# Patient Record
Sex: Male | Born: 1945 | Race: White | Hispanic: No | Marital: Married | Smoking: Former smoker
Health system: Southern US, Community
[De-identification: ages and names within clinical notes are randomized; demographics above are authoritative.]

## PROBLEM LIST (undated history)

## (undated) ENCOUNTER — Telehealth

## (undated) ENCOUNTER — Ambulatory Visit

## (undated) ENCOUNTER — Ambulatory Visit: Payer: Medicare (Managed Care)

## (undated) ENCOUNTER — Encounter

## (undated) ENCOUNTER — Ambulatory Visit: Payer: PRIVATE HEALTH INSURANCE

## (undated) ENCOUNTER — Encounter: Attending: Dermatology | Primary: Dermatology

## (undated) ENCOUNTER — Ambulatory Visit: Attending: Dermatology | Primary: Dermatology

## (undated) ENCOUNTER — Telehealth: Attending: Dermatology | Primary: Dermatology

## (undated) ENCOUNTER — Ambulatory Visit: Payer: PRIVATE HEALTH INSURANCE | Attending: Dermatology | Primary: Dermatology

## (undated) ENCOUNTER — Ambulatory Visit
Attending: Student in an Organized Health Care Education/Training Program | Primary: Student in an Organized Health Care Education/Training Program

## (undated) ENCOUNTER — Encounter: Attending: Hematology & Oncology | Primary: Hematology & Oncology

## (undated) ENCOUNTER — Encounter: Attending: Oncology | Primary: Oncology

## (undated) ENCOUNTER — Ambulatory Visit: Payer: Medicare (Managed Care) | Attending: Dermatology | Primary: Dermatology

## (undated) ENCOUNTER — Encounter
Attending: Student in an Organized Health Care Education/Training Program | Primary: Student in an Organized Health Care Education/Training Program

## (undated) ENCOUNTER — Ambulatory Visit: Payer: MEDICARE | Attending: Dermatology | Primary: Dermatology

## (undated) ENCOUNTER — Ambulatory Visit: Payer: Medicare (Managed Care) | Attending: Hematology & Oncology | Primary: Hematology & Oncology

## (undated) DIAGNOSIS — F419 Anxiety disorder, unspecified: Secondary | ICD-10-CM

## (undated) DIAGNOSIS — C801 Malignant (primary) neoplasm, unspecified: Secondary | ICD-10-CM

## (undated) DIAGNOSIS — I2510 Atherosclerotic heart disease of native coronary artery without angina pectoris: Secondary | ICD-10-CM

## (undated) DIAGNOSIS — G4730 Sleep apnea, unspecified: Secondary | ICD-10-CM

## (undated) DIAGNOSIS — M1990 Unspecified osteoarthritis, unspecified site: Secondary | ICD-10-CM

## (undated) DIAGNOSIS — J189 Pneumonia, unspecified organism: Secondary | ICD-10-CM

## (undated) DIAGNOSIS — E7800 Pure hypercholesterolemia, unspecified: Secondary | ICD-10-CM

## (undated) DIAGNOSIS — C8400 Mycosis fungoides, unspecified site: Secondary | ICD-10-CM

## (undated) DIAGNOSIS — I10 Essential (primary) hypertension: Secondary | ICD-10-CM

## (undated) DIAGNOSIS — K219 Gastro-esophageal reflux disease without esophagitis: Secondary | ICD-10-CM

## (undated) HISTORY — DX: Pure hypercholesterolemia, unspecified: E78.00

## (undated) HISTORY — DX: Gastro-esophageal reflux disease without esophagitis: K21.9

## (undated) HISTORY — PX: CARDIAC CATHETERIZATION: SHX172

## (undated) HISTORY — PX: BACK SURGERY: SHX140

## (undated) HISTORY — DX: Essential (primary) hypertension: I10

## (undated) HISTORY — DX: Atherosclerotic heart disease of native coronary artery without angina pectoris: I25.10

## (undated) HISTORY — PX: CORONARY ANGIOPLASTY WITH STENT PLACEMENT: SHX49

## (undated) HISTORY — DX: Unspecified osteoarthritis, unspecified site: M19.90

## (undated) HISTORY — PX: EYE SURGERY: SHX253

## (undated) HISTORY — PX: TONSILLECTOMY: SUR1361

---

## 1993-09-28 HISTORY — PX: OTHER SURGICAL HISTORY: SHX169

## 2006-03-18 ENCOUNTER — Ambulatory Visit: Payer: Self-pay | Admitting: Internal Medicine

## 2007-09-13 ENCOUNTER — Ambulatory Visit: Payer: Self-pay | Admitting: Gastroenterology

## 2007-09-29 HISTORY — PX: LUMBAR FUSION: SHX111

## 2008-04-18 ENCOUNTER — Emergency Department: Payer: Self-pay | Admitting: Emergency Medicine

## 2009-06-15 ENCOUNTER — Other Ambulatory Visit: Payer: Self-pay | Admitting: Internal Medicine

## 2009-09-17 ENCOUNTER — Encounter: Admission: RE | Admit: 2009-09-17 | Discharge: 2009-09-17 | Payer: Self-pay | Admitting: Neurological Surgery

## 2009-10-28 ENCOUNTER — Inpatient Hospital Stay (HOSPITAL_COMMUNITY): Admission: RE | Admit: 2009-10-28 | Discharge: 2009-10-31 | Payer: Self-pay | Admitting: Neurological Surgery

## 2009-10-28 HISTORY — PX: LUMBAR FUSION: SHX111

## 2010-01-04 ENCOUNTER — Encounter: Admission: RE | Admit: 2010-01-04 | Discharge: 2010-01-04 | Payer: Self-pay | Admitting: Sports Medicine

## 2010-12-15 LAB — BASIC METABOLIC PANEL
BUN: 16 mg/dL (ref 6–23)
CO2: 30 mEq/L (ref 19–32)
Creatinine, Ser: 1.03 mg/dL (ref 0.4–1.5)
GFR calc Af Amer: >60 mL/min (ref >60)
Glucose, Bld: 46 mg/dL — ABNORMAL LOW (ref 70–99)
Sodium: 142 mEq/L (ref 135–145)

## 2010-12-15 LAB — TYPE AND SCREEN: ABO/RH(D): O NEG

## 2010-12-15 LAB — CBC
HCT: 43.9 % (ref 39.0–52.0)
RBC: 4.48 MIL/uL (ref 4.22–5.81)
WBC: 7 10*3/uL (ref 4.0–10.5)

## 2010-12-15 LAB — ABO/RH: ABO/RH(D): O NEG

## 2011-02-24 ENCOUNTER — Observation Stay: Payer: Self-pay | Admitting: Internal Medicine

## 2011-04-28 ENCOUNTER — Encounter: Payer: Self-pay | Admitting: Podiatry

## 2011-04-28 DIAGNOSIS — M722 Plantar fascial fibromatosis: Secondary | ICD-10-CM

## 2011-07-03 ENCOUNTER — Ambulatory Visit: Payer: Self-pay | Admitting: Internal Medicine

## 2012-05-03 DIAGNOSIS — Z9889 Other specified postprocedural states: Secondary | ICD-10-CM | POA: Insufficient documentation

## 2012-05-03 DIAGNOSIS — M4322 Fusion of spine, cervical region: Secondary | ICD-10-CM | POA: Insufficient documentation

## 2012-05-03 DIAGNOSIS — Z955 Presence of coronary angioplasty implant and graft: Secondary | ICD-10-CM | POA: Insufficient documentation

## 2012-05-03 DIAGNOSIS — E785 Hyperlipidemia, unspecified: Secondary | ICD-10-CM | POA: Insufficient documentation

## 2012-05-24 DIAGNOSIS — C8408 Mycosis fungoides, lymph nodes of multiple sites: Secondary | ICD-10-CM | POA: Insufficient documentation

## 2012-05-24 DIAGNOSIS — C8400 Mycosis fungoides, unspecified site: Secondary | ICD-10-CM | POA: Insufficient documentation

## 2014-02-20 ENCOUNTER — Ambulatory Visit: Payer: Self-pay | Admitting: Internal Medicine

## 2014-03-12 DIAGNOSIS — M1990 Unspecified osteoarthritis, unspecified site: Secondary | ICD-10-CM | POA: Insufficient documentation

## 2014-09-10 ENCOUNTER — Ambulatory Visit: Payer: Self-pay | Admitting: Internal Medicine

## 2014-11-08 ENCOUNTER — Ambulatory Visit: Payer: Self-pay | Admitting: Gastroenterology

## 2014-12-25 ENCOUNTER — Ambulatory Visit: Payer: Self-pay | Admitting: Gastroenterology

## 2015-01-21 LAB — SURGICAL PATHOLOGY

## 2015-02-20 DIAGNOSIS — I2510 Atherosclerotic heart disease of native coronary artery without angina pectoris: Secondary | ICD-10-CM | POA: Insufficient documentation

## 2015-04-24 ENCOUNTER — Ambulatory Visit: Payer: Self-pay | Admitting: Urology

## 2015-04-29 ENCOUNTER — Ambulatory Visit (INDEPENDENT_AMBULATORY_CARE_PROVIDER_SITE_OTHER): Payer: PPO | Admitting: Urology

## 2015-04-29 ENCOUNTER — Encounter: Payer: Self-pay | Admitting: Urology

## 2015-04-29 VITALS — BP 120/55 | HR 60 | Ht 68.0 in | Wt 207.4 lb

## 2015-04-29 DIAGNOSIS — N401 Enlarged prostate with lower urinary tract symptoms: Secondary | ICD-10-CM

## 2015-04-29 DIAGNOSIS — R312 Other microscopic hematuria: Secondary | ICD-10-CM

## 2015-04-29 DIAGNOSIS — N390 Urinary tract infection, site not specified: Secondary | ICD-10-CM | POA: Diagnosis not present

## 2015-04-29 DIAGNOSIS — N138 Other obstructive and reflux uropathy: Secondary | ICD-10-CM

## 2015-04-29 DIAGNOSIS — R3129 Other microscopic hematuria: Secondary | ICD-10-CM

## 2015-04-29 LAB — URINALYSIS, COMPLETE
Bilirubin, UA: NEGATIVE
Glucose, UA: NEGATIVE
Ketones, UA: NEGATIVE
Leukocytes, UA: NEGATIVE
Nitrite, UA: NEGATIVE
PROTEIN UA: NEGATIVE
RBC UA: NEGATIVE
Specific Gravity, UA: >1.03 — ABNORMAL HIGH (ref 1.005–1.030)
UUROB: 0.2 mg/dL (ref 0.2–1.0)
pH, UA: 5 (ref 5.0–7.5)

## 2015-04-29 LAB — MICROSCOPIC EXAMINATION: BACTERIA UA: NONE SEEN

## 2015-04-29 LAB — BLADDER SCAN AMB NON-IMAGING: SCAN RESULT: 0

## 2015-04-29 NOTE — Progress Notes (Signed)
04/29/2015 3:17 PM   Manuel Cook January 05, 1946 [id number]  Referring provider: No referring provider defined for this encounter.  Chief Complaint  Patient presents with  . Prostatitis    referral from Dr. Ginette Pitman    HPI: Manuel Cook is a 69 year old white male who had the sudden onset of intense dysuria 6 weeks ago.  He was also experiencing chills, frequency and urgency at that time. His urine had a foul-smelling odor to it. His nocturia increased from 2-3 times a night to up to 5 times a night. He was also having some urinary incontinence.  He denies any gross hematuria, fevers, nausea, vomiting or diarrhea.   He was seen at [facility] walk in clinic on 03/28/2015.  Patient was started on Cipro and urine was sent for culture. It grew out Escherichia coli which was sensitive to the Cipro. He completed 10 days of antibiotics.  He was still having frequency and mild dysuria, so he followed up with his PCP's office and given tamsulosin and cefuroxime.  He states he is 90% back to normal.  He had a similar symptomatology 5 years ago and he did not address it  immediately and he developed an acute prostatitis and spent 3 days in the hospital on IV antibiotics and he did not want to experience the same sequela.       IPSS      04/29/15 1500       International Prostate Symptom Score   How often have you had the sensation of not emptying your bladder? About half the time     How often have you had to urinate less than every two hours? Less than half the time     How often have you found you stopped and started again several times when you urinated? Less than 1 in 5 times     How often have you found it difficult to postpone urination? More than half the time     How often have you had a weak urinary stream? Less than half the time     How often have you had to strain to start urination? Not at All     How many times did you typically get up at night to urinate? 3 Times     Total IPSS Score 15      Quality of Life due to urinary symptoms   If you were to spend the rest of your life with your urinary condition just the way it is now how would you feel about that? Mixed        Score:  1-7 Mild 8-19 Moderate 20-35 Severe  PMH: Past Medical History  Diagnosis Date  . High cholesterol   . Hypertension   . Acid reflux   . Arthritis   . CAD (coronary artery disease)     stents    Surgical History: Past Surgical History  Procedure Laterality Date  . Lumbar fusion  2009  . Neck fusion  1995    Home Medications:    Medication List       This list is accurate as of: 04/29/15  3:17 PM.  Always use your most recent med list.               ALFUZOSIN HCL PO  Take by mouth.     aspirin 81 MG EC tablet  Take 81 mg by mouth daily.     ATENOLOL PO  Take by mouth.  cefUROXime 500 MG tablet  Commonly known as:  CEFTIN  Take by mouth.     clobetasol cream 0.05 %  Commonly known as:  TEMOVATE  Apply a thin film to lesion on abdomen every other day not using topical nitrogen mustard     Diclofenac-Misoprostol 75-0.2 MG Tbec  Take by mouth.     Fish Oil Oil  by Does not apply route.     LIPITOR PO  Take by mouth.     misoprostol 200 MCG tablet  Commonly known as:  CYTOTEC  Take by mouth.     Omega 3 1000 MG Caps  by Does not apply route.     tamsulosin 0.4 MG Caps capsule  Commonly known as:  FLOMAX  Take by mouth.     triamcinolone cream 0.1 %  Commonly known as:  KENALOG  Apply to lesions twice a day, 1 week off and repeat cycle as necessary        Allergies:  Allergies  Allergen Reactions  . Sulfa Antibiotics     Family History: Family History  Problem Relation Age of Onset  . Kidney failure Brother   . Heart attack Father     Brother  . Diabetes Mellitus II Brother   . Prostate cancer Neg Hx     Social History:  reports that he has quit smoking. He does not have any smokeless tobacco history on file. He reports that he drinks  alcohol. He reports that he does not use illicit drugs.  ROS: UROLOGY Frequent Urination?: Yes Hard to postpone urination?: Yes Burning/pain with urination?: Yes Get up at night to urinate?: Yes Leakage of urine?: Yes Urine stream starts and stops?: No Trouble starting stream?: No Do you have to strain to urinate?: No Blood in urine?: No Urinary tract infection?: Yes Sexually transmitted disease?: No Injury to kidneys or bladder?: No Painful intercourse?: No Weak stream?: No Erection problems?: Yes Penile pain?: No  Gastrointestinal Nausea?: No Vomiting?: No Indigestion/heartburn?: No Diarrhea?: Yes Constipation?: No  Constitutional Fever: No Night sweats?: No Weight loss?: No Fatigue?: No  Skin Skin rash/lesions?: No Itching?: No  Eyes Blurred vision?: No Double vision?: No  Ears/Nose/Throat Sore throat?: No Sinus problems?: No  Hematologic/Lymphatic Swollen glands?: No Easy bruising?: No  Cardiovascular Leg swelling?: No Chest pain?: No  Respiratory Cough?: No Shortness of breath?: No  Endocrine Excessive thirst?: No  Musculoskeletal Back pain?: No Joint pain?: No  Neurological Headaches?: No Dizziness?: No  Psychologic Depression?: No Anxiety?: No  Physical Exam: BP 120/55 mmHg  Pulse 60  Ht 5\' 8"  (1.727 m)  Wt 207 lb 6.4 oz (94.076 kg)  BMI 31.54 kg/m2  Constitutional:  Alert and oriented, No acute distress. HEENT: [place] AT, moist mucus membranes.  Trachea midline, no masses. Cardiovascular: No clubbing, cyanosis, or edema. Respiratory: Normal respiratory effort, no increased work of breathing. GI: Abdomen is soft, nontender, nondistended, no abdominal masses GU: No CVA tenderness. GU: Patient with circumcised phallus.  Urethral meatus is patent.  No penile discharge. No penile lesions or rashes. Scrotum without lesions, cysts, rashes and/or edema.  Testicles are located scrotally bilaterally. No masses are appreciated in the  testicles. Left and right epididymis are normal. Rectal: Patient with  normal sphincter tone. Perineum without scarring or rashes. No rectal masses are appreciated. Prostate is approximately 45 grams, no nodules are appreciated. Seminal vesicles are normal. Skin: No rashes, bruises or suspicious lesions. Lymph: No cervical or inguinal adenopathy. Neurologic: Grossly intact, no focal deficits,  moving all 4 extremities. Psychiatric: Normal mood and affect.  Laboratory Data: Results for orders placed or performed in visit on 04/29/15  CULTURE, URINE COMPREHENSIVE  Result Value Ref Range   Urine Culture, Comprehensive Final report    Result 1 Comment   GC/Chlamydia Probe Amp  Result Value Ref Range   Chlamydia trachomatis, NAA Negative Negative   Neisseria gonorrhoeae by PCR Negative Negative   PLEASE NOTE: Comment   Microscopic Examination  Result Value Ref Range   WBC, UA 0-5 0 -  5 /hpf   RBC, UA 0-2 0 -  2 /hpf   Epithelial Cells (non renal) 0-10 0 - 10 /hpf   Casts Present (A) None seen /lpf   Cast Type Hyaline casts N/A   Mucus, UA Present (A) Not Estab.   Bacteria, UA None seen None seen/Few  Urinalysis, Complete  Result Value Ref Range   Specific Gravity, UA >1.030 (H) 1.005 - 1.030   pH, UA 5.0 5.0 - 7.5   Color, UA Yellow Yellow   Appearance Ur Clear Clear   Leukocytes, UA Negative Negative   Protein, UA Negative Negative/Trace   Glucose, UA Negative Negative   Ketones, UA Negative Negative   RBC, UA Negative Negative   Bilirubin, UA Negative Negative   Urobilinogen, Ur 0.2 0.2 - 1.0 mg/dL   Nitrite, UA Negative Negative   Microscopic Examination See below:   BLADDER SCAN AMB NON-IMAGING  Result Value Ref Range   Scan Result 0    Lab Results  Component Value Date   WBC 7.0 10/23/2009   HGB 15.4 10/23/2009   HCT 43.9 10/23/2009   MCV 97.9 10/23/2009   PLT 162 10/23/2009    Lab Results  Component Value Date   CREATININE 1.03 10/23/2009    No results  found for: PSA  No results found for: TESTOSTERONE  No results found for: HGBA1C  Urinalysis No results found for: COLORURINE, APPEARANCEUR, LABSPEC, PHURINE, GLUCOSEU, HGBUR, BILIRUBINUR, KETONESUR, PROTEINUR, UROBILINOGEN, NITRITE, LEUKOCYTESUR  Pertinent Imaging:   Assessment & Plan:    1. UTI:   Patient had a pan sensitive UTI + for E.coli in June 2016.  His UA is unremarkable and he is feeling a 90% improvement.  - Urinalysis, Complete - CULTURE, URINE COMPREHENSIVE - GC/Chlamydia Probe Amp - BLADDER SCAN AMB NON-IMAGING  2. BPH with LUTS:   Patient's IPSS is 15/3.  His PVR is 0 mL.  He does not want to continue the tamsulosin because of the sexual side effects he experienced while on the medication.    I will have him return in 3 months for a IPSS, PVR and PSA to ensure his residual remains low without medication and his UA remains clear.  3. Microscopic hematuria:   Patient had 10-50 RBC's/hpf on 03/28/2015, but he had infection at that time.  His repeated UA on 04/10/2015 and today's was negative for micro heme.  We will continue to monitor and will check his UA when he returns in three months.   No Follow-up on file.  Zara Council, [facility] [address] [place], [place] [address] [phone]

## 2015-04-30 LAB — GC/CHLAMYDIA PROBE AMP
Chlamydia trachomatis, NAA: NEGATIVE
Neisseria gonorrhoeae by PCR: NEGATIVE

## 2015-05-01 DIAGNOSIS — N401 Enlarged prostate with lower urinary tract symptoms: Secondary | ICD-10-CM

## 2015-05-01 DIAGNOSIS — N138 Other obstructive and reflux uropathy: Secondary | ICD-10-CM | POA: Insufficient documentation

## 2015-05-01 DIAGNOSIS — N390 Urinary tract infection, site not specified: Secondary | ICD-10-CM | POA: Insufficient documentation

## 2015-05-01 DIAGNOSIS — R3129 Other microscopic hematuria: Secondary | ICD-10-CM | POA: Insufficient documentation

## 2015-05-01 LAB — CULTURE, URINE COMPREHENSIVE

## 2015-07-22 ENCOUNTER — Other Ambulatory Visit: Payer: PPO

## 2015-07-30 ENCOUNTER — Ambulatory Visit: Payer: PPO | Admitting: Urology

## 2015-07-31 ENCOUNTER — Ambulatory Visit: Payer: PPO | Admitting: Urology

## 2015-09-04 ENCOUNTER — Other Ambulatory Visit: Payer: Self-pay | Admitting: Internal Medicine

## 2015-09-04 DIAGNOSIS — R918 Other nonspecific abnormal finding of lung field: Secondary | ICD-10-CM

## 2015-09-11 ENCOUNTER — Ambulatory Visit
Admission: RE | Admit: 2015-09-11 | Discharge: 2015-09-11 | Disposition: A | Discharge status: Home / Self Care | Payer: PPO | Source: Ambulatory Visit | Attending: Internal Medicine | Admitting: Internal Medicine

## 2015-09-11 DIAGNOSIS — I2510 Atherosclerotic heart disease of native coronary artery without angina pectoris: Secondary | ICD-10-CM | POA: Insufficient documentation

## 2015-09-11 DIAGNOSIS — R918 Other nonspecific abnormal finding of lung field: Secondary | ICD-10-CM | POA: Diagnosis not present

## 2015-09-11 MED ORDER — IOHEXOL 300 MG/ML  SOLN
75.0000 mL | Freq: Once | INTRAMUSCULAR | Status: AC | PRN
  Administered 2015-09-11: 75 mL via INTRAVENOUS

## 2015-10-08 DIAGNOSIS — M9903 Segmental and somatic dysfunction of lumbar region: Secondary | ICD-10-CM | POA: Diagnosis not present

## 2015-10-08 DIAGNOSIS — M9905 Segmental and somatic dysfunction of pelvic region: Secondary | ICD-10-CM | POA: Diagnosis not present

## 2015-10-08 DIAGNOSIS — M5416 Radiculopathy, lumbar region: Secondary | ICD-10-CM | POA: Diagnosis not present

## 2015-10-08 DIAGNOSIS — M9902 Segmental and somatic dysfunction of thoracic region: Secondary | ICD-10-CM | POA: Diagnosis not present

## 2015-10-28 DIAGNOSIS — M9905 Segmental and somatic dysfunction of pelvic region: Secondary | ICD-10-CM | POA: Diagnosis not present

## 2015-10-28 DIAGNOSIS — M5416 Radiculopathy, lumbar region: Secondary | ICD-10-CM | POA: Diagnosis not present

## 2015-10-28 DIAGNOSIS — M9902 Segmental and somatic dysfunction of thoracic region: Secondary | ICD-10-CM | POA: Diagnosis not present

## 2015-10-28 DIAGNOSIS — M9903 Segmental and somatic dysfunction of lumbar region: Secondary | ICD-10-CM | POA: Diagnosis not present

## 2015-10-29 ENCOUNTER — Ambulatory Visit: Payer: PPO | Attending: Internal Medicine

## 2015-10-29 DIAGNOSIS — G4710 Hypersomnia, unspecified: Secondary | ICD-10-CM | POA: Diagnosis not present

## 2015-10-29 DIAGNOSIS — G4733 Obstructive sleep apnea (adult) (pediatric): Secondary | ICD-10-CM | POA: Insufficient documentation

## 2015-10-29 DIAGNOSIS — G4730 Sleep apnea, unspecified: Secondary | ICD-10-CM | POA: Diagnosis not present

## 2015-11-18 DIAGNOSIS — M9905 Segmental and somatic dysfunction of pelvic region: Secondary | ICD-10-CM | POA: Diagnosis not present

## 2015-11-18 DIAGNOSIS — M5416 Radiculopathy, lumbar region: Secondary | ICD-10-CM | POA: Diagnosis not present

## 2015-11-18 DIAGNOSIS — M9902 Segmental and somatic dysfunction of thoracic region: Secondary | ICD-10-CM | POA: Diagnosis not present

## 2015-11-18 DIAGNOSIS — M9903 Segmental and somatic dysfunction of lumbar region: Secondary | ICD-10-CM | POA: Diagnosis not present

## 2015-11-20 DIAGNOSIS — M1990 Unspecified osteoarthritis, unspecified site: Secondary | ICD-10-CM | POA: Diagnosis not present

## 2015-12-09 DIAGNOSIS — M9902 Segmental and somatic dysfunction of thoracic region: Secondary | ICD-10-CM | POA: Diagnosis not present

## 2015-12-09 DIAGNOSIS — M5416 Radiculopathy, lumbar region: Secondary | ICD-10-CM | POA: Diagnosis not present

## 2015-12-09 DIAGNOSIS — M9905 Segmental and somatic dysfunction of pelvic region: Secondary | ICD-10-CM | POA: Diagnosis not present

## 2015-12-09 DIAGNOSIS — M9903 Segmental and somatic dysfunction of lumbar region: Secondary | ICD-10-CM | POA: Diagnosis not present

## 2015-12-16 DIAGNOSIS — H25813 Combined forms of age-related cataract, bilateral: Secondary | ICD-10-CM | POA: Diagnosis not present

## 2015-12-20 DIAGNOSIS — H02833 Dermatochalasis of right eye, unspecified eyelid: Secondary | ICD-10-CM | POA: Diagnosis not present

## 2015-12-20 DIAGNOSIS — H02836 Dermatochalasis of left eye, unspecified eyelid: Secondary | ICD-10-CM | POA: Diagnosis not present

## 2015-12-20 DIAGNOSIS — H18413 Arcus senilis, bilateral: Secondary | ICD-10-CM | POA: Diagnosis not present

## 2015-12-20 DIAGNOSIS — H2513 Age-related nuclear cataract, bilateral: Secondary | ICD-10-CM | POA: Diagnosis not present

## 2015-12-30 DIAGNOSIS — M9903 Segmental and somatic dysfunction of lumbar region: Secondary | ICD-10-CM | POA: Diagnosis not present

## 2015-12-30 DIAGNOSIS — M5416 Radiculopathy, lumbar region: Secondary | ICD-10-CM | POA: Diagnosis not present

## 2015-12-30 DIAGNOSIS — M9902 Segmental and somatic dysfunction of thoracic region: Secondary | ICD-10-CM | POA: Diagnosis not present

## 2015-12-30 DIAGNOSIS — M9905 Segmental and somatic dysfunction of pelvic region: Secondary | ICD-10-CM | POA: Diagnosis not present

## 2016-01-21 DIAGNOSIS — M9903 Segmental and somatic dysfunction of lumbar region: Secondary | ICD-10-CM | POA: Diagnosis not present

## 2016-01-21 DIAGNOSIS — M9905 Segmental and somatic dysfunction of pelvic region: Secondary | ICD-10-CM | POA: Diagnosis not present

## 2016-01-21 DIAGNOSIS — M9902 Segmental and somatic dysfunction of thoracic region: Secondary | ICD-10-CM | POA: Diagnosis not present

## 2016-01-21 DIAGNOSIS — M5416 Radiculopathy, lumbar region: Secondary | ICD-10-CM | POA: Diagnosis not present

## 2016-02-03 DIAGNOSIS — H2511 Age-related nuclear cataract, right eye: Secondary | ICD-10-CM | POA: Diagnosis not present

## 2016-02-03 DIAGNOSIS — Z961 Presence of intraocular lens: Secondary | ICD-10-CM | POA: Diagnosis not present

## 2016-02-03 DIAGNOSIS — H25811 Combined forms of age-related cataract, right eye: Secondary | ICD-10-CM | POA: Diagnosis not present

## 2016-02-04 DIAGNOSIS — H2512 Age-related nuclear cataract, left eye: Secondary | ICD-10-CM | POA: Diagnosis not present

## 2016-02-10 DIAGNOSIS — M9905 Segmental and somatic dysfunction of pelvic region: Secondary | ICD-10-CM | POA: Diagnosis not present

## 2016-02-10 DIAGNOSIS — M9902 Segmental and somatic dysfunction of thoracic region: Secondary | ICD-10-CM | POA: Diagnosis not present

## 2016-02-10 DIAGNOSIS — M5416 Radiculopathy, lumbar region: Secondary | ICD-10-CM | POA: Diagnosis not present

## 2016-02-10 DIAGNOSIS — M9903 Segmental and somatic dysfunction of lumbar region: Secondary | ICD-10-CM | POA: Diagnosis not present

## 2016-02-24 DIAGNOSIS — H25812 Combined forms of age-related cataract, left eye: Secondary | ICD-10-CM | POA: Diagnosis not present

## 2016-02-24 DIAGNOSIS — H2512 Age-related nuclear cataract, left eye: Secondary | ICD-10-CM | POA: Diagnosis not present

## 2016-02-24 DIAGNOSIS — Z961 Presence of intraocular lens: Secondary | ICD-10-CM | POA: Diagnosis not present

## 2016-03-03 DIAGNOSIS — I2510 Atherosclerotic heart disease of native coronary artery without angina pectoris: Secondary | ICD-10-CM | POA: Diagnosis not present

## 2016-03-03 DIAGNOSIS — R5383 Other fatigue: Secondary | ICD-10-CM | POA: Diagnosis not present

## 2016-03-03 DIAGNOSIS — C8400 Mycosis fungoides, unspecified site: Secondary | ICD-10-CM | POA: Diagnosis not present

## 2016-03-03 DIAGNOSIS — E784 Other hyperlipidemia: Secondary | ICD-10-CM | POA: Diagnosis not present

## 2016-03-03 DIAGNOSIS — R918 Other nonspecific abnormal finding of lung field: Secondary | ICD-10-CM | POA: Diagnosis not present

## 2016-03-03 DIAGNOSIS — R400 Somnolence: Secondary | ICD-10-CM | POA: Diagnosis not present

## 2016-03-03 DIAGNOSIS — M1990 Unspecified osteoarthritis, unspecified site: Secondary | ICD-10-CM | POA: Diagnosis not present

## 2016-03-09 DIAGNOSIS — M5416 Radiculopathy, lumbar region: Secondary | ICD-10-CM | POA: Diagnosis not present

## 2016-03-09 DIAGNOSIS — M9902 Segmental and somatic dysfunction of thoracic region: Secondary | ICD-10-CM | POA: Diagnosis not present

## 2016-03-09 DIAGNOSIS — M9903 Segmental and somatic dysfunction of lumbar region: Secondary | ICD-10-CM | POA: Diagnosis not present

## 2016-03-09 DIAGNOSIS — M9905 Segmental and somatic dysfunction of pelvic region: Secondary | ICD-10-CM | POA: Diagnosis not present

## 2016-03-10 DIAGNOSIS — I2510 Atherosclerotic heart disease of native coronary artery without angina pectoris: Secondary | ICD-10-CM | POA: Diagnosis not present

## 2016-03-10 DIAGNOSIS — Z0000 Encounter for general adult medical examination without abnormal findings: Secondary | ICD-10-CM | POA: Diagnosis not present

## 2016-03-10 DIAGNOSIS — E784 Other hyperlipidemia: Secondary | ICD-10-CM | POA: Diagnosis not present

## 2016-03-10 DIAGNOSIS — M1990 Unspecified osteoarthritis, unspecified site: Secondary | ICD-10-CM | POA: Diagnosis not present

## 2016-03-10 DIAGNOSIS — C8400 Mycosis fungoides, unspecified site: Secondary | ICD-10-CM | POA: Diagnosis not present

## 2016-03-30 DIAGNOSIS — Z961 Presence of intraocular lens: Secondary | ICD-10-CM | POA: Diagnosis not present

## 2016-04-06 DIAGNOSIS — M5416 Radiculopathy, lumbar region: Secondary | ICD-10-CM | POA: Diagnosis not present

## 2016-04-06 DIAGNOSIS — M9905 Segmental and somatic dysfunction of pelvic region: Secondary | ICD-10-CM | POA: Diagnosis not present

## 2016-04-06 DIAGNOSIS — M9903 Segmental and somatic dysfunction of lumbar region: Secondary | ICD-10-CM | POA: Diagnosis not present

## 2016-04-06 DIAGNOSIS — M9902 Segmental and somatic dysfunction of thoracic region: Secondary | ICD-10-CM | POA: Diagnosis not present

## 2016-04-10 DIAGNOSIS — M5416 Radiculopathy, lumbar region: Secondary | ICD-10-CM | POA: Diagnosis not present

## 2016-04-10 DIAGNOSIS — M9903 Segmental and somatic dysfunction of lumbar region: Secondary | ICD-10-CM | POA: Diagnosis not present

## 2016-04-10 DIAGNOSIS — M9905 Segmental and somatic dysfunction of pelvic region: Secondary | ICD-10-CM | POA: Diagnosis not present

## 2016-04-10 DIAGNOSIS — M9902 Segmental and somatic dysfunction of thoracic region: Secondary | ICD-10-CM | POA: Diagnosis not present

## 2016-04-13 DIAGNOSIS — M9905 Segmental and somatic dysfunction of pelvic region: Secondary | ICD-10-CM | POA: Diagnosis not present

## 2016-04-13 DIAGNOSIS — M9902 Segmental and somatic dysfunction of thoracic region: Secondary | ICD-10-CM | POA: Diagnosis not present

## 2016-04-13 DIAGNOSIS — M5416 Radiculopathy, lumbar region: Secondary | ICD-10-CM | POA: Diagnosis not present

## 2016-04-13 DIAGNOSIS — M9903 Segmental and somatic dysfunction of lumbar region: Secondary | ICD-10-CM | POA: Diagnosis not present

## 2016-04-27 DIAGNOSIS — M5416 Radiculopathy, lumbar region: Secondary | ICD-10-CM | POA: Diagnosis not present

## 2016-04-27 DIAGNOSIS — M9903 Segmental and somatic dysfunction of lumbar region: Secondary | ICD-10-CM | POA: Diagnosis not present

## 2016-04-27 DIAGNOSIS — M9902 Segmental and somatic dysfunction of thoracic region: Secondary | ICD-10-CM | POA: Diagnosis not present

## 2016-04-27 DIAGNOSIS — M9905 Segmental and somatic dysfunction of pelvic region: Secondary | ICD-10-CM | POA: Diagnosis not present

## 2016-04-30 DIAGNOSIS — M5416 Radiculopathy, lumbar region: Secondary | ICD-10-CM | POA: Diagnosis not present

## 2016-04-30 DIAGNOSIS — M9902 Segmental and somatic dysfunction of thoracic region: Secondary | ICD-10-CM | POA: Diagnosis not present

## 2016-04-30 DIAGNOSIS — M9903 Segmental and somatic dysfunction of lumbar region: Secondary | ICD-10-CM | POA: Diagnosis not present

## 2016-04-30 DIAGNOSIS — M9905 Segmental and somatic dysfunction of pelvic region: Secondary | ICD-10-CM | POA: Diagnosis not present

## 2016-05-04 DIAGNOSIS — M9905 Segmental and somatic dysfunction of pelvic region: Secondary | ICD-10-CM | POA: Diagnosis not present

## 2016-05-04 DIAGNOSIS — M9902 Segmental and somatic dysfunction of thoracic region: Secondary | ICD-10-CM | POA: Diagnosis not present

## 2016-05-04 DIAGNOSIS — M9903 Segmental and somatic dysfunction of lumbar region: Secondary | ICD-10-CM | POA: Diagnosis not present

## 2016-05-04 DIAGNOSIS — M5416 Radiculopathy, lumbar region: Secondary | ICD-10-CM | POA: Diagnosis not present

## 2016-06-02 DIAGNOSIS — M9902 Segmental and somatic dysfunction of thoracic region: Secondary | ICD-10-CM | POA: Diagnosis not present

## 2016-06-02 DIAGNOSIS — M5416 Radiculopathy, lumbar region: Secondary | ICD-10-CM | POA: Diagnosis not present

## 2016-06-02 DIAGNOSIS — M9903 Segmental and somatic dysfunction of lumbar region: Secondary | ICD-10-CM | POA: Diagnosis not present

## 2016-06-02 DIAGNOSIS — M9905 Segmental and somatic dysfunction of pelvic region: Secondary | ICD-10-CM | POA: Diagnosis not present

## 2016-06-10 DIAGNOSIS — M1990 Unspecified osteoarthritis, unspecified site: Secondary | ICD-10-CM | POA: Diagnosis not present

## 2016-06-29 DIAGNOSIS — M9903 Segmental and somatic dysfunction of lumbar region: Secondary | ICD-10-CM | POA: Diagnosis not present

## 2016-06-29 DIAGNOSIS — M9905 Segmental and somatic dysfunction of pelvic region: Secondary | ICD-10-CM | POA: Diagnosis not present

## 2016-06-29 DIAGNOSIS — M5416 Radiculopathy, lumbar region: Secondary | ICD-10-CM | POA: Diagnosis not present

## 2016-06-29 DIAGNOSIS — M9902 Segmental and somatic dysfunction of thoracic region: Secondary | ICD-10-CM | POA: Diagnosis not present

## 2016-07-21 DIAGNOSIS — I2510 Atherosclerotic heart disease of native coronary artery without angina pectoris: Secondary | ICD-10-CM | POA: Diagnosis not present

## 2016-07-21 DIAGNOSIS — E784 Other hyperlipidemia: Secondary | ICD-10-CM | POA: Diagnosis not present

## 2016-07-21 DIAGNOSIS — K219 Gastro-esophageal reflux disease without esophagitis: Secondary | ICD-10-CM | POA: Diagnosis not present

## 2016-07-21 DIAGNOSIS — M1990 Unspecified osteoarthritis, unspecified site: Secondary | ICD-10-CM | POA: Diagnosis not present

## 2016-07-21 DIAGNOSIS — R0602 Shortness of breath: Secondary | ICD-10-CM | POA: Diagnosis not present

## 2016-07-21 DIAGNOSIS — I208 Other forms of angina pectoris: Secondary | ICD-10-CM | POA: Diagnosis not present

## 2016-07-21 DIAGNOSIS — E669 Obesity, unspecified: Secondary | ICD-10-CM | POA: Diagnosis not present

## 2016-07-21 DIAGNOSIS — Z955 Presence of coronary angioplasty implant and graft: Secondary | ICD-10-CM | POA: Diagnosis not present

## 2016-07-28 DIAGNOSIS — M9903 Segmental and somatic dysfunction of lumbar region: Secondary | ICD-10-CM | POA: Diagnosis not present

## 2016-07-28 DIAGNOSIS — M9902 Segmental and somatic dysfunction of thoracic region: Secondary | ICD-10-CM | POA: Diagnosis not present

## 2016-07-28 DIAGNOSIS — M9905 Segmental and somatic dysfunction of pelvic region: Secondary | ICD-10-CM | POA: Diagnosis not present

## 2016-07-28 DIAGNOSIS — M5416 Radiculopathy, lumbar region: Secondary | ICD-10-CM | POA: Diagnosis not present

## 2016-08-10 DIAGNOSIS — M1990 Unspecified osteoarthritis, unspecified site: Secondary | ICD-10-CM | POA: Diagnosis not present

## 2016-08-10 DIAGNOSIS — R350 Frequency of micturition: Secondary | ICD-10-CM | POA: Diagnosis not present

## 2016-08-10 DIAGNOSIS — R194 Change in bowel habit: Secondary | ICD-10-CM | POA: Diagnosis not present

## 2016-08-10 DIAGNOSIS — I2510 Atherosclerotic heart disease of native coronary artery without angina pectoris: Secondary | ICD-10-CM | POA: Diagnosis not present

## 2016-08-10 DIAGNOSIS — E784 Other hyperlipidemia: Secondary | ICD-10-CM | POA: Diagnosis not present

## 2016-08-11 DIAGNOSIS — I208 Other forms of angina pectoris: Secondary | ICD-10-CM | POA: Diagnosis not present

## 2016-08-11 DIAGNOSIS — I2510 Atherosclerotic heart disease of native coronary artery without angina pectoris: Secondary | ICD-10-CM | POA: Diagnosis not present

## 2016-08-11 DIAGNOSIS — R0602 Shortness of breath: Secondary | ICD-10-CM | POA: Diagnosis not present

## 2016-08-24 DIAGNOSIS — M9905 Segmental and somatic dysfunction of pelvic region: Secondary | ICD-10-CM | POA: Diagnosis not present

## 2016-08-24 DIAGNOSIS — M9902 Segmental and somatic dysfunction of thoracic region: Secondary | ICD-10-CM | POA: Diagnosis not present

## 2016-08-24 DIAGNOSIS — M5416 Radiculopathy, lumbar region: Secondary | ICD-10-CM | POA: Diagnosis not present

## 2016-08-24 DIAGNOSIS — M9903 Segmental and somatic dysfunction of lumbar region: Secondary | ICD-10-CM | POA: Diagnosis not present

## 2016-09-02 DIAGNOSIS — Z0000 Encounter for general adult medical examination without abnormal findings: Secondary | ICD-10-CM | POA: Diagnosis not present

## 2016-09-02 DIAGNOSIS — E784 Other hyperlipidemia: Secondary | ICD-10-CM | POA: Diagnosis not present

## 2016-09-02 DIAGNOSIS — M1990 Unspecified osteoarthritis, unspecified site: Secondary | ICD-10-CM | POA: Diagnosis not present

## 2016-09-02 DIAGNOSIS — C8400 Mycosis fungoides, unspecified site: Secondary | ICD-10-CM | POA: Diagnosis not present

## 2016-09-02 DIAGNOSIS — Z125 Encounter for screening for malignant neoplasm of prostate: Secondary | ICD-10-CM | POA: Diagnosis not present

## 2016-09-02 DIAGNOSIS — I2510 Atherosclerotic heart disease of native coronary artery without angina pectoris: Secondary | ICD-10-CM | POA: Diagnosis not present

## 2016-09-09 DIAGNOSIS — E784 Other hyperlipidemia: Secondary | ICD-10-CM | POA: Diagnosis not present

## 2016-09-09 DIAGNOSIS — Z955 Presence of coronary angioplasty implant and graft: Secondary | ICD-10-CM | POA: Diagnosis not present

## 2016-09-09 DIAGNOSIS — I2510 Atherosclerotic heart disease of native coronary artery without angina pectoris: Secondary | ICD-10-CM | POA: Diagnosis not present

## 2016-09-09 DIAGNOSIS — R194 Change in bowel habit: Secondary | ICD-10-CM | POA: Diagnosis not present

## 2016-09-09 DIAGNOSIS — M1990 Unspecified osteoarthritis, unspecified site: Secondary | ICD-10-CM | POA: Diagnosis not present

## 2016-09-24 ENCOUNTER — Ambulatory Visit
Admission: RE | Admit: 2016-09-24 | Discharge: 2016-09-24 | Disposition: A | Discharge status: Home / Self Care | Payer: PPO | Source: Ambulatory Visit | Attending: Gastroenterology | Admitting: Gastroenterology

## 2016-09-24 ENCOUNTER — Other Ambulatory Visit: Payer: Self-pay | Admitting: Gastroenterology

## 2016-09-24 DIAGNOSIS — K5900 Constipation, unspecified: Secondary | ICD-10-CM

## 2016-09-24 DIAGNOSIS — Z1211 Encounter for screening for malignant neoplasm of colon: Secondary | ICD-10-CM | POA: Diagnosis not present

## 2016-10-12 DIAGNOSIS — M9903 Segmental and somatic dysfunction of lumbar region: Secondary | ICD-10-CM | POA: Diagnosis not present

## 2016-10-12 DIAGNOSIS — M9902 Segmental and somatic dysfunction of thoracic region: Secondary | ICD-10-CM | POA: Diagnosis not present

## 2016-10-12 DIAGNOSIS — M9905 Segmental and somatic dysfunction of pelvic region: Secondary | ICD-10-CM | POA: Diagnosis not present

## 2016-10-12 DIAGNOSIS — M5416 Radiculopathy, lumbar region: Secondary | ICD-10-CM | POA: Diagnosis not present

## 2016-11-09 DIAGNOSIS — M5416 Radiculopathy, lumbar region: Secondary | ICD-10-CM | POA: Diagnosis not present

## 2016-11-09 DIAGNOSIS — M9903 Segmental and somatic dysfunction of lumbar region: Secondary | ICD-10-CM | POA: Diagnosis not present

## 2016-11-09 DIAGNOSIS — M9905 Segmental and somatic dysfunction of pelvic region: Secondary | ICD-10-CM | POA: Diagnosis not present

## 2016-11-09 DIAGNOSIS — M9902 Segmental and somatic dysfunction of thoracic region: Secondary | ICD-10-CM | POA: Diagnosis not present

## 2016-12-14 DIAGNOSIS — M9903 Segmental and somatic dysfunction of lumbar region: Secondary | ICD-10-CM | POA: Diagnosis not present

## 2016-12-14 DIAGNOSIS — M9905 Segmental and somatic dysfunction of pelvic region: Secondary | ICD-10-CM | POA: Diagnosis not present

## 2016-12-14 DIAGNOSIS — M5416 Radiculopathy, lumbar region: Secondary | ICD-10-CM | POA: Diagnosis not present

## 2016-12-14 DIAGNOSIS — M9902 Segmental and somatic dysfunction of thoracic region: Secondary | ICD-10-CM | POA: Diagnosis not present

## 2016-12-21 ENCOUNTER — Encounter: Payer: Self-pay | Admitting: *Deleted

## 2016-12-22 ENCOUNTER — Ambulatory Visit: Payer: PPO | Admitting: Anesthesiology

## 2016-12-22 ENCOUNTER — Encounter
Admission: RE | Disposition: A | Discharge status: Home / Self Care | Payer: Self-pay | Source: Ambulatory Visit | Attending: Gastroenterology

## 2016-12-22 ENCOUNTER — Encounter: Payer: Self-pay | Admitting: *Deleted

## 2016-12-22 ENCOUNTER — Ambulatory Visit
Admission: RE | Admit: 2016-12-22 | Discharge: 2016-12-22 | Disposition: A | Discharge status: Home / Self Care | Payer: PPO | Source: Ambulatory Visit | Attending: Gastroenterology | Admitting: Gastroenterology

## 2016-12-22 DIAGNOSIS — Z87891 Personal history of nicotine dependence: Secondary | ICD-10-CM | POA: Insufficient documentation

## 2016-12-22 DIAGNOSIS — Z7982 Long term (current) use of aspirin: Secondary | ICD-10-CM | POA: Insufficient documentation

## 2016-12-22 DIAGNOSIS — M1990 Unspecified osteoarthritis, unspecified site: Secondary | ICD-10-CM | POA: Diagnosis not present

## 2016-12-22 DIAGNOSIS — Z955 Presence of coronary angioplasty implant and graft: Secondary | ICD-10-CM | POA: Diagnosis not present

## 2016-12-22 DIAGNOSIS — G4730 Sleep apnea, unspecified: Secondary | ICD-10-CM | POA: Diagnosis not present

## 2016-12-22 DIAGNOSIS — K5730 Diverticulosis of large intestine without perforation or abscess without bleeding: Secondary | ICD-10-CM | POA: Insufficient documentation

## 2016-12-22 DIAGNOSIS — K5790 Diverticulosis of intestine, part unspecified, without perforation or abscess without bleeding: Secondary | ICD-10-CM | POA: Diagnosis not present

## 2016-12-22 DIAGNOSIS — D126 Benign neoplasm of colon, unspecified: Secondary | ICD-10-CM | POA: Insufficient documentation

## 2016-12-22 DIAGNOSIS — D124 Benign neoplasm of descending colon: Secondary | ICD-10-CM | POA: Insufficient documentation

## 2016-12-22 DIAGNOSIS — Z1211 Encounter for screening for malignant neoplasm of colon: Secondary | ICD-10-CM | POA: Insufficient documentation

## 2016-12-22 DIAGNOSIS — Z79899 Other long term (current) drug therapy: Secondary | ICD-10-CM | POA: Diagnosis not present

## 2016-12-22 DIAGNOSIS — E7800 Pure hypercholesterolemia, unspecified: Secondary | ICD-10-CM | POA: Insufficient documentation

## 2016-12-22 DIAGNOSIS — K219 Gastro-esophageal reflux disease without esophagitis: Secondary | ICD-10-CM | POA: Diagnosis not present

## 2016-12-22 DIAGNOSIS — I2510 Atherosclerotic heart disease of native coronary artery without angina pectoris: Secondary | ICD-10-CM | POA: Diagnosis not present

## 2016-12-22 DIAGNOSIS — D120 Benign neoplasm of cecum: Secondary | ICD-10-CM | POA: Diagnosis not present

## 2016-12-22 DIAGNOSIS — I10 Essential (primary) hypertension: Secondary | ICD-10-CM | POA: Diagnosis not present

## 2016-12-22 DIAGNOSIS — K635 Polyp of colon: Secondary | ICD-10-CM | POA: Diagnosis not present

## 2016-12-22 HISTORY — PX: COLONOSCOPY WITH PROPOFOL: SHX5780

## 2016-12-22 SURGERY — COLONOSCOPY WITH PROPOFOL
Anesthesia: General

## 2016-12-22 MED ORDER — FENTANYL CITRATE (PF) 100 MCG/2ML IJ SOLN
INTRAMUSCULAR | Status: DC | PRN
Start: 2016-12-22 — End: 2016-12-22
  Administered 2016-12-22: 50 ug via INTRAVENOUS

## 2016-12-22 MED ORDER — PROPOFOL 10 MG/ML IV BOLUS
INTRAVENOUS | Status: DC | PRN
  Administered 2016-12-22: 100 mg via INTRAVENOUS

## 2016-12-22 MED ORDER — MIDAZOLAM HCL 2 MG/2ML IJ SOLN
INTRAMUSCULAR | Status: AC
  Filled 2016-12-22: qty 2

## 2016-12-22 MED ORDER — PROPOFOL 500 MG/50ML IV EMUL
INTRAVENOUS | Status: DC | PRN
  Administered 2016-12-22: 140 ug/kg/min via INTRAVENOUS

## 2016-12-22 MED ORDER — LIDOCAINE 2% (20 MG/ML) 5 ML SYRINGE
INTRAMUSCULAR | Status: DC | PRN
  Administered 2016-12-22: 40 mg via INTRAVENOUS

## 2016-12-22 MED ORDER — FENTANYL CITRATE (PF) 100 MCG/2ML IJ SOLN
INTRAMUSCULAR | Status: AC
  Filled 2016-12-22: qty 2

## 2016-12-22 MED ORDER — SODIUM CHLORIDE 0.9 % IV SOLN: INTRAVENOUS | Status: DC

## 2016-12-22 MED ORDER — LIDOCAINE HCL (PF) 2 % IJ SOLN
INTRAMUSCULAR | Status: AC
  Filled 2016-12-22: qty 2

## 2016-12-22 MED ORDER — MIDAZOLAM HCL 5 MG/5ML IJ SOLN
INTRAMUSCULAR | Status: DC | PRN
  Administered 2016-12-22: 1 mg via INTRAVENOUS

## 2016-12-22 MED ORDER — SODIUM CHLORIDE 0.9 % IV SOLN
INTRAVENOUS | Status: DC
  Administered 2016-12-22 (×2): via INTRAVENOUS

## 2016-12-22 MED ORDER — PROPOFOL 500 MG/50ML IV EMUL
INTRAVENOUS | Status: AC
  Filled 2016-12-22: qty 100

## 2016-12-22 NOTE — Anesthesia Postprocedure Evaluation (Signed)
Anesthesia Post Note  Patient: Manuel Cook  Procedure(s) Performed: Procedure(s) (LRB): COLONOSCOPY WITH PROPOFOL (N/A)  Patient location during evaluation: PACU Anesthesia Type: General Level of consciousness: awake and alert and oriented Pain management: pain level controlled Vital Signs Assessment: post-procedure vital signs reviewed and stable Respiratory status: spontaneous breathing Cardiovascular status: blood pressure returned to baseline Anesthetic complications: no     Last Vitals:  Vitals:   12/22/16 1535 12/22/16 1545  BP: 131/87 (!) 162/79  Pulse:    Resp:    Temp:      Last Pain:  Vitals:   12/22/16 1515  TempSrc: Tympanic  PainSc:                  Hektor Huston

## 2016-12-22 NOTE — Anesthesia Post-op Follow-up Note (Cosign Needed)
Anesthesia QCDR form completed.        

## 2016-12-22 NOTE — H&P (Signed)
Outpatient short stay form Pre-procedure 12/22/2016 1:47 PM Lollie Sails MD  Primary Physician: Dr Tracie Harrier  Reason for visit:  Screening colonoscopy  History of present illness:  Patient is a 71 year old Caucasian male presenting today as above. He tolerated his prep well. He takes no blood thinning agents except He does take 81 mg aspirin that has been held for several days. It has been over 10 years since his last colonoscopy.    Current Facility-Administered Medications:  .  0.9 %  sodium chloride infusion, , Intravenous, Continuous, Lollie Sails, MD, Last Rate: 20 mL/hr at 12/22/16 1308 .  0.9 %  sodium chloride infusion, , Intravenous, Continuous, Lollie Sails, MD  Prescriptions Prior to Admission  Medication Sig Dispense Refill Last Dose  . diclofenac (VOLTAREN) 75 MG EC tablet Take 75 mg by mouth 2 (two) times daily.   12/20/2016  . PROBIOTIC PRODUCT PO Take by mouth.   12/20/2016  . aspirin 81 MG EC tablet Take 81 mg by mouth daily.     12/19/2016  . ATENOLOL PO Take by mouth.     12/20/2016  . Atorvastatin Calcium (LIPITOR PO) Take by mouth.     12/20/2016  . clobetasol cream (TEMOVATE) 0.05 % Apply a thin film to lesion on abdomen every other day not using topical nitrogen mustard   Not Taking  . misoprostol (CYTOTEC) 200 MCG tablet Take by mouth.   12/20/2016     Allergies  Allergen Reactions  . Sulfa Antibiotics      Past Medical History:  Diagnosis Date  . Acid reflux   . Arthritis   . CAD (coronary artery disease)    stents  . High cholesterol   . Hypertension     Review of systems:      Physical Exam    Heart and lungs: Regular rate and rhythm without rub or gallop, lungs are bilaterally clear.    HEENT: Normocephalic atraumatic eyes are anicteric    Other:     Pertinant exam for procedure: Soft nontender nondistended bowel sounds positive normoactive.    Planned proceedures: Colonoscopy and indicated procedures. I have  discussed the risks benefits and complications of procedures to include not limited to bleeding, infection, perforation and the risk of sedation and the patient wishes to proceed.    Lollie Sails, MD Gastroenterology 12/22/2016  1:47 PM

## 2016-12-22 NOTE — Op Note (Signed)
[facility] Gastroenterology Patient Name: Manuel Cook Procedure Date: 12/22/2016 2:27 PM MRN: [id number] Account #: [id number] Date of Birth: 1946/05/05 Admit Type: Outpatient Age: 71 Room: [facility] ENDO ROOM 3 Gender: Male Note Status: Finalized Procedure:            Colonoscopy Indications:          Screening for colorectal malignant neoplasm Providers:            Lollie Sails, MD Referring MD:         Tracie Harrier, MD (Referring MD) Medicines:            Monitored Anesthesia Care Complications:        No immediate complications. Procedure:            Pre-Anesthesia Assessment:                       - ASA Grade Assessment: III - A patient with severe                        systemic disease.                       After obtaining informed consent, the colonoscope was                        passed under direct vision. Throughout the procedure,                        the patient's blood pressure, pulse, and oxygen                        saturations were monitored continuously. The                        Colonoscope was introduced through the anus and                        advanced to the the cecum, identified by appendiceal                        orifice and ileocecal valve. The colonoscopy was                        performed without difficulty. The patient tolerated the                        procedure well. Findings:      A few small-mouthed diverticula were found in the sigmoid colon,       descending colon and transverse colon.      A 2 mm polyp was found in the ileocecal valve. The polyp was sessile.       The polyp was removed with a cold biopsy forceps. Resection and       retrieval were complete.      A 4 mm polyp was found in the descending colon. The polyp was sessile.       The polyp was removed with a cold snare. Resection and retrieval were       complete.      A 3 mm polyp was found in the descending colon. The polyp was sessile.       The  polyp was removed with a cold snare. Resection and retrieval were       complete.      The digital rectal exam was normal. Impression:           - Diverticulosis in the sigmoid colon, in the                        descending colon and in the transverse colon.                       - One 2 mm polyp at the ileocecal valve, removed with a                        cold biopsy forceps. Resected and retrieved.                       - One 4 mm polyp in the descending colon, removed with                        a cold snare. Resected and retrieved.                       - One 3 mm polyp in the descending colon, removed with                        a cold snare. Resected and retrieved. Recommendation:       - Discharge patient to home.                       - Soft diet for 2 days, then advance as tolerated to                        advance diet as tolerated. Procedure Code(s):    --- Professional ---                       [phone], Colonoscopy, flexible; with removal of tumor(s),                        polyp(s), or other lesion(s) by snare technique                       45380, 76, Colonoscopy, flexible; with biopsy, single                        or multiple Diagnosis Code(s):    --- Professional ---                       Z12.11, Encounter for screening for malignant neoplasm                        of colon                       D12.0, Benign neoplasm of cecum                       D12.4, Benign neoplasm of descending colon                       K57.30, Diverticulosis of  large intestine without                        perforation or abscess without bleeding CPT copyright 2016 American Medical Association. All rights reserved. The codes documented in this report are preliminary and upon coder review may  be revised to meet current compliance requirements. Lollie Sails, MD 12/22/2016 3:16:00 PM This report has been signed electronically. Number of Addenda: 0 Note Initiated On: 12/22/2016 2:27  PM Scope Withdrawal Time: 0 hours 15 minutes 34 seconds  Total Procedure Duration: 0 hours 27 minutes 5 seconds       [facility]

## 2016-12-22 NOTE — Transfer of Care (Signed)
Immediate Anesthesia Transfer of Care Note  Patient: Manuel Cook  Procedure(s) Performed: Procedure(s): COLONOSCOPY WITH PROPOFOL (N/A)  Patient Location: PACU  Anesthesia Type:General  Level of Consciousness: sedated  Airway & Oxygen Therapy: Patient Spontanous Breathing and Patient connected to nasal cannula oxygen  Post-op Assessment: Report given to RN and Post -op Vital signs reviewed and stable  Post vital signs: Reviewed and stable  Last Vitals:  Vitals:   12/22/16 1246 12/22/16 1515  BP: (!) 156/64 (!) 100/57  Pulse: 64   Resp: 14 20  Temp: (!) 35.7 C (!) 35.9 C    Last Pain:  Vitals:   12/22/16 1515  TempSrc: Tympanic  PainSc:          Complications: No apparent anesthesia complications

## 2016-12-22 NOTE — Anesthesia Preprocedure Evaluation (Signed)
Anesthesia Evaluation  Patient identified by MRN, date of birth, ID band Patient awake    Reviewed: Allergy & Precautions, NPO status , Patient's Chart, lab work & pertinent test results  Airway Mallampati: III       Dental  (+) Upper Dentures   Pulmonary neg pulmonary ROS, sleep apnea , former smoker,     + decreased breath sounds      Cardiovascular hypertension, Pt. on home beta blockers + CAD and + Cardiac Stents   Rhythm:Regular     Neuro/Psych    GI/Hepatic Neg liver ROS, GERD  ,  Endo/Other  negative endocrine ROS  Renal/GU negative Renal ROS     Musculoskeletal   Abdominal (+) + obese,   Peds  Hematology negative hematology ROS (+)   Anesthesia Other Findings   Reproductive/Obstetrics                             Anesthesia Physical Anesthesia Plan  ASA: III  Anesthesia Plan: General   Post-op Pain Management:    Induction: Intravenous  Airway Management Planned: Natural Airway and Nasal Cannula  Additional Equipment:   Intra-op Plan:   Post-operative Plan:   Informed Consent: I have reviewed the patients History and Physical, chart, labs and discussed the procedure including the risks, benefits and alternatives for the proposed anesthesia with the patient or authorized representative who has indicated his/her understanding and acceptance.     Plan Discussed with: CRNA  Anesthesia Plan Comments:         Anesthesia Quick Evaluation

## 2016-12-24 ENCOUNTER — Encounter: Payer: Self-pay | Admitting: Gastroenterology

## 2016-12-25 LAB — SURGICAL PATHOLOGY

## 2017-01-11 DIAGNOSIS — M9902 Segmental and somatic dysfunction of thoracic region: Secondary | ICD-10-CM | POA: Diagnosis not present

## 2017-01-11 DIAGNOSIS — M9905 Segmental and somatic dysfunction of pelvic region: Secondary | ICD-10-CM | POA: Diagnosis not present

## 2017-01-11 DIAGNOSIS — M9903 Segmental and somatic dysfunction of lumbar region: Secondary | ICD-10-CM | POA: Diagnosis not present

## 2017-01-11 DIAGNOSIS — M5416 Radiculopathy, lumbar region: Secondary | ICD-10-CM | POA: Diagnosis not present

## 2017-02-08 DIAGNOSIS — M9903 Segmental and somatic dysfunction of lumbar region: Secondary | ICD-10-CM | POA: Diagnosis not present

## 2017-02-08 DIAGNOSIS — M5416 Radiculopathy, lumbar region: Secondary | ICD-10-CM | POA: Diagnosis not present

## 2017-02-08 DIAGNOSIS — M9902 Segmental and somatic dysfunction of thoracic region: Secondary | ICD-10-CM | POA: Diagnosis not present

## 2017-02-08 DIAGNOSIS — M9905 Segmental and somatic dysfunction of pelvic region: Secondary | ICD-10-CM | POA: Diagnosis not present

## 2017-03-09 DIAGNOSIS — M9902 Segmental and somatic dysfunction of thoracic region: Secondary | ICD-10-CM | POA: Diagnosis not present

## 2017-03-09 DIAGNOSIS — M5416 Radiculopathy, lumbar region: Secondary | ICD-10-CM | POA: Diagnosis not present

## 2017-03-09 DIAGNOSIS — M9905 Segmental and somatic dysfunction of pelvic region: Secondary | ICD-10-CM | POA: Diagnosis not present

## 2017-03-09 DIAGNOSIS — M9903 Segmental and somatic dysfunction of lumbar region: Secondary | ICD-10-CM | POA: Diagnosis not present

## 2017-03-11 DIAGNOSIS — N390 Urinary tract infection, site not specified: Secondary | ICD-10-CM | POA: Diagnosis not present

## 2017-03-11 DIAGNOSIS — R300 Dysuria: Secondary | ICD-10-CM | POA: Diagnosis not present

## 2017-03-29 DIAGNOSIS — R194 Change in bowel habit: Secondary | ICD-10-CM | POA: Diagnosis not present

## 2017-03-29 DIAGNOSIS — M1990 Unspecified osteoarthritis, unspecified site: Secondary | ICD-10-CM | POA: Diagnosis not present

## 2017-03-29 DIAGNOSIS — Z955 Presence of coronary angioplasty implant and graft: Secondary | ICD-10-CM | POA: Diagnosis not present

## 2017-03-29 DIAGNOSIS — I2510 Atherosclerotic heart disease of native coronary artery without angina pectoris: Secondary | ICD-10-CM | POA: Diagnosis not present

## 2017-03-29 DIAGNOSIS — E784 Other hyperlipidemia: Secondary | ICD-10-CM | POA: Diagnosis not present

## 2017-04-05 DIAGNOSIS — Z0000 Encounter for general adult medical examination without abnormal findings: Secondary | ICD-10-CM | POA: Diagnosis not present

## 2017-04-05 DIAGNOSIS — M9905 Segmental and somatic dysfunction of pelvic region: Secondary | ICD-10-CM | POA: Diagnosis not present

## 2017-04-05 DIAGNOSIS — E784 Other hyperlipidemia: Secondary | ICD-10-CM | POA: Diagnosis not present

## 2017-04-05 DIAGNOSIS — R350 Frequency of micturition: Secondary | ICD-10-CM | POA: Diagnosis not present

## 2017-04-05 DIAGNOSIS — M9902 Segmental and somatic dysfunction of thoracic region: Secondary | ICD-10-CM | POA: Diagnosis not present

## 2017-04-05 DIAGNOSIS — M9903 Segmental and somatic dysfunction of lumbar region: Secondary | ICD-10-CM | POA: Diagnosis not present

## 2017-04-05 DIAGNOSIS — M1990 Unspecified osteoarthritis, unspecified site: Secondary | ICD-10-CM | POA: Diagnosis not present

## 2017-04-05 DIAGNOSIS — M5416 Radiculopathy, lumbar region: Secondary | ICD-10-CM | POA: Diagnosis not present

## 2017-04-05 DIAGNOSIS — I2510 Atherosclerotic heart disease of native coronary artery without angina pectoris: Secondary | ICD-10-CM | POA: Diagnosis not present

## 2017-04-05 DIAGNOSIS — Z955 Presence of coronary angioplasty implant and graft: Secondary | ICD-10-CM | POA: Diagnosis not present

## 2017-04-19 DIAGNOSIS — R590 Localized enlarged lymph nodes: Secondary | ICD-10-CM | POA: Diagnosis not present

## 2017-04-19 DIAGNOSIS — I2510 Atherosclerotic heart disease of native coronary artery without angina pectoris: Secondary | ICD-10-CM | POA: Diagnosis not present

## 2017-04-19 DIAGNOSIS — R21 Rash and other nonspecific skin eruption: Secondary | ICD-10-CM | POA: Diagnosis not present

## 2017-04-19 DIAGNOSIS — E784 Other hyperlipidemia: Secondary | ICD-10-CM | POA: Diagnosis not present

## 2017-04-19 DIAGNOSIS — C8400 Mycosis fungoides, unspecified site: Secondary | ICD-10-CM | POA: Diagnosis not present

## 2017-05-03 DIAGNOSIS — M9905 Segmental and somatic dysfunction of pelvic region: Secondary | ICD-10-CM | POA: Diagnosis not present

## 2017-05-03 DIAGNOSIS — M955 Acquired deformity of pelvis: Secondary | ICD-10-CM | POA: Diagnosis not present

## 2017-05-03 DIAGNOSIS — M9903 Segmental and somatic dysfunction of lumbar region: Secondary | ICD-10-CM | POA: Diagnosis not present

## 2017-05-03 DIAGNOSIS — M5136 Other intervertebral disc degeneration, lumbar region: Secondary | ICD-10-CM | POA: Diagnosis not present

## 2017-06-02 DIAGNOSIS — M5136 Other intervertebral disc degeneration, lumbar region: Secondary | ICD-10-CM | POA: Diagnosis not present

## 2017-06-02 DIAGNOSIS — M9903 Segmental and somatic dysfunction of lumbar region: Secondary | ICD-10-CM | POA: Diagnosis not present

## 2017-06-02 DIAGNOSIS — M955 Acquired deformity of pelvis: Secondary | ICD-10-CM | POA: Diagnosis not present

## 2017-06-02 DIAGNOSIS — M9905 Segmental and somatic dysfunction of pelvic region: Secondary | ICD-10-CM | POA: Diagnosis not present

## 2017-06-16 DIAGNOSIS — M5136 Other intervertebral disc degeneration, lumbar region: Secondary | ICD-10-CM | POA: Diagnosis not present

## 2017-06-16 DIAGNOSIS — M955 Acquired deformity of pelvis: Secondary | ICD-10-CM | POA: Diagnosis not present

## 2017-06-16 DIAGNOSIS — M9903 Segmental and somatic dysfunction of lumbar region: Secondary | ICD-10-CM | POA: Diagnosis not present

## 2017-06-16 DIAGNOSIS — M9905 Segmental and somatic dysfunction of pelvic region: Secondary | ICD-10-CM | POA: Diagnosis not present

## 2017-06-22 DIAGNOSIS — I2510 Atherosclerotic heart disease of native coronary artery without angina pectoris: Secondary | ICD-10-CM | POA: Diagnosis not present

## 2017-06-22 DIAGNOSIS — M1990 Unspecified osteoarthritis, unspecified site: Secondary | ICD-10-CM | POA: Diagnosis not present

## 2017-06-22 DIAGNOSIS — J219 Acute bronchiolitis, unspecified: Secondary | ICD-10-CM | POA: Diagnosis not present

## 2017-06-22 DIAGNOSIS — J209 Acute bronchitis, unspecified: Secondary | ICD-10-CM | POA: Diagnosis not present

## 2017-06-24 DIAGNOSIS — J209 Acute bronchitis, unspecified: Secondary | ICD-10-CM | POA: Diagnosis not present

## 2017-06-28 DIAGNOSIS — M955 Acquired deformity of pelvis: Secondary | ICD-10-CM | POA: Diagnosis not present

## 2017-06-28 DIAGNOSIS — M5136 Other intervertebral disc degeneration, lumbar region: Secondary | ICD-10-CM | POA: Diagnosis not present

## 2017-06-28 DIAGNOSIS — M9905 Segmental and somatic dysfunction of pelvic region: Secondary | ICD-10-CM | POA: Diagnosis not present

## 2017-06-28 DIAGNOSIS — M9903 Segmental and somatic dysfunction of lumbar region: Secondary | ICD-10-CM | POA: Diagnosis not present

## 2017-07-26 DIAGNOSIS — M9905 Segmental and somatic dysfunction of pelvic region: Secondary | ICD-10-CM | POA: Diagnosis not present

## 2017-07-26 DIAGNOSIS — Z981 Arthrodesis status: Secondary | ICD-10-CM | POA: Diagnosis not present

## 2017-07-26 DIAGNOSIS — I2510 Atherosclerotic heart disease of native coronary artery without angina pectoris: Secondary | ICD-10-CM | POA: Diagnosis not present

## 2017-07-26 DIAGNOSIS — M5136 Other intervertebral disc degeneration, lumbar region: Secondary | ICD-10-CM | POA: Diagnosis not present

## 2017-07-26 DIAGNOSIS — M955 Acquired deformity of pelvis: Secondary | ICD-10-CM | POA: Diagnosis not present

## 2017-07-26 DIAGNOSIS — M9903 Segmental and somatic dysfunction of lumbar region: Secondary | ICD-10-CM | POA: Diagnosis not present

## 2017-07-26 DIAGNOSIS — E7849 Other hyperlipidemia: Secondary | ICD-10-CM | POA: Diagnosis not present

## 2017-07-26 DIAGNOSIS — Z955 Presence of coronary angioplasty implant and graft: Secondary | ICD-10-CM | POA: Diagnosis not present

## 2017-07-26 DIAGNOSIS — M1990 Unspecified osteoarthritis, unspecified site: Secondary | ICD-10-CM | POA: Diagnosis not present

## 2017-07-26 DIAGNOSIS — M4322 Fusion of spine, cervical region: Secondary | ICD-10-CM | POA: Diagnosis not present

## 2017-07-26 DIAGNOSIS — C8400 Mycosis fungoides, unspecified site: Secondary | ICD-10-CM | POA: Diagnosis not present

## 2017-08-23 DIAGNOSIS — M5136 Other intervertebral disc degeneration, lumbar region: Secondary | ICD-10-CM | POA: Diagnosis not present

## 2017-08-23 DIAGNOSIS — M9903 Segmental and somatic dysfunction of lumbar region: Secondary | ICD-10-CM | POA: Diagnosis not present

## 2017-08-23 DIAGNOSIS — M9905 Segmental and somatic dysfunction of pelvic region: Secondary | ICD-10-CM | POA: Diagnosis not present

## 2017-08-23 DIAGNOSIS — M955 Acquired deformity of pelvis: Secondary | ICD-10-CM | POA: Diagnosis not present

## 2017-09-24 DIAGNOSIS — C8400 Mycosis fungoides, unspecified site: Secondary | ICD-10-CM | POA: Diagnosis not present

## 2017-09-24 DIAGNOSIS — M1990 Unspecified osteoarthritis, unspecified site: Secondary | ICD-10-CM | POA: Diagnosis not present

## 2017-09-24 DIAGNOSIS — G894 Chronic pain syndrome: Secondary | ICD-10-CM | POA: Diagnosis not present

## 2017-09-27 DIAGNOSIS — M5136 Other intervertebral disc degeneration, lumbar region: Secondary | ICD-10-CM | POA: Diagnosis not present

## 2017-09-27 DIAGNOSIS — M9905 Segmental and somatic dysfunction of pelvic region: Secondary | ICD-10-CM | POA: Diagnosis not present

## 2017-09-27 DIAGNOSIS — M955 Acquired deformity of pelvis: Secondary | ICD-10-CM | POA: Diagnosis not present

## 2017-09-27 DIAGNOSIS — M9903 Segmental and somatic dysfunction of lumbar region: Secondary | ICD-10-CM | POA: Diagnosis not present

## 2017-12-13 ENCOUNTER — Ambulatory Visit: Admit: 2017-12-13 | Discharge: 2017-12-14 | Payer: MEDICARE | Attending: Dermatology | Primary: Dermatology

## 2017-12-13 DIAGNOSIS — C8400 Mycosis fungoides, unspecified site: Principal | ICD-10-CM

## 2017-12-14 MED ORDER — TRIAMCINOLONE ACETONIDE 0.1 % TOPICAL OINTMENT
INTRAMUSCULAR | 6 refills | 0.00000 days | Status: CP
Start: 2017-12-14 — End: 2018-07-25

## 2017-12-14 MED ORDER — CLOBETASOL 0.05 % TOPICAL OINTMENT
Freq: Two times a day (BID) | TOPICAL | 3 refills | 0.00000 days | Status: CP
Start: 2017-12-14 — End: 2017-12-27

## 2017-12-27 ENCOUNTER — Ambulatory Visit: Admit: 2017-12-27 | Discharge: 2017-12-28 | Payer: MEDICARE | Attending: Dermatology | Primary: Dermatology

## 2017-12-27 DIAGNOSIS — C8400 Mycosis fungoides, unspecified site: Principal | ICD-10-CM

## 2017-12-27 DIAGNOSIS — L820 Inflamed seborrheic keratosis: Secondary | ICD-10-CM

## 2017-12-27 DIAGNOSIS — D485 Neoplasm of uncertain behavior of skin: Secondary | ICD-10-CM

## 2017-12-27 MED ORDER — MECHLORETHAMINE 0.016 % TOPICAL GEL
5 refills | 0.00000 days | Status: CP
Start: 2017-12-27 — End: ?

## 2017-12-27 MED ORDER — BETAMETHASONE, AUGMENTED 0.05 % TOPICAL OINTMENT
5 refills | 0.00000 days | Status: CP
Start: 2017-12-27 — End: 2018-07-25

## 2018-01-10 ENCOUNTER — Ambulatory Visit: Admit: 2018-01-10 | Discharge: 2018-01-11 | Payer: MEDICARE

## 2018-01-10 DIAGNOSIS — L820 Inflamed seborrheic keratosis: Secondary | ICD-10-CM

## 2018-01-10 DIAGNOSIS — C44519 Basal cell carcinoma of skin of other part of trunk: Principal | ICD-10-CM

## 2018-04-06 DIAGNOSIS — I10 Essential (primary) hypertension: Secondary | ICD-10-CM | POA: Diagnosis present

## 2018-07-25 ENCOUNTER — Ambulatory Visit: Admit: 2018-07-25 | Discharge: 2018-07-26 | Payer: MEDICARE | Attending: Dermatology | Primary: Dermatology

## 2018-07-25 DIAGNOSIS — Z79899 Other long term (current) drug therapy: Secondary | ICD-10-CM

## 2018-07-25 DIAGNOSIS — Z7289 Other problems related to lifestyle: Secondary | ICD-10-CM

## 2018-07-25 DIAGNOSIS — C8400 Mycosis fungoides, unspecified site: Principal | ICD-10-CM

## 2018-07-25 DIAGNOSIS — Z85828 Personal history of other malignant neoplasm of skin: Secondary | ICD-10-CM

## 2018-07-25 DIAGNOSIS — L821 Other seborrheic keratosis: Secondary | ICD-10-CM

## 2018-07-25 DIAGNOSIS — L239 Allergic contact dermatitis, unspecified cause: Secondary | ICD-10-CM

## 2018-07-25 DIAGNOSIS — Z1159 Encounter for screening for other viral diseases: Secondary | ICD-10-CM

## 2018-07-25 MED ORDER — METHOTREXATE SODIUM 2.5 MG TABLET
ORAL_TABLET | ORAL | 1 refills | 0.00000 days | Status: CP
Start: 2018-07-25 — End: 2018-08-22

## 2018-07-25 MED ORDER — CLOBETASOL 0.05 % TOPICAL OINTMENT
OPHTHALMIC | 5 refills | 0.00000 days | Status: CP
Start: 2018-07-25 — End: ?

## 2018-07-25 MED ORDER — FOLIC ACID 1 MG TABLET
ORAL_TABLET | 3 refills | 0.00000 days | Status: CP
Start: 2018-07-25 — End: 2018-08-22

## 2018-08-22 ENCOUNTER — Ambulatory Visit: Admit: 2018-08-22 | Discharge: 2018-08-23 | Payer: MEDICARE | Attending: Dermatology | Primary: Dermatology

## 2018-08-22 DIAGNOSIS — C8400 Mycosis fungoides, unspecified site: Secondary | ICD-10-CM

## 2018-08-22 DIAGNOSIS — Z79899 Other long term (current) drug therapy: Principal | ICD-10-CM

## 2018-08-22 MED ORDER — METHOTREXATE SODIUM 2.5 MG TABLET
ORAL_TABLET | ORAL | 2 refills | 0.00000 days | Status: CP
Start: 2018-08-22 — End: 2018-09-21

## 2018-08-22 MED ORDER — FOLIC ACID 1 MG TABLET
ORAL_TABLET | 3 refills | 0.00000 days | Status: CP
Start: 2018-08-22 — End: 2018-11-21

## 2018-11-21 ENCOUNTER — Ambulatory Visit: Admit: 2018-11-21 | Discharge: 2018-11-22 | Payer: MEDICARE | Attending: Dermatology | Primary: Dermatology

## 2018-11-21 DIAGNOSIS — C8400 Mycosis fungoides, unspecified site: Secondary | ICD-10-CM

## 2018-11-21 DIAGNOSIS — Z79899 Other long term (current) drug therapy: Principal | ICD-10-CM

## 2018-11-21 MED ORDER — FOLIC ACID 1 MG TABLET
ORAL_TABLET | 3 refills | 0.00000 days | Status: CP
Start: 2018-11-21 — End: 2019-03-27

## 2018-11-21 MED ORDER — METHOTREXATE SODIUM 2.5 MG TABLET
ORAL_TABLET | 0 refills | 0.00000 days | Status: CP
Start: 2018-11-21 — End: 2019-03-07

## 2019-01-16 ENCOUNTER — Encounter: Payer: Self-pay | Admitting: Podiatry

## 2019-01-16 ENCOUNTER — Ambulatory Visit: Payer: Medicare HMO | Admitting: Podiatry

## 2019-01-16 ENCOUNTER — Other Ambulatory Visit: Payer: Self-pay

## 2019-01-16 VITALS — Temp 98.1°F

## 2019-01-16 DIAGNOSIS — L03031 Cellulitis of right toe: Secondary | ICD-10-CM | POA: Diagnosis not present

## 2019-01-16 MED ORDER — CEPHALEXIN 500 MG PO CAPS: 500.0000 mg | ORAL_CAPSULE | Freq: Two times a day (BID) | ORAL | 0 refills | Status: DC

## 2019-01-16 NOTE — Progress Notes (Signed)
This patient presents the office with chief complaint of a painful right big toe.  Patient states that he has an ingrowing toenail on the outside border of the right great toe for 4 to 5 weeks.  He says he has been living with it and treating it with peroxide Neosporin and Epson salt soaks.  Patient states that is continuing to get worse and it is now painful walking wearing his shoes.  He presents the office today for definitive evaluation and treatment of this painful ingrowing toenail right big toe.  Vascular  Dorsalis pedis and posterior tibial pulses are palpable  B/L.  Capillary return  WNL.  Temperature gradient is  WNL.  Skin turgor  WNL.  Hair present on digits.  Sensorium  Senn Weinstein monofilament wire  WNL. Normal tactile sensation.  Nail Exam  Patient has normal nails with no evidence of bacterial or fungal infection. Marked incurvation lateral border right hallux.  Granulation tissue along the lateral border and cellulitis at proximal nail fold.  Orthopedic  Exam  Muscle tone and muscle strength  WNL.  No limitations of motion feet  B/L.  No crepitus or joint effusion noted.  Foot type is unremarkable and digits show no abnormalities.  Bony prominences are unremarkable.  Skin  No open lesions.  Normal skin texture and turgor.  Paronychia right hallux.   IE.  Nail surgery.  Treatment options and alternatives discussed.  Recommended permanent phenol matrixectomy and patient agreed.  Right hallux  was prepped with alcohol and a toe block of 3cc of 2% lidocaine plain was administered in a digital toe block. .  The toe was then prepped with betadine solution . A tourniquet was applied to toe. The offending nail border was then excised and matrix tissue exposed.  Phenol was then applied to the matrix tissue followed by an alcohol wash.  Antibiotic ointment and a dry sterile dressing was applied.  The patient was dispensed instructions for aftercare.  Cephalexin prescribed.  RTC 10  days.     Gardiner Barefoot DPM

## 2019-01-17 ENCOUNTER — Ambulatory Visit: Payer: Self-pay | Admitting: Podiatry

## 2019-01-26 ENCOUNTER — Ambulatory Visit: Payer: Medicare HMO | Admitting: Podiatry

## 2019-03-07 MED ORDER — METHOTREXATE SODIUM 2.5 MG TABLET
ORAL_TABLET | 0 refills | 0.00000 days | Status: CP
Start: 2019-03-07 — End: 2019-03-27

## 2019-03-27 ENCOUNTER — Ambulatory Visit: Admit: 2019-03-27 | Discharge: 2019-03-28 | Payer: MEDICARE | Attending: Dermatology | Primary: Dermatology

## 2019-03-27 DIAGNOSIS — C8400 Mycosis fungoides, unspecified site: Secondary | ICD-10-CM

## 2019-03-27 DIAGNOSIS — Z79899 Other long term (current) drug therapy: Principal | ICD-10-CM

## 2019-03-27 MED ORDER — FOLIC ACID 1 MG TABLET
ORAL_TABLET | 3 refills | 0.00000 days | Status: CP
Start: 2019-03-27 — End: ?

## 2019-03-27 MED ORDER — METHOTREXATE SODIUM 2.5 MG TABLET
ORAL_TABLET | 1 refills | 0.00000 days | Status: CP
Start: 2019-03-27 — End: ?

## 2019-03-27 MED ORDER — BETAMETHASONE, AUGMENTED 0.05 % TOPICAL CREAM
Freq: Two times a day (BID) | TOPICAL | 2 refills | 0.00000 days | Status: CP
Start: 2019-03-27 — End: 2020-03-26

## 2019-04-26 ENCOUNTER — Other Ambulatory Visit: Payer: Self-pay | Admitting: Physical Medicine and Rehabilitation

## 2019-04-26 DIAGNOSIS — M5136 Other intervertebral disc degeneration, lumbar region: Secondary | ICD-10-CM

## 2019-05-10 ENCOUNTER — Other Ambulatory Visit: Payer: Self-pay

## 2019-05-10 ENCOUNTER — Ambulatory Visit
Admission: RE | Admit: 2019-05-10 | Discharge: 2019-05-10 | Disposition: A | Discharge status: Home / Self Care | Payer: Medicare HMO | Source: Ambulatory Visit | Attending: Physical Medicine and Rehabilitation | Admitting: Physical Medicine and Rehabilitation

## 2019-05-10 DIAGNOSIS — M5136 Other intervertebral disc degeneration, lumbar region: Secondary | ICD-10-CM | POA: Diagnosis present

## 2019-06-19 ENCOUNTER — Ambulatory Visit: Admit: 2019-06-19 | Discharge: 2019-06-20 | Payer: MEDICARE | Attending: Dermatology | Primary: Dermatology

## 2019-06-19 DIAGNOSIS — C8400 Mycosis fungoides, unspecified site: Secondary | ICD-10-CM

## 2019-06-19 DIAGNOSIS — Z79899 Other long term (current) drug therapy: Secondary | ICD-10-CM

## 2019-06-19 MED ORDER — TRIAMCINOLONE ACETONIDE 0.1 % TOPICAL OINTMENT
Freq: Two times a day (BID) | TOPICAL | 2 refills | 0.00000 days | Status: CP
Start: 2019-06-19 — End: ?

## 2019-06-22 ENCOUNTER — Encounter: Payer: Self-pay | Admitting: Podiatry

## 2019-06-22 ENCOUNTER — Other Ambulatory Visit: Payer: Self-pay

## 2019-06-22 ENCOUNTER — Ambulatory Visit (INDEPENDENT_AMBULATORY_CARE_PROVIDER_SITE_OTHER): Payer: Medicare HMO | Admitting: Podiatry

## 2019-06-22 DIAGNOSIS — L03032 Cellulitis of left toe: Secondary | ICD-10-CM | POA: Diagnosis not present

## 2019-06-22 NOTE — Progress Notes (Signed)
This patient presents the office with chief complaint of a painful left big toe patient states that he has been dealing with this ingrowing nail on his left big toe for years.  He states that the nail causes pain and discomfort walking and wearing his shoes.  He had nail surgery for the similar  nail problem on his right big toe which has healed.  He presents the office today for an evaluation and treatment and desires nail surgery for the permanent correction of this growing toenail left big toe.  Vascular  Dorsalis pedis and posterior tibial pulses are palpable  B/L.  Capillary return  WNL.  Temperature gradient is  WNL.  Skin turgor  WNL  Sensorium  Senn Weinstein monofilament wire  WNL. Normal tactile sensation.  Nail Exam  Patient has normal nails with no evidence of bacterial or fungal infection. Redness and pain noted medial border left great toe.  No granulation or pus noted.  Orthopedic  Exam  Muscle tone and muscle strength  WNL.  No limitations of motion feet  B/L.  No crepitus or joint effusion noted.  Foot type is unremarkable and digits show no abnormalities.  Bony prominences are unremarkable.  Skin  No open lesions.  Normal skin texture and turgor.  Paronychia left great toe.  ROV.  Nail surgery left hallux.  Treatment options and alternatives discussed.  Recommended permanent phenol matrixectomy and patient agreed.  Left hallux  was prepped with alcohol and a toe block of 3cc of 2% lidocaine plain was administered in a digital toe block. .  The toe was then prepped with betadine solution . A tourniquet was applied to toe. The offending nail border was then excised and matrix tissue exposed.  Phenol was then applied to the matrix tissue followed by an alcohol wash.  Antibiotic ointment and a dry sterile dressing was applied.  The patient was dispensed instructions for aftercare. RTC 10 days.     Gardiner Barefoot DPM

## 2019-07-03 ENCOUNTER — Ambulatory Visit: Payer: Medicare HMO | Admitting: Podiatry

## 2019-07-03 ENCOUNTER — Encounter: Payer: Self-pay | Admitting: Podiatry

## 2019-07-03 ENCOUNTER — Other Ambulatory Visit: Payer: Self-pay

## 2019-07-03 DIAGNOSIS — Z09 Encounter for follow-up examination after completed treatment for conditions other than malignant neoplasm: Secondary | ICD-10-CM

## 2019-07-03 NOTE — Progress Notes (Signed)
This patient returns to the office following nail surgery ten days ago.   The patient says toe has been soaked and bandaged as directed.  There has been improvement of the toe since the surgery has been performed. The patient presents for continued evaluation and treatment.  GENERAL APPEARANCE: Alert, conversant. Appropriately groomed. No acute distress.  VASCULAR: Pedal pulses palpable at  [facility] and PT bilateral.  Capillary refill time is immediate to all digits,  Normal temperature gradient.    NEUROLOGIC: sensation is normal to 5.07 monofilament at 5/5 sites bilateral.  Light touch is intact bilateral, Muscle strength normal.  MUSCULOSKELETAL: acceptable muscle strength, tone and stability bilateral.  Intrinsic muscluature intact bilateral.  Rectus appearance of foot and digits noted bilateral.   DERMATOLOGIC: skin color, texture, and turgor are within normal limits.  No preulcerative lesions or ulcers  are seen, no interdigital maceration noted.   NAILS  There is necrotic tissue along the nail groove  There is drainage at the proximal nail fold.  No infection.  DX  S/p nail surgery  ROV  Home instructions were discussed.  Patient to call the office if there are any questions or concerns. Toenail is healing with no infection.   Gardiner Barefoot DPM

## 2020-04-11 DIAGNOSIS — G4733 Obstructive sleep apnea (adult) (pediatric): Secondary | ICD-10-CM | POA: Diagnosis present

## 2020-10-08 DIAGNOSIS — I10 Essential (primary) hypertension: Secondary | ICD-10-CM | POA: Diagnosis not present

## 2020-10-08 DIAGNOSIS — Z125 Encounter for screening for malignant neoplasm of prostate: Secondary | ICD-10-CM | POA: Diagnosis not present

## 2020-10-08 DIAGNOSIS — M79641 Pain in right hand: Secondary | ICD-10-CM | POA: Diagnosis not present

## 2020-10-08 DIAGNOSIS — Z0000 Encounter for general adult medical examination without abnormal findings: Secondary | ICD-10-CM | POA: Diagnosis not present

## 2020-10-08 DIAGNOSIS — E7800 Pure hypercholesterolemia, unspecified: Secondary | ICD-10-CM | POA: Diagnosis not present

## 2020-10-08 DIAGNOSIS — G4733 Obstructive sleep apnea (adult) (pediatric): Secondary | ICD-10-CM | POA: Diagnosis not present

## 2020-10-08 DIAGNOSIS — Z955 Presence of coronary angioplasty implant and graft: Secondary | ICD-10-CM | POA: Diagnosis not present

## 2020-10-08 DIAGNOSIS — M79642 Pain in left hand: Secondary | ICD-10-CM | POA: Diagnosis not present

## 2020-10-08 DIAGNOSIS — M1990 Unspecified osteoarthritis, unspecified site: Secondary | ICD-10-CM | POA: Diagnosis not present

## 2020-10-08 DIAGNOSIS — I2510 Atherosclerotic heart disease of native coronary artery without angina pectoris: Secondary | ICD-10-CM | POA: Diagnosis not present

## 2020-10-15 DIAGNOSIS — I10 Essential (primary) hypertension: Secondary | ICD-10-CM | POA: Diagnosis not present

## 2020-10-15 DIAGNOSIS — C8400 Mycosis fungoides, unspecified site: Secondary | ICD-10-CM | POA: Diagnosis not present

## 2020-10-15 DIAGNOSIS — G4733 Obstructive sleep apnea (adult) (pediatric): Secondary | ICD-10-CM | POA: Diagnosis not present

## 2020-10-15 DIAGNOSIS — Z981 Arthrodesis status: Secondary | ICD-10-CM | POA: Diagnosis not present

## 2020-10-15 DIAGNOSIS — E7849 Other hyperlipidemia: Secondary | ICD-10-CM | POA: Diagnosis not present

## 2020-10-15 DIAGNOSIS — Z955 Presence of coronary angioplasty implant and graft: Secondary | ICD-10-CM | POA: Diagnosis not present

## 2020-10-15 DIAGNOSIS — I2510 Atherosclerotic heart disease of native coronary artery without angina pectoris: Secondary | ICD-10-CM | POA: Diagnosis not present

## 2020-11-01 DIAGNOSIS — G4733 Obstructive sleep apnea (adult) (pediatric): Secondary | ICD-10-CM | POA: Diagnosis not present

## 2020-11-05 DIAGNOSIS — M9903 Segmental and somatic dysfunction of lumbar region: Secondary | ICD-10-CM | POA: Diagnosis not present

## 2020-11-05 DIAGNOSIS — M9905 Segmental and somatic dysfunction of pelvic region: Secondary | ICD-10-CM | POA: Diagnosis not present

## 2020-11-05 DIAGNOSIS — M955 Acquired deformity of pelvis: Secondary | ICD-10-CM | POA: Diagnosis not present

## 2020-11-05 DIAGNOSIS — M5136 Other intervertebral disc degeneration, lumbar region: Secondary | ICD-10-CM | POA: Diagnosis not present

## 2020-11-21 DIAGNOSIS — G4733 Obstructive sleep apnea (adult) (pediatric): Secondary | ICD-10-CM | POA: Diagnosis not present

## 2020-12-02 DIAGNOSIS — M5136 Other intervertebral disc degeneration, lumbar region: Secondary | ICD-10-CM | POA: Diagnosis not present

## 2020-12-02 DIAGNOSIS — M955 Acquired deformity of pelvis: Secondary | ICD-10-CM | POA: Diagnosis not present

## 2020-12-02 DIAGNOSIS — M9905 Segmental and somatic dysfunction of pelvic region: Secondary | ICD-10-CM | POA: Diagnosis not present

## 2020-12-02 DIAGNOSIS — M9903 Segmental and somatic dysfunction of lumbar region: Secondary | ICD-10-CM | POA: Diagnosis not present

## 2020-12-19 DIAGNOSIS — G4733 Obstructive sleep apnea (adult) (pediatric): Secondary | ICD-10-CM | POA: Diagnosis not present

## 2021-01-08 DIAGNOSIS — Z6832 Body mass index (BMI) 32.0-32.9, adult: Secondary | ICD-10-CM | POA: Diagnosis not present

## 2021-01-08 DIAGNOSIS — G5603 Carpal tunnel syndrome, bilateral upper limbs: Secondary | ICD-10-CM | POA: Diagnosis not present

## 2021-01-08 DIAGNOSIS — M5412 Radiculopathy, cervical region: Secondary | ICD-10-CM | POA: Diagnosis not present

## 2021-01-08 DIAGNOSIS — I10 Essential (primary) hypertension: Secondary | ICD-10-CM | POA: Diagnosis not present

## 2021-01-09 ENCOUNTER — Other Ambulatory Visit: Payer: Self-pay | Admitting: Neurological Surgery

## 2021-01-09 DIAGNOSIS — M5412 Radiculopathy, cervical region: Secondary | ICD-10-CM

## 2021-01-13 DIAGNOSIS — Z955 Presence of coronary angioplasty implant and graft: Secondary | ICD-10-CM | POA: Diagnosis not present

## 2021-01-13 DIAGNOSIS — Z95820 Peripheral vascular angioplasty status with implants and grafts: Secondary | ICD-10-CM | POA: Diagnosis not present

## 2021-01-13 DIAGNOSIS — R001 Bradycardia, unspecified: Secondary | ICD-10-CM | POA: Diagnosis not present

## 2021-01-13 DIAGNOSIS — R0600 Dyspnea, unspecified: Secondary | ICD-10-CM | POA: Diagnosis not present

## 2021-01-13 DIAGNOSIS — I2510 Atherosclerotic heart disease of native coronary artery without angina pectoris: Secondary | ICD-10-CM | POA: Diagnosis not present

## 2021-01-13 DIAGNOSIS — R5383 Other fatigue: Secondary | ICD-10-CM | POA: Diagnosis not present

## 2021-01-13 DIAGNOSIS — K219 Gastro-esophageal reflux disease without esophagitis: Secondary | ICD-10-CM | POA: Diagnosis not present

## 2021-01-13 DIAGNOSIS — I209 Angina pectoris, unspecified: Secondary | ICD-10-CM | POA: Diagnosis not present

## 2021-01-13 DIAGNOSIS — I10 Essential (primary) hypertension: Secondary | ICD-10-CM | POA: Diagnosis not present

## 2021-01-13 DIAGNOSIS — E669 Obesity, unspecified: Secondary | ICD-10-CM | POA: Diagnosis not present

## 2021-01-13 DIAGNOSIS — E782 Mixed hyperlipidemia: Secondary | ICD-10-CM | POA: Diagnosis not present

## 2021-01-13 DIAGNOSIS — C8400 Mycosis fungoides, unspecified site: Secondary | ICD-10-CM | POA: Diagnosis not present

## 2021-01-19 DIAGNOSIS — G4733 Obstructive sleep apnea (adult) (pediatric): Secondary | ICD-10-CM | POA: Diagnosis not present

## 2021-01-26 ENCOUNTER — Other Ambulatory Visit: Payer: Medicare HMO

## 2021-01-31 ENCOUNTER — Ambulatory Visit
Admission: RE | Admit: 2021-01-31 | Discharge: 2021-01-31 | Disposition: A | Discharge status: Home / Self Care | Payer: PPO | Source: Ambulatory Visit | Attending: Neurological Surgery | Admitting: Neurological Surgery

## 2021-01-31 ENCOUNTER — Other Ambulatory Visit: Payer: Self-pay

## 2021-01-31 DIAGNOSIS — M5412 Radiculopathy, cervical region: Secondary | ICD-10-CM

## 2021-01-31 DIAGNOSIS — M4802 Spinal stenosis, cervical region: Secondary | ICD-10-CM | POA: Diagnosis not present

## 2021-02-09 ENCOUNTER — Other Ambulatory Visit: Payer: PPO

## 2021-02-18 DIAGNOSIS — G4733 Obstructive sleep apnea (adult) (pediatric): Secondary | ICD-10-CM | POA: Diagnosis not present

## 2021-02-19 DIAGNOSIS — M79601 Pain in right arm: Secondary | ICD-10-CM | POA: Diagnosis not present

## 2021-02-19 DIAGNOSIS — M25611 Stiffness of right shoulder, not elsewhere classified: Secondary | ICD-10-CM | POA: Diagnosis not present

## 2021-02-19 DIAGNOSIS — R200 Anesthesia of skin: Secondary | ICD-10-CM | POA: Diagnosis not present

## 2021-02-19 DIAGNOSIS — Z955 Presence of coronary angioplasty implant and graft: Secondary | ICD-10-CM | POA: Diagnosis not present

## 2021-02-19 DIAGNOSIS — M25619 Stiffness of unspecified shoulder, not elsewhere classified: Secondary | ICD-10-CM | POA: Diagnosis not present

## 2021-02-19 DIAGNOSIS — M19041 Primary osteoarthritis, right hand: Secondary | ICD-10-CM | POA: Diagnosis not present

## 2021-02-19 DIAGNOSIS — Z6831 Body mass index (BMI) 31.0-31.9, adult: Secondary | ICD-10-CM | POA: Diagnosis not present

## 2021-02-19 DIAGNOSIS — G5603 Carpal tunnel syndrome, bilateral upper limbs: Secondary | ICD-10-CM | POA: Diagnosis not present

## 2021-02-19 DIAGNOSIS — E7849 Other hyperlipidemia: Secondary | ICD-10-CM | POA: Diagnosis not present

## 2021-02-19 DIAGNOSIS — M19042 Primary osteoarthritis, left hand: Secondary | ICD-10-CM | POA: Diagnosis not present

## 2021-02-19 DIAGNOSIS — I10 Essential (primary) hypertension: Secondary | ICD-10-CM | POA: Diagnosis not present

## 2021-02-19 DIAGNOSIS — M79602 Pain in left arm: Secondary | ICD-10-CM | POA: Diagnosis not present

## 2021-02-19 DIAGNOSIS — G4733 Obstructive sleep apnea (adult) (pediatric): Secondary | ICD-10-CM | POA: Diagnosis not present

## 2021-02-19 DIAGNOSIS — C8400 Mycosis fungoides, unspecified site: Secondary | ICD-10-CM | POA: Diagnosis not present

## 2021-02-28 DIAGNOSIS — M79641 Pain in right hand: Secondary | ICD-10-CM | POA: Diagnosis not present

## 2021-02-28 DIAGNOSIS — M79642 Pain in left hand: Secondary | ICD-10-CM | POA: Diagnosis not present

## 2021-03-11 DIAGNOSIS — M25541 Pain in joints of right hand: Secondary | ICD-10-CM | POA: Diagnosis not present

## 2021-03-11 DIAGNOSIS — G5603 Carpal tunnel syndrome, bilateral upper limbs: Secondary | ICD-10-CM | POA: Diagnosis not present

## 2021-03-11 DIAGNOSIS — M25542 Pain in joints of left hand: Secondary | ICD-10-CM | POA: Diagnosis not present

## 2021-03-12 DIAGNOSIS — M48061 Spinal stenosis, lumbar region without neurogenic claudication: Secondary | ICD-10-CM | POA: Diagnosis not present

## 2021-03-18 ENCOUNTER — Other Ambulatory Visit: Payer: Self-pay | Admitting: Neurological Surgery

## 2021-03-21 DIAGNOSIS — G4733 Obstructive sleep apnea (adult) (pediatric): Secondary | ICD-10-CM | POA: Diagnosis not present

## 2021-04-03 NOTE — Progress Notes (Signed)
Surgical Instructions    Your procedure is scheduled on Tuesday, April 08, 2021.  Report to [facility] Main Entrance "A" at 08:55 A.M., then check in with the Admitting office.  Call this number if you have problems the morning of surgery:  [phone]   If you have any questions prior to your surgery date call [phone]: Open Monday-Friday 8am-4pm    Remember:  Do not eat after midnight the night before your surgery  You may drink clear liquids until 07:55 the morning of your surgery.   Clear liquids allowed are: Water, Non-Citrus Juices (without pulp), Carbonated Beverages, Clear Tea, Black Coffee Only, and Gatorade    Take these medicines the morning of surgery with A SIP OF WATER:  atenolol (TENORMIN)  atorvastatin (LIPITOR)  gabapentin (NEURONTIN)  misoprostol (CYTOTEC)   If needed:  acetaminophen (TYLENOL)  pantoprazole (PROTONIX)  Follow your surgeon's instructions on when to stop Aspirin.  If no instructions were given by your surgeon then you will need to call the office to get those instructions.     As of today, STOP taking any Aspirin (unless otherwise instructed by your surgeon) Aleve, Naproxen, Ibuprofen, Motrin, Advil, Goody's, BC's, all herbal medications, fish oil, and all vitamins.          Do not wear jewelry  Do not wear lotions, powders, colognes, or deodorant.             Men may shave face and neck. Do not bring valuables to the hospital. DO Not wear nail polish, gel polish, artificial nails, or any other type of covering on natural nails including finger and toenails. If patients have artificial nails, gel coating, etc. that need to be removed by a nail salon please have this removed prior to surgery or surgery may need to be canceled/delayed if the surgeon/ anesthesia feels like the patient is unable to be adequately monitored.             [facility] is not responsible for any belongings or valuables.  Do NOT Smoke (Tobacco/Vaping) or drink  Alcohol 24 hours prior to your procedure If you use a CPAP at night, you may bring all equipment for your overnight stay.   Contacts, glasses, dentures or bridgework may not be worn into surgery, please bring cases for these belongings   For patients admitted to the hospital, discharge time will be determined by your treatment team.   Patients discharged the day of surgery will not be allowed to drive home, and someone needs to stay with them for 24 hours.  ONLY 1 SUPPORT PERSON MAY BE PRESENT WHILE YOU ARE IN SURGERY. IF YOU ARE TO BE ADMITTED ONCE YOU ARE IN YOUR ROOM YOU WILL BE ALLOWED TWO (2) VISITORS.  Minor children may have two parents present. Special consideration for safety and communication needs will be reviewed on a case by case basis.  Special instructions:    Oral Hygiene is also important to reduce your risk of infection.  Remember - BRUSH YOUR TEETH THE MORNING OF SURGERY WITH YOUR REGULAR TOOTHPASTE   [facility]- Preparing For Surgery  Before surgery, you can play an important role. Because skin is not sterile, your skin needs to be as free of germs as possible. You can reduce the number of germs on your skin by washing with CHG (chlorahexidine gluconate) Soap before surgery.  CHG is an antiseptic cleaner which kills germs and bonds with the skin to continue killing germs even after washing.  Please do not use if you have an allergy to CHG or antibacterial soaps. If your skin becomes reddened/irritated stop using the CHG.  Do not shave (including legs and underarms) for at least 48 hours prior to first CHG shower. It is OK to shave your face.  Please follow these instructions carefully.     Shower the NIGHT BEFORE SURGERY and the MORNING OF SURGERY with CHG Soap.   If you chose to wash your hair, wash your hair first as usual with your normal shampoo. After you shampoo, rinse your hair and body thoroughly to remove the shampoo.  Then [employer] and genitals (private  parts) with your normal soap and rinse thoroughly to remove soap.  After that Use CHG Soap as you would any other liquid soap. You can apply CHG directly to the skin and wash gently with a scrungie or a clean washcloth.   Apply the CHG Soap to your body ONLY FROM THE NECK DOWN.  Do not use on open wounds or open sores. Avoid contact with your eyes, ears, mouth and genitals (private parts). Wash Face and genitals (private parts)  with your normal soap.   Wash thoroughly, paying special attention to the area where your surgery will be performed.  Thoroughly rinse your body with warm water from the neck down.  DO NOT shower/wash with your normal soap after using and rinsing off the CHG Soap.  Pat yourself dry with a CLEAN TOWEL.  Wear CLEAN PAJAMAS to bed the night before surgery  Place CLEAN SHEETS on your bed the night before your surgery  DO NOT SLEEP WITH PETS.   Day of Surgery:  Take a shower with CHG soap. Wear Clean/Comfortable clothing the morning of surgery Do not apply any deodorants/lotions.   Remember to brush your teeth WITH YOUR REGULAR TOOTHPASTE.   Please read over the following fact sheets that you were given.

## 2021-04-04 ENCOUNTER — Encounter (HOSPITAL_COMMUNITY): Payer: Self-pay

## 2021-04-04 ENCOUNTER — Other Ambulatory Visit: Payer: Self-pay

## 2021-04-04 ENCOUNTER — Encounter (HOSPITAL_COMMUNITY)
Admission: RE | Admit: 2021-04-04 | Discharge: 2021-04-04 | Disposition: A | Discharge status: Home / Self Care | Payer: PPO | Source: Ambulatory Visit | Attending: Neurological Surgery | Admitting: Neurological Surgery

## 2021-04-04 DIAGNOSIS — Z20822 Contact with and (suspected) exposure to covid-19: Secondary | ICD-10-CM | POA: Insufficient documentation

## 2021-04-04 DIAGNOSIS — Z01818 Encounter for other preprocedural examination: Secondary | ICD-10-CM | POA: Insufficient documentation

## 2021-04-04 HISTORY — DX: Anxiety disorder, unspecified: F41.9

## 2021-04-04 HISTORY — DX: Mycosis fungoides, unspecified site: C84.00

## 2021-04-04 HISTORY — DX: Pneumonia, unspecified organism: J18.9

## 2021-04-04 HISTORY — DX: Malignant (primary) neoplasm, unspecified: C80.1

## 2021-04-04 HISTORY — DX: Sleep apnea, unspecified: G47.30

## 2021-04-04 LAB — CBC
HCT: 46 % (ref 39.0–52.0)
Hemoglobin: 15.7 g/dL (ref 13.0–17.0)
MCH: 33.2 pg (ref 26.0–34.0)
MCHC: 34.1 g/dL (ref 30.0–36.0)
MCV: 97.3 fL (ref 80.0–100.0)
Platelets: 180 10*3/uL (ref 150–400)
RBC: 4.73 MIL/uL (ref 4.22–5.81)
RDW: 13.1 % (ref 11.5–15.5)
WBC: 6.8 10*3/uL (ref 4.0–10.5)
nRBC: 0 % (ref 0.0–0.2)

## 2021-04-04 LAB — BASIC METABOLIC PANEL
Anion gap: 9 (ref 5–15)
BUN: 19 mg/dL (ref 8–23)
CO2: 24 mmol/L (ref 22–32)
Calcium: 9.1 mg/dL (ref 8.9–10.3)
Chloride: 108 mmol/L (ref 98–111)
Creatinine, Ser: 1.06 mg/dL (ref 0.61–1.24)
GFR, Estimated: >60 mL/min (ref >60)
Glucose, Bld: 133 mg/dL — ABNORMAL HIGH (ref 70–99)
Potassium: 3.9 mmol/L (ref 3.5–5.1)
Sodium: 141 mmol/L (ref 135–145)

## 2021-04-04 LAB — TYPE AND SCREEN
ABO/RH(D): O NEG
Antibody Screen: NEGATIVE

## 2021-04-04 LAB — SURGICAL PCR SCREEN
MRSA, PCR: NEGATIVE
Staphylococcus aureus: NEGATIVE

## 2021-04-04 LAB — SARS CORONAVIRUS 2 (TAT 6-24 HRS): SARS Coronavirus 2: NEGATIVE

## 2021-04-04 NOTE — Progress Notes (Signed)
BP elevated in PAT 174/65 and 172/70 after the appointment. Patient asymptomatic, verbalized that he took Atenolol 20 mg this AM. Ebony Hail PA-C notified and patient was instructed to check with his PCP for better long term control.

## 2021-04-04 NOTE — Progress Notes (Addendum)
PCP - Tracie Harrier, MD Cardiologist - Lujean Amel, MD  PPM/ICD - denies Device Orders - N/A Rep Notified - N/A  Chest x-ray - N/A EKG - 04/04/2021 Stress Test - 02/28/2020 ECHO - 02/28/2020 Cardiac Cath - 2005  Sleep Study - yes, positive CPAP - not wearing  Fasting Blood Sugar - N/A   Blood Thinner Instructions: N/A Aspirin Instructions: Patient was instructed: As of today, STOP taking any Aspirin (unless otherwise instructed by your surgeon) Aleve, Naproxen, Ibuprofen, Motrin, Advil, Goody's, BC's, all herbal medications, fish oil, and all vitamins  Last dose of Aspirin was on 03/23/2021  ERAS Protcol - yes PRE-SURGERY Ensure or G2- no  COVID TEST- 04/04/2021   Anesthesia review: yes; cardiac history. Patient very anxious and worried about his OSA. BP elevated in PAT 174/65 and 172/70 after the appointment.   Patient denies shortness of breath, fever, cough and chest pain at PAT appointment   All instructions explained to the patient, with a verbal understanding of the material. Patient agrees to go over the instructions while at home for a better understanding. Patient also instructed to self quarantine after being tested for COVID-19. The opportunity to ask questions was provided.

## 2021-04-07 ENCOUNTER — Encounter (HOSPITAL_COMMUNITY): Payer: Self-pay

## 2021-04-07 NOTE — Progress Notes (Signed)
Anesthesia Chart Review:  Case: [id number] Date/Time: 04/08/21 1040   Procedures:      Lumbar 1-2 Lumbar 2-3 Anterolateral lumbar interbody fusion with posterior percutaneous fixation Lumbar 1 to Lumbar 3, removal of old hardware at Lumbar 4-5     LUMBAR PERCUTANEOUS PEDICLE SCREW 2 LEVEL   Anesthesia type: General   Pre-op diagnosis: degenerative Lumbar spinal stenosis   Location: [facility] OR ROOM 20 / [place] OR   Surgeons: Kristeen Miss, MD       DISCUSSION: Patient is a 75 year old male scheduled for the above procedure.  History includes former smoker, CAD (RCA stents x2, mid LAD stent x1 09/11/97; per Dr. Clayborn Bigness 01/13/21 note, "Last catheterization reportedly 2005 normal"), bradycardia, OSA "cannot tolerate CPAP" as of 02/19/21 visit with Dr. Ginette Pitman), HTN, hypercholesterolemia, mycosis fungoides (cutaneous T-cell lymphoma) neck surgery (cervical fusion 2002), back surgery (L3-5 PLIF 10/28/09). + COVID-19 home test 01/24/21. BMI is consistent with obesity.  Last cardiologist visit with Dr. Clayborn Bigness was on 01/13/21. No chest pain, but generalized fatigue with significant back pain. Was already scheduled for MRI for further evaluation for which he was prescribed pre-MRI Ativan for claustrophobia and tramadol as needed for pain. Bradycardia documented as "chronic stable no significant evidence of symptoms". On Atenolol. Note suggests for now continue medical therapy, current medications, heart healthy diet, modest weight loss, and aerobic exercise for CAD, fatigue, and obesity and would reassess in six months for new or persistent symptoms. He had a non-ischemic stress test in 02/2020. I communicated with Janett Billow at Dr. Clarice Pole who had reached out to Dr. Etta Quill office about surgery plans.    PAT BP 174/65 and 172/70 at PAT. He had taken atenolol 20 mg that AM. BP 136/82 and HR 60 at 01/13/21 [facility] Card[facility] visit and 158/80 documented at 02/19/21 visit with Jefm Bryant IM. Advised on-going follow-up with PCP  for HTN management.   ASA on hold for surgery. 04/04/21 presurgical COVID-19 test negative. Anesthesia team to evaluate on the day of surgery, and definitive plan at that time.     VS: BP (!) 174/65   Pulse (!) 57   Temp 36.8 C (Oral)   Resp 18   Ht 5\' 6"  (1.676 m)   Wt 96.2 kg   SpO2 97%   BMI 34.22 kg/m    PROVIDERS: Tracie Harrier, MD is PCP [facility]ef[facility] Bryant, [facility]) - Lujean Amel, MD is cardiologist [facility], [facility]) - Jennings Books, MD is neurologist [facility], [facility]) - Georgia Dom, MD is dermatologist ([place]). Last visit seen 06/19/19 for follow-up Mycosis fungoides, plaque stage, [id number] (Stage IB). Diagnosed 2013, initially good response to nbUVB phototherapy, failed trial of methotrexate. Home nbUVB light box ordered at that time since time and cost for office travel was a barrier to therapy.    LABS: Labs reviewed: Acceptable for surgery. (all labs ordered are listed, but only abnormal results are displayed)  Labs Reviewed  BASIC METABOLIC PANEL - Abnormal; Notable for the following components:      Result Value   Glucose, Bld 133 (*)    All other components within normal limits  SURGICAL PCR SCREEN  SARS CORONAVIRUS 2 (TAT 6-24 HRS)  CBC  TYPE AND SCREEN     IMAGES: MRI C-spine 01/31/21:  IMPRESSION: Multilevel degenerative changes as detailed above with suboptimal stenosis evaluation due to motion artifact. Canal stenosis is greatest at C5-C6. Foraminal narrowing is greatest from C4-C5 to C6-C7.    EKG: SB at 52 bpm   CV:  Nuclear stress test 02/28/20 ([facility]): Impression: Generally normal myocardial perfusion scan no evidence of  stress-induced myocardial ischemia ejection fraction about 52% with  essentially normal wall motion conclusion benign scan  Echo 02/28/20 ([facility]): INTERPRETATION  NORMAL LEFT VENTRICULAR SYSTOLIC FUNCTION  NORMAL RIGHT VENTRICULAR SYSTOLIC FUNCTION  MILD VALVULAR REGURGITATION (TRIVIAL MR, MILD TR)  NO  VALVULAR STENOSIS  EF 60%   Carotid US 02/28/20 (Report in [facility]/PACS): IMPRESSION: Moderate to large amount of bilateral atherosclerotic plaque, right subjectively greater than left, not resulting in a hemodynamically significant stenosis within either internal carotid artery.  Reportedly last cardiac cath was in 2005, but report is not readily available. No documented interventions since 1998. His LHC/PCI report from 09/11/17 can be viewed below or in Care Everywhere.  LHC/PCI 09/11/1997 ([facility]): DIAGNOSTIC SUMMARY Dominance: Right  Coronary Artery Disease Left Main: normal. LAD system: significant. Mid LAD 95% Discrete. LCX system: not evaluated. Left Circumflex normal. RCA system: significant. Prox RCA 75% Discrete. Mid RCA 95% Discrete. Number of vessels with significant CAD: 2 - Vessel  INTERVENTIONAL SUMMARY Lesions Attempted: 3 Lesion #1: Mid RCA 95% (pre) to <25% (post) using ACS RX DUET 3.0-28 mm stent, PTCA. Lesion #2: Prox RCA 75% (pre) to <25% (post) using ACS RX DUET 3.0-18 mm stent, PTCA. Lesion #3: Mid LAD 95% (pre) to <25% (post) using AVE GFX 3.0-18 mm stent, PTCA.   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[place])    Pneumonia    Sleep apnea     Past Surgical History:  Procedure Laterality Date   BACK SURGERY     CARDIAC CATHETERIZATION     COLONOSCOPY WITH PROPOFOL N/A 12/22/2016   Procedure: COLONOSCOPY WITH PROPOFOL;  Surgeon: Lollie Sails, MD;  Location: [facility] ENDOSCOPY;  Service: Endoscopy;  Laterality: N/A;   CORONARY ANGIOPLASTY WITH STENT PLACEMENT     EYE SURGERY     LUMBAR FUSION  10/28/2009   L3-5 PLIF   Neck fusion  1995   TONSILLECTOMY      MEDICATIONS:  acetaminophen (TYLENOL) 500 MG tablet   aspirin 81 MG EC tablet   atenolol (TENORMIN) 25 MG tablet   atorvastatin  (LIPITOR) 20 MG tablet   diclofenac (VOLTAREN) 75 MG EC tablet   gabapentin (NEURONTIN) 100 MG capsule   Melatonin 1 MG CAPS   misoprostol (CYTOTEC) 200 MCG tablet   pantoprazole (PROTONIX) 40 MG tablet   No current facility-administered medications for this encounter.    Myra Gianotti, PA-C Surgical Short Stay/Anesthesiology [facility] Phone [phone] [facility] Phone [phone] 04/07/2021 4:49 PM

## 2021-04-07 NOTE — Anesthesia Preprocedure Evaluation (Addendum)
Anesthesia Evaluation  Patient identified by MRN, date of birth, ID band Patient awake    Reviewed: Allergy & Precautions, H&P , NPO status , Patient's Chart, lab work & pertinent test results, reviewed documented beta blocker date and time   Airway Mallampati: III  TM Distance: >3 FB Neck ROM: Full    Dental no notable dental hx. (+) Upper Dentures, Dental Advisory Given   Pulmonary sleep apnea , former smoker,    Pulmonary exam normal breath sounds clear to auscultation       Cardiovascular hypertension, Pt. on medications and Pt. on home beta blockers + CAD   Rhythm:Regular Rate:Normal     Neuro/Psych Anxiety negative neurological ROS     GI/Hepatic Neg liver ROS, GERD  Medicated,  Endo/Other  negative endocrine ROS  Renal/GU negative Renal ROS  negative genitourinary   Musculoskeletal  (+) Arthritis , Osteoarthritis,    Abdominal   Peds  Hematology negative hematology ROS (+)   Anesthesia Other Findings   Reproductive/Obstetrics negative OB ROS                           Anesthesia Physical Anesthesia Plan  ASA: 3  Anesthesia Plan: General   Post-op Pain Management:    Induction: Intravenous  PONV Risk Score and Plan: 3 and Ondansetron, Dexamethasone and Midazolam  Airway Management Planned: Oral ETT  Additional Equipment: Arterial line  Intra-op Plan:   Post-operative Plan: Extubation in OR  Informed Consent: I have reviewed the patients History and Physical, chart, labs and discussed the procedure including the risks, benefits and alternatives for the proposed anesthesia with the patient or authorized representative who has indicated his/her understanding and acceptance.     Dental advisory given  Plan Discussed with: CRNA  Anesthesia Plan Comments: (See PAT note written 04/07/2021 by Myra Gianotti, PA-C. )       Anesthesia Quick Evaluation

## 2021-04-08 ENCOUNTER — Inpatient Hospital Stay (HOSPITAL_COMMUNITY)
Admission: RE | Admit: 2021-04-08 | Discharge: 2021-04-10 | DRG: 460 | Disposition: A | Discharge status: Home Health | Payer: PPO | Attending: Neurological Surgery | Admitting: Neurological Surgery

## 2021-04-08 ENCOUNTER — Inpatient Hospital Stay (HOSPITAL_COMMUNITY): Payer: PPO

## 2021-04-08 ENCOUNTER — Inpatient Hospital Stay (HOSPITAL_COMMUNITY): Payer: PPO | Admitting: Vascular Surgery

## 2021-04-08 ENCOUNTER — Encounter (HOSPITAL_COMMUNITY)
Admission: RE | Disposition: A | Discharge status: Home Health | Payer: Self-pay | Source: Home / Self Care | Attending: Neurological Surgery

## 2021-04-08 ENCOUNTER — Other Ambulatory Visit: Payer: Self-pay

## 2021-04-08 ENCOUNTER — Encounter (HOSPITAL_COMMUNITY): Payer: Self-pay | Admitting: Neurological Surgery

## 2021-04-08 DIAGNOSIS — Z7982 Long term (current) use of aspirin: Secondary | ICD-10-CM

## 2021-04-08 DIAGNOSIS — M4726 Other spondylosis with radiculopathy, lumbar region: Secondary | ICD-10-CM | POA: Diagnosis not present

## 2021-04-08 DIAGNOSIS — Z981 Arthrodesis status: Secondary | ICD-10-CM

## 2021-04-08 DIAGNOSIS — Z955 Presence of coronary angioplasty implant and graft: Secondary | ICD-10-CM

## 2021-04-08 DIAGNOSIS — Z7984 Long term (current) use of oral hypoglycemic drugs: Secondary | ICD-10-CM | POA: Diagnosis not present

## 2021-04-08 DIAGNOSIS — M4056 Lordosis, unspecified, lumbar region: Secondary | ICD-10-CM | POA: Diagnosis present

## 2021-04-08 DIAGNOSIS — Z8572 Personal history of non-Hodgkin lymphomas: Secondary | ICD-10-CM | POA: Diagnosis not present

## 2021-04-08 DIAGNOSIS — F419 Anxiety disorder, unspecified: Secondary | ICD-10-CM | POA: Diagnosis present

## 2021-04-08 DIAGNOSIS — M48061 Spinal stenosis, lumbar region without neurogenic claudication: Secondary | ICD-10-CM | POA: Diagnosis not present

## 2021-04-08 DIAGNOSIS — Z87891 Personal history of nicotine dependence: Secondary | ICD-10-CM

## 2021-04-08 DIAGNOSIS — G4730 Sleep apnea, unspecified: Secondary | ICD-10-CM | POA: Diagnosis present

## 2021-04-08 DIAGNOSIS — K219 Gastro-esophageal reflux disease without esophagitis: Secondary | ICD-10-CM | POA: Diagnosis present

## 2021-04-08 DIAGNOSIS — M5416 Radiculopathy, lumbar region: Secondary | ICD-10-CM | POA: Diagnosis not present

## 2021-04-08 DIAGNOSIS — I2510 Atherosclerotic heart disease of native coronary artery without angina pectoris: Secondary | ICD-10-CM | POA: Diagnosis present

## 2021-04-08 DIAGNOSIS — M48062 Spinal stenosis, lumbar region with neurogenic claudication: Secondary | ICD-10-CM | POA: Diagnosis not present

## 2021-04-08 DIAGNOSIS — M1990 Unspecified osteoarthritis, unspecified site: Secondary | ICD-10-CM | POA: Diagnosis present

## 2021-04-08 DIAGNOSIS — I10 Essential (primary) hypertension: Secondary | ICD-10-CM | POA: Diagnosis present

## 2021-04-08 DIAGNOSIS — Z841 Family history of disorders of kidney and ureter: Secondary | ICD-10-CM | POA: Diagnosis not present

## 2021-04-08 DIAGNOSIS — E7800 Pure hypercholesterolemia, unspecified: Secondary | ICD-10-CM | POA: Diagnosis present

## 2021-04-08 DIAGNOSIS — Z79899 Other long term (current) drug therapy: Secondary | ICD-10-CM | POA: Diagnosis not present

## 2021-04-08 DIAGNOSIS — Z882 Allergy status to sulfonamides status: Secondary | ICD-10-CM

## 2021-04-08 DIAGNOSIS — Z8249 Family history of ischemic heart disease and other diseases of the circulatory system: Secondary | ICD-10-CM

## 2021-04-08 DIAGNOSIS — Z833 Family history of diabetes mellitus: Secondary | ICD-10-CM | POA: Diagnosis not present

## 2021-04-08 DIAGNOSIS — Z419 Encounter for procedure for purposes other than remedying health state, unspecified: Secondary | ICD-10-CM

## 2021-04-08 DIAGNOSIS — M4326 Fusion of spine, lumbar region: Secondary | ICD-10-CM | POA: Diagnosis not present

## 2021-04-08 DIAGNOSIS — E785 Hyperlipidemia, unspecified: Secondary | ICD-10-CM | POA: Diagnosis not present

## 2021-04-08 HISTORY — PX: ANTERIOR LAT LUMBAR FUSION: SHX1168

## 2021-04-08 HISTORY — PX: LUMBAR PERCUTANEOUS PEDICLE SCREW 2 LEVEL: SHX5561

## 2021-04-08 SURGERY — ANTERIOR LATERAL LUMBAR FUSION 2 LEVELS
Anesthesia: General

## 2021-04-08 MED ORDER — PROPOFOL 10 MG/ML IV BOLUS
INTRAVENOUS | Status: AC
  Filled 2021-04-08: qty 40

## 2021-04-08 MED ORDER — HYDROMORPHONE HCL 1 MG/ML IJ SOLN
0.5000 mg | INTRAMUSCULAR | Status: DC | PRN
  Administered 2021-04-08 – 2021-04-09 (×3): 1 mg via INTRAVENOUS
  Filled 2021-04-08 (×3): qty 1

## 2021-04-08 MED ORDER — ALUM & MAG HYDROXIDE-SIMETH 200-200-20 MG/5ML PO SUSP
30.0000 mL | Freq: Four times a day (QID) | ORAL | Status: DC | PRN
  Administered 2021-04-09: 30 mL via ORAL
  Filled 2021-04-08: qty 30

## 2021-04-08 MED ORDER — THROMBIN 5000 UNITS EX SOLR
CUTANEOUS | Status: AC
  Filled 2021-04-08: qty 5000

## 2021-04-08 MED ORDER — SUCCINYLCHOLINE CHLORIDE 200 MG/10ML IV SOSY
PREFILLED_SYRINGE | INTRAVENOUS | Status: DC | PRN
  Administered 2021-04-08: 120 mg via INTRAVENOUS

## 2021-04-08 MED ORDER — ONDANSETRON HCL 4 MG/2ML IJ SOLN
INTRAMUSCULAR | Status: DC | PRN
  Administered 2021-04-08: 4 mg via INTRAVENOUS

## 2021-04-08 MED ORDER — MISOPROSTOL 200 MCG PO TABS
200.0000 ug | ORAL_TABLET | Freq: Two times a day (BID) | ORAL | Status: DC
  Administered 2021-04-09 – 2021-04-10 (×3): 200 ug via ORAL
  Filled 2021-04-08 (×5): qty 1

## 2021-04-08 MED ORDER — HYDROMORPHONE HCL 1 MG/ML IJ SOLN
INTRAMUSCULAR | Status: AC
  Filled 2021-04-08: qty 1

## 2021-04-08 MED ORDER — CEFAZOLIN SODIUM-DEXTROSE 2-4 GM/100ML-% IV SOLN
INTRAVENOUS | Status: AC
  Administered 2021-04-08: 2 g via INTRAVENOUS
  Filled 2021-04-08: qty 100

## 2021-04-08 MED ORDER — PHENYLEPHRINE HCL (PRESSORS) 10 MG/ML IV SOLN
INTRAVENOUS | Status: AC
  Filled 2021-04-08: qty 1

## 2021-04-08 MED ORDER — DOCUSATE SODIUM 100 MG PO CAPS
100.0000 mg | ORAL_CAPSULE | Freq: Two times a day (BID) | ORAL | Status: DC
  Administered 2021-04-08 – 2021-04-10 (×4): 100 mg via ORAL
  Filled 2021-04-08 (×4): qty 1

## 2021-04-08 MED ORDER — ACETAMINOPHEN 10 MG/ML IV SOLN: 1000.0000 mg | Freq: Once | INTRAVENOUS | Status: DC

## 2021-04-08 MED ORDER — POLYETHYLENE GLYCOL 3350 17 G PO PACK: 17.0000 g | PACK | Freq: Every day | ORAL | Status: DC | PRN

## 2021-04-08 MED ORDER — LACTATED RINGERS IV SOLN: INTRAVENOUS | Status: DC

## 2021-04-08 MED ORDER — MELATONIN 3 MG PO TABS
1.5000 mg | ORAL_TABLET | Freq: Every evening | ORAL | Status: DC | PRN
  Administered 2021-04-08: 1.5 mg via ORAL
  Filled 2021-04-08: qty 1

## 2021-04-08 MED ORDER — 0.9 % SODIUM CHLORIDE (POUR BTL) OPTIME
TOPICAL | Status: DC | PRN
  Administered 2021-04-08 (×2): 1000 mL

## 2021-04-08 MED ORDER — ACETAMINOPHEN 10 MG/ML IV SOLN
INTRAVENOUS | Status: AC
  Filled 2021-04-08: qty 100

## 2021-04-08 MED ORDER — SODIUM CHLORIDE (PF) 0.9 % IJ SOLN
INTRAMUSCULAR | Status: DC | PRN
  Administered 2021-04-08: 20 mL via INTRAVENOUS

## 2021-04-08 MED ORDER — CEFAZOLIN SODIUM-DEXTROSE 2-4 GM/100ML-% IV SOLN
2.0000 g | INTRAVENOUS | Status: AC
  Administered 2021-04-08: 2 g via INTRAVENOUS

## 2021-04-08 MED ORDER — BUPIVACAINE LIPOSOME 1.3 % IJ SUSP
INTRAMUSCULAR | Status: AC
  Filled 2021-04-08: qty 20

## 2021-04-08 MED ORDER — PHENYLEPHRINE 40 MCG/ML (10ML) SYRINGE FOR IV PUSH (FOR BLOOD PRESSURE SUPPORT)
PREFILLED_SYRINGE | INTRAVENOUS | Status: AC
  Filled 2021-04-08: qty 10

## 2021-04-08 MED ORDER — FENTANYL CITRATE (PF) 250 MCG/5ML IJ SOLN
INTRAMUSCULAR | Status: DC | PRN
  Administered 2021-04-08: 50 ug via INTRAVENOUS
  Administered 2021-04-08: 100 ug via INTRAVENOUS
  Administered 2021-04-08 (×2): 50 ug via INTRAVENOUS

## 2021-04-08 MED ORDER — LIDOCAINE-EPINEPHRINE 1 %-1:100000 IJ SOLN
INTRAMUSCULAR | Status: AC
  Filled 2021-04-08: qty 1

## 2021-04-08 MED ORDER — PROPOFOL 500 MG/50ML IV EMUL
INTRAVENOUS | Status: DC | PRN
  Administered 2021-04-08: 50 ug/kg/min via INTRAVENOUS

## 2021-04-08 MED ORDER — ACETAMINOPHEN 500 MG PO TABS: 1000.0000 mg | ORAL_TABLET | Freq: Four times a day (QID) | ORAL | Status: DC | PRN

## 2021-04-08 MED ORDER — ACETAMINOPHEN 325 MG PO TABS: 650.0000 mg | ORAL_TABLET | ORAL | Status: DC | PRN

## 2021-04-08 MED ORDER — OXYCODONE-ACETAMINOPHEN 5-325 MG PO TABS
1.0000 | ORAL_TABLET | ORAL | Status: DC | PRN
  Administered 2021-04-08 – 2021-04-10 (×7): 2 via ORAL
  Filled 2021-04-08 (×7): qty 2

## 2021-04-08 MED ORDER — PROPOFOL 10 MG/ML IV BOLUS
INTRAVENOUS | Status: DC | PRN
  Administered 2021-04-08: 140 mg via INTRAVENOUS

## 2021-04-08 MED ORDER — HYDROMORPHONE HCL 1 MG/ML IJ SOLN
0.2500 mg | INTRAMUSCULAR | Status: DC | PRN
  Administered 2021-04-08 (×4): 0.5 mg via INTRAVENOUS

## 2021-04-08 MED ORDER — SODIUM CHLORIDE 0.9 % IV SOLN: 250.0000 mL | INTRAVENOUS | Status: DC

## 2021-04-08 MED ORDER — PHENOL 1.4 % MT LIQD: 1.0000 | OROMUCOSAL | Status: DC | PRN

## 2021-04-08 MED ORDER — FLEET ENEMA 7-19 GM/118ML RE ENEM: 1.0000 | ENEMA | Freq: Once | RECTAL | Status: DC | PRN

## 2021-04-08 MED ORDER — ACETAMINOPHEN 650 MG RE SUPP: 650.0000 mg | RECTAL | Status: DC | PRN

## 2021-04-08 MED ORDER — MENTHOL 3 MG MT LOZG
1.0000 | LOZENGE | OROMUCOSAL | Status: DC | PRN
  Filled 2021-04-08: qty 9

## 2021-04-08 MED ORDER — BUPIVACAINE LIPOSOME 1.3 % IJ SUSP
INTRAMUSCULAR | Status: DC | PRN
  Administered 2021-04-08: 10 mL
  Administered 2021-04-08: 20 mL

## 2021-04-08 MED ORDER — BUPIVACAINE HCL (PF) 0.5 % IJ SOLN
INTRAMUSCULAR | Status: AC
  Filled 2021-04-08: qty 30

## 2021-04-08 MED ORDER — ACETAMINOPHEN 10 MG/ML IV SOLN
1000.0000 mg | Freq: Once | INTRAVENOUS | Status: AC
  Administered 2021-04-08: 1000 mg via INTRAVENOUS

## 2021-04-08 MED ORDER — SODIUM CHLORIDE 0.9% FLUSH: 3.0000 mL | INTRAVENOUS | Status: DC | PRN

## 2021-04-08 MED ORDER — LIDOCAINE-EPINEPHRINE 1 %-1:100000 IJ SOLN
INTRAMUSCULAR | Status: DC | PRN
  Administered 2021-04-08 (×2): 5 mL

## 2021-04-08 MED ORDER — CHLORHEXIDINE GLUCONATE CLOTH 2 % EX PADS: 6.0000 | MEDICATED_PAD | Freq: Once | CUTANEOUS | Status: DC

## 2021-04-08 MED ORDER — ATENOLOL 25 MG PO TABS
25.0000 mg | ORAL_TABLET | Freq: Every day | ORAL | Status: DC
  Administered 2021-04-09 – 2021-04-10 (×2): 25 mg via ORAL
  Filled 2021-04-08 (×2): qty 1

## 2021-04-08 MED ORDER — SODIUM CHLORIDE 0.9% FLUSH
3.0000 mL | Freq: Two times a day (BID) | INTRAVENOUS | Status: DC
  Administered 2021-04-08 – 2021-04-09 (×3): 3 mL via INTRAVENOUS

## 2021-04-08 MED ORDER — HYDROMORPHONE HCL 1 MG/ML IJ SOLN
0.5000 mg | Freq: Once | INTRAMUSCULAR | Status: AC
  Administered 2021-04-08: 0.5 mg via INTRAVENOUS

## 2021-04-08 MED ORDER — EPHEDRINE SULFATE-NACL 50-0.9 MG/10ML-% IV SOSY
PREFILLED_SYRINGE | INTRAVENOUS | Status: DC | PRN
  Administered 2021-04-08 (×2): 10 mg via INTRAVENOUS

## 2021-04-08 MED ORDER — GLYCOPYRROLATE PF 0.2 MG/ML IJ SOSY
PREFILLED_SYRINGE | INTRAMUSCULAR | Status: DC | PRN
  Administered 2021-04-08: .1 mg via INTRAVENOUS

## 2021-04-08 MED ORDER — ORAL CARE MOUTH RINSE: 15.0000 mL | Freq: Once | OROMUCOSAL | Status: AC

## 2021-04-08 MED ORDER — DEXMEDETOMIDINE (PRECEDEX) IN NS 20 MCG/5ML (4 MCG/ML) IV SYRINGE
PREFILLED_SYRINGE | INTRAVENOUS | Status: DC | PRN
  Administered 2021-04-08: 4 ug via INTRAVENOUS
  Administered 2021-04-08 (×2): 8 ug via INTRAVENOUS

## 2021-04-08 MED ORDER — ONDANSETRON HCL 4 MG PO TABS: 4.0000 mg | ORAL_TABLET | Freq: Four times a day (QID) | ORAL | Status: DC | PRN

## 2021-04-08 MED ORDER — BISACODYL 10 MG RE SUPP: 10.0000 mg | Freq: Every day | RECTAL | Status: DC | PRN

## 2021-04-08 MED ORDER — PANTOPRAZOLE SODIUM 40 MG PO TBEC
40.0000 mg | DELAYED_RELEASE_TABLET | Freq: Every day | ORAL | Status: DC | PRN
  Administered 2021-04-09: 40 mg via ORAL
  Filled 2021-04-08: qty 1

## 2021-04-08 MED ORDER — DEXMEDETOMIDINE (PRECEDEX) IN NS 20 MCG/5ML (4 MCG/ML) IV SYRINGE
PREFILLED_SYRINGE | INTRAVENOUS | Status: AC
  Filled 2021-04-08: qty 5

## 2021-04-08 MED ORDER — FENTANYL CITRATE (PF) 250 MCG/5ML IJ SOLN
INTRAMUSCULAR | Status: AC
  Filled 2021-04-08: qty 5

## 2021-04-08 MED ORDER — SENNA 8.6 MG PO TABS
1.0000 | ORAL_TABLET | Freq: Two times a day (BID) | ORAL | Status: DC
  Administered 2021-04-08 – 2021-04-10 (×4): 8.6 mg via ORAL
  Filled 2021-04-08 (×4): qty 1

## 2021-04-08 MED ORDER — PHENYLEPHRINE HCL-NACL 10-0.9 MG/250ML-% IV SOLN
INTRAVENOUS | Status: DC | PRN
  Administered 2021-04-08: 30 ug/min via INTRAVENOUS

## 2021-04-08 MED ORDER — ONDANSETRON HCL 4 MG/2ML IJ SOLN: 4.0000 mg | Freq: Four times a day (QID) | INTRAMUSCULAR | Status: DC | PRN

## 2021-04-08 MED ORDER — ATORVASTATIN CALCIUM 10 MG PO TABS
20.0000 mg | ORAL_TABLET | Freq: Every day | ORAL | Status: DC
  Administered 2021-04-09 – 2021-04-10 (×2): 20 mg via ORAL
  Filled 2021-04-08 (×2): qty 2

## 2021-04-08 MED ORDER — CHLORHEXIDINE GLUCONATE CLOTH 2 % EX PADS
6.0000 | MEDICATED_PAD | Freq: Every day | CUTANEOUS | Status: DC
  Administered 2021-04-09: 6 via TOPICAL

## 2021-04-08 MED ORDER — CHLORHEXIDINE GLUCONATE 0.12 % MT SOLN: 15.0000 mL | Freq: Once | OROMUCOSAL | Status: AC

## 2021-04-08 MED ORDER — GABAPENTIN 100 MG PO CAPS
100.0000 mg | ORAL_CAPSULE | Freq: Three times a day (TID) | ORAL | Status: DC
  Administered 2021-04-08 – 2021-04-10 (×5): 100 mg via ORAL
  Filled 2021-04-08 (×5): qty 1

## 2021-04-08 MED ORDER — HYDROMORPHONE HCL 1 MG/ML IJ SOLN
INTRAMUSCULAR | Status: AC
  Administered 2021-04-08: 0.5 mg
  Filled 2021-04-08: qty 1

## 2021-04-08 MED ORDER — ACETAMINOPHEN 500 MG PO TABS
1000.0000 mg | ORAL_TABLET | Freq: Once | ORAL | Status: AC
  Administered 2021-04-08: 1000 mg via ORAL
  Filled 2021-04-08: qty 2

## 2021-04-08 MED ORDER — DEXAMETHASONE SODIUM PHOSPHATE 10 MG/ML IJ SOLN
INTRAMUSCULAR | Status: DC | PRN
  Administered 2021-04-08: 10 mg via INTRAVENOUS

## 2021-04-08 MED ORDER — KETOROLAC TROMETHAMINE 15 MG/ML IJ SOLN
7.5000 mg | Freq: Four times a day (QID) | INTRAMUSCULAR | Status: AC
  Administered 2021-04-08 – 2021-04-09 (×4): 7.5 mg via INTRAVENOUS
  Filled 2021-04-08 (×4): qty 1

## 2021-04-08 MED ORDER — LIDOCAINE 2% (20 MG/ML) 5 ML SYRINGE
INTRAMUSCULAR | Status: DC | PRN
  Administered 2021-04-08: 60 mg via INTRAVENOUS

## 2021-04-08 MED ORDER — BUPIVACAINE HCL (PF) 0.5 % IJ SOLN
INTRAMUSCULAR | Status: DC | PRN
  Administered 2021-04-08 (×2): 5 mL

## 2021-04-08 MED ORDER — CHLORHEXIDINE GLUCONATE 0.12 % MT SOLN
OROMUCOSAL | Status: AC
  Administered 2021-04-08: 15 mL via OROMUCOSAL
  Filled 2021-04-08: qty 15

## 2021-04-08 MED ORDER — METHOCARBAMOL 500 MG PO TABS
500.0000 mg | ORAL_TABLET | Freq: Four times a day (QID) | ORAL | Status: DC | PRN
  Administered 2021-04-08 – 2021-04-10 (×5): 500 mg via ORAL
  Filled 2021-04-08 (×5): qty 1

## 2021-04-08 MED ORDER — THROMBIN 5000 UNITS EX SOLR
OROMUCOSAL | Status: DC | PRN
  Administered 2021-04-08: 5 mL via TOPICAL

## 2021-04-08 MED ORDER — METHOCARBAMOL 1000 MG/10ML IJ SOLN
500.0000 mg | Freq: Four times a day (QID) | INTRAVENOUS | Status: DC | PRN
  Filled 2021-04-08: qty 5

## 2021-04-08 MED ORDER — CEFAZOLIN SODIUM-DEXTROSE 2-4 GM/100ML-% IV SOLN
2.0000 g | Freq: Three times a day (TID) | INTRAVENOUS | Status: AC
  Administered 2021-04-09: 2 g via INTRAVENOUS
  Filled 2021-04-08 (×2): qty 100

## 2021-04-08 SURGICAL SUPPLY — 55 items
BAG COUNTER SPONGE SURGICOUNT (BAG) ×4 IMPLANT
BLADE CLIPPER SURG (BLADE) ×2 IMPLANT
BONE MATRIX OSTEOCEL PRO MED (Bone Implant) ×6 IMPLANT
CLIP NEUROVISION LG (CLIP) ×2 IMPLANT
CNTNR URN SCR LID CUP LEK RST (MISCELLANEOUS) ×1 IMPLANT
CONT SPEC 4OZ STRL OR WHT (MISCELLANEOUS) ×2
COVER BACK TABLE 60X90IN (DRAPES) ×2 IMPLANT
DERMABOND ADVANCED (GAUZE/BANDAGES/DRESSINGS) ×2
DERMABOND ADVANCED .7 DNX12 (GAUZE/BANDAGES/DRESSINGS) ×2 IMPLANT
DRAPE C-ARM 42X72 X-RAY (DRAPES) ×4 IMPLANT
DRAPE C-ARMOR (DRAPES) ×4 IMPLANT
DRAPE LAPAROTOMY 100X72X124 (DRAPES) ×4 IMPLANT
DURAPREP 26ML APPLICATOR (WOUND CARE) ×4 IMPLANT
ELECT BLADE 6.5 EXT (BLADE) ×2 IMPLANT
ELECT REM PT RETURN 9FT ADLT (ELECTROSURGICAL) ×4
ELECTRODE REM PT RTRN 9FT ADLT (ELECTROSURGICAL) ×2 IMPLANT
GAUZE 4X4 16PLY ~~LOC~~+RFID DBL (SPONGE) ×4 IMPLANT
GLOVE EXAM NITRILE XL STR (GLOVE) IMPLANT
GLOVE SURG LTX SZ8.5 (GLOVE) ×6 IMPLANT
GLOVE SURG UNDER POLY LF SZ8.5 (GLOVE) ×6 IMPLANT
GOWN STRL REUS W/ TWL LRG LVL3 (GOWN DISPOSABLE) ×2 IMPLANT
GOWN STRL REUS W/ TWL XL LVL3 (GOWN DISPOSABLE) ×1 IMPLANT
GOWN STRL REUS W/TWL 2XL LVL3 (GOWN DISPOSABLE) ×4 IMPLANT
GOWN STRL REUS W/TWL LRG LVL3 (GOWN DISPOSABLE) ×4
GOWN STRL REUS W/TWL XL LVL3 (GOWN DISPOSABLE) ×2
GUIDEWIRE NITINOL BEVEL TIP (WIRE) ×8 IMPLANT
HEMOSTAT POWDER KIT SURGIFOAM (HEMOSTASIS) ×2 IMPLANT
KIT BASIN OR (CUSTOM PROCEDURE TRAY) ×2 IMPLANT
KIT DILATOR XLIF 5 (KITS) ×1 IMPLANT
KIT SURGICAL ACCESS MAXCESS 4 (KITS) ×2 IMPLANT
KIT TURNOVER KIT B (KITS) ×4 IMPLANT
KIT XLIF (KITS) ×1
MARKER SKIN DUAL TIP RULER LAB (MISCELLANEOUS) ×2 IMPLANT
MODULE NVM5 NEXT GEN EMG (NEEDLE) ×2 IMPLANT
MODULUS XLW 12X22X55MM 10 (Spine Construct) ×4 IMPLANT
NEEDLE HYPO 21X1.5 SAFETY (NEEDLE) ×4 IMPLANT
NEEDLE HYPO 25X1 1.5 SAFETY (NEEDLE) ×4 IMPLANT
NEEDLE I-PASS III (NEEDLE) ×2 IMPLANT
NS IRRIG 1000ML POUR BTL (IV SOLUTION) ×4 IMPLANT
PACK LAMINECTOMY NEURO (CUSTOM PROCEDURE TRAY) ×4 IMPLANT
PAD ARMBOARD 7.5X6 YLW CONV (MISCELLANEOUS) ×6 IMPLANT
ROD RELINE MAS LORD 5.5X75MM (Rod) ×2 IMPLANT
ROD SPINAL 5.5X80 TI LORDOSE (Rod) ×2 IMPLANT
SCREW LOCK RELINE 5.5 TULIP (Screw) ×12 IMPLANT
SCREW RELINE RED 6.5X45MM POLY (Screw) ×12 IMPLANT
SPONGE T-LAP 4X18 ~~LOC~~+RFID (SPONGE) ×4 IMPLANT
SUT VIC AB 2-0 CP2 18 (SUTURE) ×4 IMPLANT
SUT VIC AB 3-0 SH 8-18 (SUTURE) ×6 IMPLANT
SUT VIC AB 4-0 RB1 18 (SUTURE) ×4 IMPLANT
SYR 20ML LL LF (SYRINGE) ×4 IMPLANT
TAPE CLOTH 4X10 WHT NS (GAUZE/BANDAGES/DRESSINGS) ×4 IMPLANT
TOWEL GREEN STERILE (TOWEL DISPOSABLE) ×4 IMPLANT
TOWEL GREEN STERILE FF (TOWEL DISPOSABLE) ×4 IMPLANT
TRAY FOLEY MTR SLVR 16FR STAT (SET/KITS/TRAYS/PACK) ×2 IMPLANT
WATER STERILE IRR 1000ML POUR (IV SOLUTION) ×2 IMPLANT

## 2021-04-08 NOTE — Transfer of Care (Signed)
Immediate Anesthesia Transfer of Care Note  Patient: Manuel Cook  Procedure(s) Performed: Lumbar one-two Lumbar two-three Anterolateral lumbar interbody fusion with posterior percutaneous fixation Lumbar one to Lumbar three, removal of old hardware at Lumbar four-five [place] ONE TO THREE  Patient Location: PACU  Anesthesia Type:General  Level of Consciousness: drowsy and patient cooperative  Airway & Oxygen Therapy: Patient Spontanous Breathing and Patient connected to face mask oxygen  Post-op Assessment: Report given to RN and Post -op Vital signs reviewed and stable  Post vital signs: Reviewed and stable  Last Vitals:  Vitals Value Taken Time  BP 147/62 04/08/21 1747  Temp    Pulse 71 04/08/21 1752  Resp 30 04/08/21 1752  SpO2 100 % 04/08/21 1752  Vitals shown include unvalidated device data.  Last Pain:  Vitals:   04/08/21 0856  TempSrc:   PainSc: 4       Patients Stated Pain Goal: 4 (32/95/18 8416)  Complications: No notable events documented.

## 2021-04-08 NOTE — Anesthesia Procedure Notes (Signed)
Arterial Line Insertion Start/End7/08/2021 10:20 AM, 04/08/2021 10:35 AM Performed by: Imagene Riches, CRNA, CRNA  Patient location: Pre-op. Preanesthetic checklist: patient identified, IV checked, site marked, risks and benefits discussed, surgical consent, monitors and equipment checked, pre-op evaluation, timeout performed and anesthesia consent Left, radial was placed Catheter size: 20 G Hand hygiene performed , maximum sterile barriers used  and Seldinger technique used Allen's test indicative of satisfactory collateral circulation Attempts: 1 Procedure performed without using ultrasound guided technique. Following insertion, dressing applied and Biopatch. Post procedure assessment: normal  Patient tolerated the procedure well with no immediate complications.

## 2021-04-08 NOTE — Op Note (Signed)
Date of surgery: 04/08/2021 Preoperative diagnosis lumbar spinal stenosis L1-L2 3.  History of decompression and fusion L3 to's L5. Postoperative diagnosis: Same Procedure: Anterolateral indirect decompression with XLIF technique L1-2 and L2-3 allograft arthrodesis with ostia cell.  Titanium interbody spacer at L1-2 and L2-3.  Posterior percutaneous pedicle screw placement L1 L3 with removal of hardware from L3-L5.  Fluoroscopic visualization.  EMG neuro monitoring. Surgeon: Kristeen Miss Anesthesia: General endotracheal Indications: Manuel Cook is a 75 year old male has had progressive weakening in his lower extremities and loss of stamina on his feet for the last couple of years.  Planning to have surgery around the time COVID started because of this process he had put it off he notes that despite efforts at conservative management his condition is worsening.  Procedure: Patient was brought to the operating room supine on the stretcher.  After the smooth induction of general endotracheal anesthesia he was carefully placed into the right lateral decubitus position.  His body was taped in the orthogonal position this was checked fluoroscopically.  The table had a slight break placed and it and the area of entry for the L1-2 and L2-3 spaces was marked on the skin on the left lateral side.  The skin was cleansed with alcohol DuraPrep and draped in a sterile fashion.  The skin was infiltrated with 1% lidocaine with 1/2% Marcaine in a 50-50 mixture.  The dissection was taken down through the fascia.  Area just beneath the 11th rib was then chosen and this was entered into the retroperitoneal space.  I probe was passed over the interspace at L to 3 and a K wire was passed into the disc space.  Its positioning was verified and EMG monitoring was performed to make sure no elements of the lumbar plexus were being irritated.  When this was verified a series of dilators was placed over this area and again EMG  monitoring was performed.  The disc space was then entered using a 15 blade to open up the lateral aspect of the disc and a series of curettes and rongeurs were then used to open the entirety of the disc and to place a Cobb elevator across the disc space and open the contralateral ligament.  A series of dilators were then placed into the interspace and ultimately was felt that a 12 x 22 x 55 mm spacer with 10 degrees lordosis would fit well into the interval once adequate decortication was performed.  Titanium spacer was filled with ostia cell and this was placed under direct fluoroscopic visualization.  Again EMG monitoring was performed also and no irritability of the lumbar elements was noted.  Attention was then turned to L1 and L2 and the same process was repeated here the same size spacer 12 x 22 x 55 mm with 10 degrees lordosis was ultimately chosen after adequate decortication of the endplates.  The spacer was filled with ostia cell allograft and placed into the interspace.  Hemostasis was then checked in the soft tissues and final x-rays were obtained in AP and lateral projection.  The retractors were removed and then the deep fascia was closed with 2-0 Vicryl in interrupted fashion and 3-0 Vicryl was used in subcuticular skin prior to final closure though 20 cc of long-acting Marcaine was injected into the paraspinous fascia on the left side.  The patient was then taken from the right lateral decubitus position and placed prone onto a [place] table.  The back was prepped with alcohol DuraPrep and draped in  a sterile fashion using fluoroscopic guidance we localized the areas of the pedicles of L1-L2.  Previous hardware from L3-L5 was then also localized and the skin was marked after infiltrating with lidocaine and Marcaine solution small incision was made over the center portion of the hardware and a Metrix retractor was then placed over the center screw in this area was dilated to an 18 mm size.   Ultimately this root caps were released at L3-L4 and L5 on 1 side and then the other transverse connector was also involved and this was released and transverse connector was removed after some dissection of it.  The rods were then individually released.  The screws were then removed from L5 and L4 and the L3 screws were removed but the opening was preserved to replace this with a new NuVasive 6.5 x 45 mm screw at L3.  Pedicle entry sites were then chosen at L1 and L2 using Jamshidi technique and the Jamshidi was driven into the pedicle and into the vertebral body of L1 and L2.  K wires were used to mark the pedicle entry sites.  Then using EMG monitoring 6.5 x 45 mm screws were placed into L1 and L2 no irritability of lumbar elements were noted no cut out was identified.  Once the screws were placed a standard contour rod measuring 7 5 mm in length was placed on the left side and a standard contour 80 mm rod was placed on the right.  These were tightened in a neutral construct.  Final radiographs were obtained with AP and lateral positioning confirming a normal lordotic curve.  Once this was verified all retractors were removed the deep fascia was closed with 2-0 Vicryl in interrupted fashion 3-0 Vicryl was used in the subcuticular tissues and the final subcuticular closure was performed with 4-0 Vicryl.  30 cc of Exparel was then injected into the paraspinous fascia and tissues at the surgical site posteriorly.  Blood loss for the entire procedure was estimated at 100 cc after final closure patient was returned to recovery room in stable condition.

## 2021-04-08 NOTE — Anesthesia Procedure Notes (Addendum)
Procedure Name: Intubation Date/Time: 04/08/2021 1:02 PM Performed by: Erick Colace, RN Pre-anesthesia Checklist: Patient identified, Emergency Drugs available, Suction available and Patient being monitored Patient Re-evaluated:Patient Re-evaluated prior to induction Oxygen Delivery Method: Circle system utilized Preoxygenation: Pre-oxygenation with 100% oxygen Induction Type: IV induction Ventilation: Mask ventilation without difficulty Laryngoscope Size: Glidescope and 4 Grade View: Grade I Tube type: Oral Tube size: 7.5 mm Number of attempts: 1 Airway Equipment and Method: Stylet and Oral airway Placement Confirmation: ETT inserted through vocal cords under direct vision, positive ETCO2 and breath sounds checked- equal and bilateral Secured at: 22 cm Tube secured with: Tape Dental Injury: Teeth and Oropharynx as per pre-operative assessment

## 2021-04-08 NOTE — H&P (Signed)
Manuel Cook is an 75 y.o. male.   Chief Complaint: back pain and bilateral leg pain and weakness. HPI: patient is a 75 year old individual who over 10 years ago had decompression and fusion of L3-4 L4-5 .  Patient did well after surgerybut here recently he's been having increasing back pain with leg weakness proximally.  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  Having failed efforts at conservative management over the past year and a half he's been advised regarding surgical intervention decompress and stabilize using an excellent technique.  Is to have the surgery before coated but he put off because of the process and now is ready to have this process repaired.  His condition and situation has remained essentially stable.  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[place])    Pneumonia    Sleep apnea     Past Surgical History:  Procedure Laterality Date   BACK SURGERY     CARDIAC CATHETERIZATION     COLONOSCOPY WITH PROPOFOL N/A 12/22/2016   Procedure: COLONOSCOPY WITH PROPOFOL;  Surgeon: Lollie Sails, MD;  Location: [facility] ENDOSCOPY;  Service: Endoscopy;  Laterality: N/A;   CORONARY ANGIOPLASTY WITH STENT PLACEMENT     EYE SURGERY     LUMBAR FUSION  10/28/2009   L3-5 PLIF   Neck fusion  1995   TONSILLECTOMY      Family History  Problem Relation Age of Onset   Kidney failure Brother    Heart attack Father        Brother   Diabetes Mellitus II Brother    Prostate cancer Neg Hx    Social History:  reports that he has quit smoking. He has never used smokeless tobacco. He reports current alcohol use. He reports that he does not use drugs.  Allergies:  Allergies  Allergen Reactions   Sulfa Antibiotics     Unknown reaction    Medications Prior to Admission  Medication Sig  Dispense Refill   aspirin 81 MG EC tablet Take 81 mg by mouth daily.       atenolol (TENORMIN) 25 MG tablet Take by mouth daily.     atorvastatin (LIPITOR) 20 MG tablet Take 20 mg by mouth daily.     diclofenac (VOLTAREN) 75 MG EC tablet Take 75 mg by mouth 2 (two) times daily.     gabapentin (NEURONTIN) 100 MG capsule Take 100 mg by mouth 3 (three) times daily.     Melatonin 1 MG CAPS Take 1 mg by mouth at bedtime as needed (sleep).     misoprostol (CYTOTEC) 200 MCG tablet Take 200 mcg by mouth 2 (two) times daily.     pantoprazole (PROTONIX) 40 MG tablet Take 40 mg by mouth daily as needed (acid reflux).     acetaminophen (TYLENOL) 500 MG tablet Take 1,000 mg by mouth every 6 (six) hours as needed for moderate pain or headache.      No results found for this or any previous visit (from the past 48 hour(s)). No results found.  Review of Systems  Constitutional:  Positive for activity change.  HENT: Negative.    Eyes: Negative.   Respiratory: Negative.    Cardiovascular: Negative.   Endocrine: Negative.   Genitourinary:  Negative.   Musculoskeletal:  Positive for back pain, myalgias and neck pain.  Allergic/Immunologic: Negative.   Neurological:  Positive for weakness.  Hematological: Negative.   Psychiatric/Behavioral: Negative.     Blood pressure (!) 216/73, pulse 60, temperature 97.6 F (36.4 C), temperature source Oral, resp. rate 17, height 5\' 6"  (1.676 m), weight 96.2 kg, SpO2 97 %. Physical Exam Constitutional:      Appearance: Normal appearance.  HENT:     Head: Normocephalic and atraumatic.     Nose: Nose normal.     Mouth/Throat:     Mouth: Mucous membranes are moist.  Eyes:     Extraocular Movements: Extraocular movements intact.     Conjunctiva/sclera: Conjunctivae normal.     Pupils: Pupils are equal, round, and reactive to light.  Cardiovascular:     Rate and Rhythm: Normal rate and regular rhythm.  Pulmonary:     Effort: Pulmonary effort is normal.      Breath sounds: Normal breath sounds.  Abdominal:     General: Abdomen is flat.     Palpations: Abdomen is soft.  Musculoskeletal:     Cervical back: Neck supple.     Comments: Positive straight leg raising bilaterally at 30.  Patrick's maneuver is negative.  Proximal leg strength diminished with 4 out of 5 strength in iliopsoas and quadriceps.  Tibialis anterior and gastrocsoleus normal.  Absent deep tendon reflexes in the patella and Achilles.  Skin:    General: Skin is warm and dry.  Neurological:     Mental Status: He is alert.     Comments: Cranial nerve examination is normal.  Upper extremity strength reveals modest weakness in the intrinsics of both hands.  Positive Tinel's over both wrists.  Deep tendon reflexes are decreased in the biceps and triceps.  Lower extremity strength reveals proximal leg weakness in iliopsoas quadriceps.  Absent patellar and Achilles reflexes.  Decreased muscle tone distal lower extremities also noted.  Station and gait reveal wide-base gait.  Psychiatric:        Mood and Affect: Mood normal.        Behavior: Behavior normal.        Thought Content: Thought content normal.        Judgment: Judgment normal.     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  History of fusion L3-4 and L4-5.  Plan: Decompression fusion L1-2 and L2-3 with excellent technique posterior fixation from L1-L3.  Removal of hardware from L3-L5.  Earleen Newport, MD 04/08/2021, 10:42 AM

## 2021-04-09 ENCOUNTER — Encounter (HOSPITAL_COMMUNITY): Payer: Self-pay | Admitting: Neurological Surgery

## 2021-04-09 LAB — CBC
HCT: 43.8 % (ref 39.0–52.0)
Hemoglobin: 15 g/dL (ref 13.0–17.0)
MCH: 33.8 pg (ref 26.0–34.0)
MCHC: 34.2 g/dL (ref 30.0–36.0)
MCV: 98.6 fL (ref 80.0–100.0)
Platelets: 228 10*3/uL (ref 150–400)
RBC: 4.44 MIL/uL (ref 4.22–5.81)
RDW: 13.1 % (ref 11.5–15.5)
WBC: 13.5 10*3/uL — ABNORMAL HIGH (ref 4.0–10.5)
nRBC: 0 % (ref 0.0–0.2)

## 2021-04-09 LAB — BASIC METABOLIC PANEL
Anion gap: 11 (ref 5–15)
BUN: 23 mg/dL (ref 8–23)
CO2: 27 mmol/L (ref 22–32)
Calcium: 9.2 mg/dL (ref 8.9–10.3)
Chloride: 100 mmol/L (ref 98–111)
Creatinine, Ser: 1.57 mg/dL — ABNORMAL HIGH (ref 0.61–1.24)
GFR, Estimated: 46 mL/min — ABNORMAL LOW (ref >60)
Glucose, Bld: 143 mg/dL — ABNORMAL HIGH (ref 70–99)
Potassium: 5 mmol/L (ref 3.5–5.1)
Sodium: 138 mmol/L (ref 135–145)

## 2021-04-09 NOTE — Plan of Care (Signed)
  Problem: Clinical Measurements: Goal: Ability to maintain clinical measurements within normal limits will improve Outcome: Progressing Goal: Postoperative complications will be avoided or minimized Outcome: Progressing   

## 2021-04-09 NOTE — Plan of Care (Signed)
  Problem: Activity: Goal: Will remain free from falls Outcome: Progressing

## 2021-04-09 NOTE — Progress Notes (Signed)
This Probation officer was assisting patient in the bathroom when noted by patient that there is a tick on his right forearm. Patient assessed for more ticks but non noted. Tick placed in a sample container and [employer] as well as Therapist, sports notified. Tick was picked up by [employer]. Patient is stable at this time.

## 2021-04-09 NOTE — Anesthesia Postprocedure Evaluation (Signed)
Anesthesia Post Note  Patient: Manuel Cook  Procedure(s) Performed: Lumbar one-two Lumbar two-three Anterolateral lumbar interbody fusion with posterior percutaneous fixation Lumbar one to Lumbar three, removal of old hardware at Lumbar four-five LUMBAR PERCUTANEOUS PEDICLE SCREW LUMBAR ONE TO THREE     Patient location during evaluation: Other Anesthesia Type: General Level of consciousness: awake and alert Pain management: pain level controlled Vital Signs Assessment: post-procedure vital signs reviewed and stable Respiratory status: spontaneous breathing, nonlabored ventilation and respiratory function stable Cardiovascular status: blood pressure returned to baseline and stable Postop Assessment: no apparent nausea or vomiting Anesthetic complications: no   No notable events documented.  Last Vitals:  Vitals:   04/09/21 0342 04/09/21 0742  BP: (!) 161/77 (!) 166/73  Pulse: 96 83  Resp: 18 18  Temp: 36.9 C 36.6 C  SpO2: 97% 93%    Last Pain:  Vitals:   04/09/21 0955  TempSrc:   PainSc: 8                  Kateline Kinkade,W. EDMOND

## 2021-04-09 NOTE — Progress Notes (Signed)
Patient ID: Manuel Cook, male   DOB: [birth year], 75 y.o.   MRN: [id number] Patient displaying some mild anxiety though vital signs are stable.  Notes he is uncomfortable but his pain is under good control otherwise.  Motor function appears intact.  His Foley catheter is just been removed.  Complains of a significantly dry mouth which is likely result from the anesthetic.  We will continue to monitor his clinical status.  He notes he had very little sleep last night.  This will hopefully improve as the day passes.  We will encourage physical therapy to ambulate and walk with him.  His incisions remain clean and dry.

## 2021-04-09 NOTE — Progress Notes (Signed)
Orthopedic Tech Progress Note Patient Details:  Manuel Cook Feb 02, 1946 [id number]  Ortho Devices Type of Ortho Device: Lumbar corsett Ortho Device/Splint Location: back Ortho Device/Splint Interventions: Ordered   Post Interventions Patient Tolerated: Other (comment) Instructions Provided: Other (comment)  [name] 04/09/2021, 3:07 AM

## 2021-04-09 NOTE — Evaluation (Signed)
Physical Therapy Evaluation Patient Details Name: Manuel Cook MRN: [id number] DOB: 01-24-1946 Today's Date: 04/09/2021   History of Present Illness  Pt is a 75 yr old male who 10 years ago had decompression and fusion of L3-4 L4-5. 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 Pt now s/p Anterolateral indirect decompression with XLIF technique L1-2 and L2-3 allograft arthrodesis with ostia cell,  titanium interbody spacer at L1-2 and L2-3,  posterior percutaneous pedicle screw placement L1 L3 with removal of hardware from L3-L5. PMH: anxiety, CAD, sleep apena  Clinical Impression   Patient is s/p above surgery resulting in functional limitations due to the deficits listed below (see PT Problem List). Comes from home where he lives with his wife in a multilevel log home; has the option to stay on main level with bedroom and full bathroom; the bed he prefers to sleep in is upstairs; Needs occasional assist at home with mobility and ADLs prior to this admission due to back pain; presents to PT with  generalized weakness, motion restrictions/back precautions postop, decr activity tolerance; Cognition and attention seem much improved this session compared to earlier with OT, and pt needed overall min assist and mod cues and reinforcement of precautions; I anticipate good progress, and should be able to dc home tomorrow; Patient will benefit from skilled PT to increase their independence and safety with mobility to allow discharge to the venue listed below.       Follow Up Recommendations Home health PT;Supervision/Assistance - 24 hour    Equipment Recommendations  3in1 (PT)    Recommendations for Other Services OT consult (as ordered)     Precautions / Restrictions Precautions Precautions: Back Precaution Booklet Issued: Yes (comment) Precaution Comments: due to decrease in attention requires cues for saftey Required Braces or Orthoses: Spinal Brace Spinal  Brace: Lumbar corset      Mobility  Bed Mobility Overal bed mobility: Needs Assistance Bed Mobility: Rolling;Sidelying to Sit Rolling: Min assist Sidelying to sit: Min guard;HOB elevated       General bed mobility comments: Used bedrail; HOB slightly elevated to approx the  bed he will likely initially use at home; Slow moving, with struggle and valsalva    Transfers Overall transfer level: Needs assistance Equipment used: Rolling walker (2 wheeled) Transfers: Sit to/from Stand Sit to Stand: Min guard         General transfer comment: Cues for hand placement adn safety  Ambulation/Gait Ambulation/Gait assistance: Min guard;Supervision Gait Distance (Feet): 200 Feet Assistive device: Rolling walker (2 wheeled) Gait Pattern/deviations: Step-through pattern;Decreased step length - right;Decreased step length - left     General Gait Details: mInguard assist initailly , proggressing to supervision; Good use of RW overall with mild cueing for RW proximity  Stairs         General stair comments: We discussed options for stairs to access his preferred bedroom, inclusing going up semi-sideways, with both hands on rail  Wheelchair Mobility    Modified Rankin (Stroke Patients Only)       Balance Overall balance assessment: Mild deficits observed, not formally tested                                           Pertinent Vitals/Pain Pain Assessment: Faces Faces Pain Scale: Hurts little more Pain Location: low back Pain Descriptors / Indicators: Aching;Operative site guarding Pain  Intervention(s): Monitored during session    Home Living Family/patient expects to be discharged to:: Private residence Living Arrangements: Spouse/significant other Available Help at Discharge: Family Type of Home: House Home Access: Stairs to enter Entrance Stairs-Rails: Right Entrance Stairs-Number of Steps: 3 Home Layout: Multi-level;Able to live on main level with  bedroom/bathroom (bedroom downstairs; also considering sleeping in recliner) Home Equipment: Walker - 2 wheels;Shower seat;Cane - single point      Prior Function Level of Independence: Needs assistance   Gait / Transfers Assistance Needed: pt said sometimes due to discomfort  ADL's / Homemaking Assistance Needed: per pt socks and shoes sometimes; diffculty with pericare at times -- recently installed bidets        Hand Dominance   Dominant Hand: Right    Extremity/Trunk Assessment   Upper Extremity Assessment Upper Extremity Assessment: Defer to OT evaluation    Lower Extremity Assessment Lower Extremity Assessment: Generalized weakness    Cervical / Trunk Assessment Cervical / Trunk Assessment: Other exceptions (s/p sx)  Communication   Communication: No difficulties  Cognition Arousal/Alertness: Awake/alert Behavior During Therapy: WFL for tasks assessed/performed Overall Cognitive Status: Within Functional Limits for tasks assessed                                 General Comments: much improved form earlier in the day      General Comments General comments (skin integrity, edema, etc.): Wife present and supportive    Exercises     Assessment/Plan    PT Assessment Patient needs continued PT services  PT Problem List Decreased strength;Decreased activity tolerance;Decreased mobility;Decreased knowledge of use of DME;Decreased safety awareness;Decreased knowledge of precautions;Obesity;Pain       PT Treatment Interventions DME instruction;Gait training;Stair training;Functional mobility training;Therapeutic activities;Therapeutic exercise;Patient/family education;Balance training    PT Goals (Current goals can be found in the Care Plan section)  Acute Rehab PT Goals Patient Stated Goal: to have decrease pain PT Goal Formulation: With patient Time For Goal Achievement: 04/23/21 Potential to Achieve Goals: Good Additional Goals Additional Goal  #1: Pt will ascend/descend 12 steps using rail on L to simulate accessing upstairs bedroom    Frequency Min 5X/week   Barriers to discharge Other (comment) Can use bedroom on main level    Co-evaluation               AM-PAC PT "6 Clicks" Mobility  Outcome Measure Help needed turning from your back to your side while in a flat bed without using bedrails?: A Little Help needed moving from lying on your back to sitting on the side of a flat bed without using bedrails?: A Little Help needed moving to and from a bed to a chair (including a wheelchair)?: A Little Help needed standing up from a chair using your arms (e.g., wheelchair or bedside chair)?: A Little Help needed to walk in hospital room?: A Little Help needed climbing 3-5 steps with a railing? : A Lot 6 Click Score: 17    End of Session Equipment Utilized During Treatment: Back brace Activity Tolerance: Patient tolerated treatment well Patient left: Other (comment) (walking bathroom to recliner with wife assist) Nurse Communication: Mobility status PT Visit Diagnosis: Other abnormalities of gait and mobility (R26.89)    Time: 1050-1130 PT Time Calculation (min) (ACUTE ONLY): 40 min   Charges:   PT Evaluation $PT Eval Low Complexity: 1 Low PT Treatments $Gait Training: 8-22 mins $Therapeutic Activity: 8-22 mins  Roney [place], [place]  Acute Rehabilitation Services Pager [phone] Office [phone]   Colletta [place] 04/09/2021, 12:02 PM

## 2021-04-09 NOTE — Progress Notes (Signed)
Patient remained restless all throughout the night. He would not adhere to precautions. He rolled around the bed despite several attempts to educate and prevent him from twisting and turning. He was alert and oriented to self, place and time; however he was lethargic and confused at times.

## 2021-04-09 NOTE — Evaluation (Signed)
Occupational Therapy Evaluation Patient Details Name: Manuel Cook MRN: [id number] DOB: [birth year] Today's Date: 04/09/2021    History of Present Illness Pt is a 75 yr old male who 10 years ago had decompression and fusion of L3-4 L4-5. 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 Pt now s/p Anterolateral indirect decompression with XLIF technique L1-2 and L2-3 allograft arthrodesis with ostia cell,  titanium interbody spacer at L1-2 and L2-3,  posterior percutaneous pedicle screw placement L1 L3 with removal of hardware from L3-L5. PMH: anxiety, CAD, sleep apena   Clinical Impression   Patient in session required redirection to tasks as suspected due to medications.  Pt reports they live with their wife and they will be able to live on the main level of the home. Pt was able to complete sit to stand transfers with min guard, side stepping with FW at EOB with min guard, LE ADLS with moderate assist and sitting to supine with moderate assist.  Patient will benefit from skilled OT to increase their safety and independence with ADL and functional mobility for ADL (while adhering to their precautions) to facilitate discharge to venue listed below.      Follow Up Recommendations  Home health OT;Supervision/Assistance - 24 hour    Equipment Recommendations       Recommendations for Other Services       Precautions / Restrictions Precautions Precautions: Back Precaution Booklet Issued: Yes (comment) Precaution Comments: due to decrease in attention requires cues for saftey Required Braces or Orthoses: Spinal Brace Spinal Brace: Lumbar corset Restrictions Weight Bearing Restrictions: No      Mobility Bed Mobility Overal bed mobility: Needs Assistance Bed Mobility: Sit to Supine       Sit to supine: Mod assist   General bed mobility comments: pt requires max cues due to saftey    Transfers Overall transfer level: Needs assistance Equipment  used: Rolling walker (2 wheeled) Transfers: Sit to/from Stand Sit to Stand: Min guard              Balance                                           ADL either performed or assessed with clinical judgement   ADL Overall ADL's : Needs assistance/impaired Eating/Feeding: Supervision/ safety;Sitting   Grooming: Wash/dry hands;Wash/dry face;Set up;Sitting   Upper Body Bathing: Set up;Sitting   Lower Body Bathing: Cueing for safety;Cueing for sequencing;Sit to/from stand;Moderate assistance   Upper Body Dressing : Set up;Sitting   Lower Body Dressing: Sit to/from stand;Moderate assistance   Toilet Transfer: Min guard;Cueing for safety;Cueing for sequencing;Regular Toilet   Toileting- Clothing Manipulation and Hygiene: Min guard;Cueing for safety;Cueing for sequencing;Sit to/from stand         General ADL Comments: Pt requires cues to due to decrease in attention     Vision Baseline Vision/History: Wears glasses Wears Glasses: Reading only Patient Visual Report: No change from baseline       Perception     Praxis      Pertinent Vitals/Pain Pain Assessment: 0-10 Pain Score: 7  Pain Descriptors / Indicators: Operative site guarding Pain Intervention(s): Limited activity within patient's tolerance     Hand Dominance Right   Extremity/Trunk Assessment Upper Extremity Assessment Upper Extremity Assessment: Overall WFL for tasks assessed   Lower Extremity Assessment Lower Extremity Assessment: Defer to  PT evaluation   Cervical / Trunk Assessment Cervical / Trunk Assessment: Other exceptions (s/p sx)   Communication Communication Communication: No difficulties   Cognition Arousal/Alertness: Suspect due to medications Behavior During Therapy:  (pt fluquated in session as in the begining impulsive then became lethargic) Overall Cognitive Status: No family/caregiver present to determine baseline cognitive functioning                                      General Comments  pt is HOH and reports they should have hearing aides    Exercises     Shoulder Instructions      Home Living Family/patient expects to be discharged to:: Private residence Living Arrangements: Spouse/significant other Available Help at Discharge: Family Type of Home: House Home Access: Stairs to enter Technical brewer of Steps: 3 Entrance Stairs-Rails: Right Home Layout: Multi-level;Able to live on main level with bedroom/bathroom     Bathroom Shower/Tub: Walk-in shower (on main level)   Biochemist, clinical: Standard Bathroom Accessibility: Yes   Home Equipment: Environmental consultant - 2 wheels;Shower seat;Cane - single point          Prior Functioning/Environment Level of Independence: Needs assistance  Gait / Transfers Assistance Needed: pt said sometimes due to discomfort ADL's / Homemaking Assistance Needed: per pt socks and shoes sometimes            OT Problem List: Decreased activity tolerance;Impaired balance (sitting and/or standing);Decreased safety awareness;Decreased knowledge of use of DME or AE;Decreased knowledge of precautions;Pain      OT Treatment/Interventions: Self-care/ADL training;DME and/or AE instruction;Therapeutic activities;Patient/family education;Balance training    OT Goals(Current goals can be found in the care plan section) Acute Rehab OT Goals Patient Stated Goal: to have decrease pain OT Goal Formulation: With patient Time For Goal Achievement: 04/19/21 Potential to Achieve Goals: Good ADL Goals Pt Will Perform Upper Body Bathing: with modified independence;sitting Pt Will Perform Lower Body Bathing: with min guard assist;sit to/from stand Pt Will Perform Tub/Shower Transfer: with min guard assist;ambulating;shower seat  OT Frequency: Min 2X/week   Barriers to D/C:            Co-evaluation              AM-PAC OT "6 Clicks" Daily Activity     Outcome Measure Help from another person  eating meals?: None Help from another person taking care of personal grooming?: A Little Help from another person toileting, which includes using toliet, bedpan, or urinal?: A Little Help from another person bathing (including washing, rinsing, drying)?: A Lot Help from another person to put on and taking off regular upper body clothing?: A Little Help from another person to put on and taking off regular lower body clothing?: A Lot 6 Click Score: 17   End of Session Equipment Utilized During Treatment: Gait belt;Rolling walker;Back brace Nurse Communication: Other (comment) (decrease ability to stay awake at the end of session)  Activity Tolerance: Other (comment) (medications) Patient left: in bed;with bed alarm set;with call bell/phone within reach  OT Visit Diagnosis: Unsteadiness on feet (R26.81);Other abnormalities of gait and mobility (R26.89);Muscle weakness (generalized) (M62.81);Pain Pain - part of body:  (back)                Time: 3546-5681 OT Time Calculation (min): 37 min Charges:  OT General Charges $OT Visit: 1 Visit OT Evaluation $OT Eval Low Complexity: 1 Low OT Treatments $Self Care/Home Management :  8-22 mins  Joeseph Amor OTR/L  Acute Rehab Services  [phone] office number [phone] pager number   Joeseph Amor 04/09/2021, 8:43 AM

## 2021-04-10 MED ORDER — METHOCARBAMOL 500 MG PO TABS: 500.0000 mg | ORAL_TABLET | Freq: Four times a day (QID) | ORAL | 3 refills | Status: DC | PRN

## 2021-04-10 MED ORDER — OXYCODONE-ACETAMINOPHEN 5-325 MG PO TABS: 1.0000 | ORAL_TABLET | ORAL | 0 refills | Status: DC | PRN

## 2021-04-10 MED FILL — Sodium Chloride IV Soln 0.9%: INTRAVENOUS | Qty: 2000 | Status: AC

## 2021-04-10 MED FILL — Heparin Sodium (Porcine) Inj 1000 Unit/ML: INTRAMUSCULAR | Qty: 60 | Status: AC

## 2021-04-10 NOTE — Discharge Summary (Signed)
Physician Discharge Summary  Patient ID: Manuel Cook MRN: [id number] DOB/AGE: 1946-08-29 75 y.o.  Admit date: 04/08/2021 Discharge date: 04/10/2021  Admission Diagnoses: Lumbar stenosis L1-L2 3 history of fusion L3-L5.  Neurogenic claudication.  Lumbar radiculopathy.  Discharge Diagnoses: Lumbar stenosis L1 to, L2-3.  History of fusion L3-L5.  Neurogenic claudication.  Lumbar radiculopathy.   Active Problems:   Degenerative lumbar spinal stenosis   Discharged Condition: good  Hospital Course: Patient was admitted to undergo surgical decompression and stabilization from L1-L3.  He tolerated surgery well.  Consults: None  Significant Diagnostic Studies: None  Treatments: surgery: See op note  Discharge Exam: Blood pressure (!) 161/54, pulse 72, temperature 97.7 F (36.5 C), temperature source Oral, resp. rate 18, height 5\' 6"  (1.676 m), weight 96.2 kg, SpO2 96 %. Incision is clean and dry motor function is intact and upper and lower extremities.  Station and gait is intact.  Disposition: Discharge disposition: 01-Home or Self Care       Discharge Instructions     Call MD for:  redness, tenderness, or signs of infection (pain, swelling, redness, odor or green/yellow discharge around incision site)   Complete by: As directed    Call MD for:  severe uncontrolled pain   Complete by: As directed    Call MD for:  temperature >100.4   Complete by: As directed    Diet - low sodium heart healthy   Complete by: As directed    Diet - low sodium heart healthy   Complete by: As directed    Discharge wound care:   Complete by: As directed    Okay to shower, after showering paint incision with Betadine and place gauze dressing over incisions.  Once incisions stay and remain dry no dressing is necessary.  No lifting greater than 10 pounds.  No driving until after first postoperative visit   Discharge wound care:   Complete by: As directed    Okay to shower.  After showering paint  incision with Betadine and cover with gauze until incisions remain dry.  Then no dressing is necessary.  No lifting greater than 10 pounds.  No driving until first postoperative visit   Incentive spirometry RT   Complete by: As directed    Increase activity slowly   Complete by: As directed    Increase activity slowly   Complete by: As directed       Allergies as of 04/10/2021       Reactions   Sulfa Antibiotics    Unknown reaction        Medication List     TAKE these medications    acetaminophen 500 MG tablet Commonly known as: TYLENOL Take 1,000 mg by mouth every 6 (six) hours as needed for moderate pain or headache.   aspirin 81 MG EC tablet Take 81 mg by mouth daily.   atenolol 25 MG tablet Commonly known as: TENORMIN Take by mouth daily.   atorvastatin 20 MG tablet Commonly known as: LIPITOR Take 20 mg by mouth daily.   diclofenac 75 MG EC tablet Commonly known as: VOLTAREN Take 75 mg by mouth 2 (two) times daily.   gabapentin 100 MG capsule Commonly known as: NEURONTIN Take 100 mg by mouth 3 (three) times daily.   Melatonin 1 MG Caps Take 1 mg by mouth at bedtime as needed (sleep).   methocarbamol 500 MG tablet Commonly known as: ROBAXIN Take 1 tablet (500 mg total) by mouth every 6 (six) hours as needed for  muscle spasms.   misoprostol 200 MCG tablet Commonly known as: CYTOTEC Take 200 mcg by mouth 2 (two) times daily.   oxyCODONE-acetaminophen 5-325 MG tablet Commonly known as: PERCOCET/ROXICET Take 1-2 tablets by mouth every 4 (four) hours as needed for moderate pain or severe pain.   pantoprazole 40 MG tablet Commonly known as: PROTONIX Take 40 mg by mouth daily as needed (acid reflux).               Discharge Care Instructions  (From admission, onward)           Start     Ordered   04/10/21 0000  Discharge wound care:       Comments: Okay to shower, after showering paint incision with Betadine and place gauze dressing over  incisions.  Once incisions stay and remain dry no dressing is necessary.  No lifting greater than 10 pounds.  No driving until after first postoperative visit   04/10/21 1418   04/10/21 0000  Discharge wound care:       Comments: Okay to shower.  After showering paint incision with Betadine and cover with gauze until incisions remain dry.  Then no dressing is necessary.  No lifting greater than 10 pounds.  No driving until first postoperative visit   04/10/21 1420            Follow-up Information     [facility] Follow up.   Why: Your home health has been set up with [place]. The office will call you with the start of service information. If you have any questions please call number listed above. Contact information: [phone]                Signed: Earleen Newport 04/10/2021, 2:20 PM

## 2021-04-10 NOTE — Progress Notes (Signed)
Physical Therapy Treatment Patient Details Name: Manuel Cook MRN: [id number] DOB: [birth year] Today's Date: 04/10/2021    History of Present Illness Pt is a 75 y/o male who presents s/p Anterolateral indirect decompression with XLIF technique L1-3 with removal of hardware from L3-L5. PMH: anxiety, CAD, sleep apnea    PT Comments    Pt progressing well with post-op mobility. He was able to demonstrate transfers and ambulation with gross min guard assist to supervision for safety with RW for support. Pt reports increased pain after hallway ambulation and had difficulty finding a comfortable position in the bed. Pt states the waist band on his shorts was pressing on the incision area and increasing his pain. Shorts adjusted below incision and RN was notified of pt request for pain meds. Pt was educated on precautions, brace application/wearing schedule, appropriate activity progression, and car transfer. Will continue to follow.      Follow Up Recommendations  Home health PT;Supervision/Assistance - 24 hour     Equipment Recommendations  3in1 (PT)    Recommendations for Other Services       Precautions / Restrictions Precautions Precautions: Back Precaution Booklet Issued: Yes (comment) Precaution Comments: Reviewed handout and pt was cued for precautions during functional mobility. Required Braces or Orthoses: Spinal Brace Spinal Brace: Lumbar corset Restrictions Weight Bearing Restrictions: No    Mobility  Bed Mobility Overal bed mobility: Needs Assistance Bed Mobility: Rolling;Sit to Sidelying Rolling: Modified independent (Device/Increase time)       Sit to sidelying: Supervision General bed mobility comments: VC's for optimal log roll technique.    Transfers Overall transfer level: Needs assistance Equipment used: Rolling walker (2 wheeled) Transfers: Sit to/from Stand Sit to Stand: Min guard         General transfer comment: Cues for hand placement and  safety  Ambulation/Gait Ambulation/Gait assistance: Min guard;Supervision Gait Distance (Feet): 250 Feet Assistive device: Rolling walker (2 wheeled) Gait Pattern/deviations: Step-through pattern;Decreased step length - right;Decreased step length - left Gait velocity: Decreased Gait velocity interpretation: <1.8 ft/sec, indicate of risk for recurrent falls General Gait Details: min guard assist initailly, proggressing to supervision; Good use of RW overall with frequent cues for improved posture and closer walker proximity. Pt reporting pain in B triceps into shoulders and attributes this to walker use. Encouraged decreased weight bearing through UE's on the walker.   Stairs Stairs: Yes Stairs assistance: Min guard Stair Management: One rail Right;Step to pattern;Forwards Number of Stairs: 3 General stair comments: Attempted sideways however pt unable to advance with LLE first utilizing R railing. Pt was able to complete stair negotiation with RLE leading up and LLE leading down.   Wheelchair Mobility    Modified Rankin (Stroke Patients Only)       Balance Overall balance assessment: Mild deficits observed, not formally tested                                          Cognition Arousal/Alertness: Awake/alert Behavior During Therapy: WFL for tasks assessed/performed Overall Cognitive Status: Within Functional Limits for tasks assessed                                        Exercises      General Comments        Pertinent Vitals/Pain Pain Assessment:  Faces Faces Pain Scale: Hurts whole lot Pain Location: low back Pain Descriptors / Indicators: Aching;Operative site guarding;Sore Pain Intervention(s): Limited activity within patient's tolerance;Monitored during session;Repositioned;Patient requesting pain meds-RN notified    Home Living                      Prior Function            PT Goals (current goals can now be  found in the care plan section) Acute Rehab PT Goals Patient Stated Goal: to have decrease pain PT Goal Formulation: With patient Time For Goal Achievement: 04/23/21 Potential to Achieve Goals: Good Progress towards PT goals: Progressing toward goals    Frequency    Min 5X/week      PT Plan Current plan remains appropriate    Co-evaluation              AM-PAC PT "6 Clicks" Mobility   Outcome Measure  Help needed turning from your back to your side while in a flat bed without using bedrails?: None Help needed moving from lying on your back to sitting on the side of a flat bed without using bedrails?: A Little Help needed moving to and from a bed to a chair (including a wheelchair)?: A Little Help needed standing up from a chair using your arms (e.g., wheelchair or bedside chair)?: A Little Help needed to walk in hospital room?: A Little Help needed climbing 3-5 steps with a railing? : A Little 6 Click Score: 19    End of Session Equipment Utilized During Treatment: Back brace Activity Tolerance: Patient tolerated treatment well Patient left: in bed;with call bell/phone within reach Nurse Communication: Mobility status PT Visit Diagnosis: Other abnormalities of gait and mobility (R26.89)     Time: 2841-3244 PT Time Calculation (min) (ACUTE ONLY): 24 min  Charges:  $Gait Training: 23-37 mins                     Rolinda Roan, PT, DPT Acute Rehabilitation Services Pager: [phone] Office: [phone]    Thelma Comp 04/10/2021, 1:35 PM

## 2021-04-10 NOTE — Progress Notes (Signed)
NURSING PROGRESS NOTE  Manuel Cook [id number] Discharge Data: 04/10/2021 4:21 PM Attending Provider: Kristeen Miss, MD Manuel Cook, Manuel Labella, MD     Manuel Cook to be D/C'd Home per MD order.  Discussed with the patient the After Visit Summary and all questions fully answered. All IV's discontinued with no bleeding noted. All belongings returned to patient for patient to take home.   Last Vital Signs:  Blood pressure (!) 161/54, pulse 72, temperature 97.7 F (36.5 C), temperature source Oral, resp. rate 18, height 5\' 6"  (1.676 m), weight 96.2 kg, SpO2 96 %.  Discharge Medication List Allergies as of 04/10/2021       Reactions   Sulfa Antibiotics    Unknown reaction        Medication List     TAKE these medications    acetaminophen 500 MG tablet Commonly known as: TYLENOL Take 1,000 mg by mouth every 6 (six) hours as needed for moderate pain or headache.   aspirin 81 MG EC tablet Take 81 mg by mouth daily.   atenolol 25 MG tablet Commonly known as: TENORMIN Take by mouth daily.   atorvastatin 20 MG tablet Commonly known as: LIPITOR Take 20 mg by mouth daily.   diclofenac 75 MG EC tablet Commonly known as: VOLTAREN Take 75 mg by mouth 2 (two) times daily.   gabapentin 100 MG capsule Commonly known as: NEURONTIN Take 100 mg by mouth 3 (three) times daily.   Melatonin 1 MG Caps Take 1 mg by mouth at bedtime as needed (sleep).   methocarbamol 500 MG tablet Commonly known as: ROBAXIN Take 1 tablet (500 mg total) by mouth every 6 (six) hours as needed for muscle spasms.   misoprostol 200 MCG tablet Commonly known as: CYTOTEC Take 200 mcg by mouth 2 (two) times daily.   oxyCODONE-acetaminophen 5-325 MG tablet Commonly known as: PERCOCET/ROXICET Take 1-2 tablets by mouth every 4 (four) hours as needed for moderate pain or severe pain.   pantoprazole 40 MG tablet Commonly known as: PROTONIX Take 40 mg by mouth daily as needed (acid reflux).                Discharge Care Instructions  (From admission, onward)           Start     Ordered   04/10/21 0000  Discharge wound care:       Comments: Okay to shower, after showering paint incision with Betadine and place gauze dressing over incisions.  Once incisions stay and remain dry no dressing is necessary.  No lifting greater than 10 pounds.  No driving until after first postoperative visit   04/10/21 1418   04/10/21 0000  Discharge wound care:       Comments: Okay to shower.  After showering paint incision with Betadine and cover with gauze until incisions remain dry.  Then no dressing is necessary.  No lifting greater than 10 pounds.  No driving until first postoperative visit   04/10/21 1420

## 2021-04-10 NOTE — Plan of Care (Signed)
°  Problem: Education: °Goal: Ability to verbalize activity precautions or restrictions will improve °Outcome: Adequate for Discharge °Goal: Knowledge of the prescribed therapeutic regimen will improve °Outcome: Adequate for Discharge °Goal: Understanding of discharge needs will improve °Outcome: Adequate for Discharge °  °Problem: Activity: °Goal: Ability to avoid complications of mobility impairment will improve °Outcome: Adequate for Discharge °Goal: Ability to tolerate increased activity will improve °Outcome: Adequate for Discharge °Goal: Will remain free from falls °Outcome: Adequate for Discharge °  °Problem: Bowel/Gastric: °Goal: Gastrointestinal status for postoperative course will improve °Outcome: Adequate for Discharge °  °

## 2021-04-10 NOTE — Progress Notes (Signed)
Occupational Therapy Treatment Patient Details Name: Manuel Cook MRN: [id number] DOB: 1946-04-07 Today's Date: 04/10/2021    History of present illness Pt is a 75 yr old male who 10 years ago had decompression and fusion of L3-4 L4-5. 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 Pt now s/p Anterolateral indirect decompression with XLIF technique L1-2 and L2-3 allograft arthrodesis with ostia cell,  titanium interbody spacer at L1-2 and L2-3,  posterior percutaneous pedicle screw placement L1 L3 with removal of hardware from L3-L5. PMH: anxiety, CAD, sleep apena   OT comments  Pt admitted with see above. Pt presented today with increase in ability to attend to therapist in session. Pt completed bed mobility with cues on log roll technique and min guard and ambulation with min guard and RW with room/bathroom.  Pt was able to complete LB dressing with min assist and UE dressing with supervision to min assist as cueing pt on how to complete. Pt currently with functional limitations due to the deficits listed below (see OT Problem List).  Pt will benefit from skilled OT to increase their safety and independence with ADL and functional mobility for ADL to facilitate discharge to venue listed below.    Follow Up Recommendations  Home health OT;Supervision - Intermittent    Equipment Recommendations  Tub/shower seat    Recommendations for Other Services      Precautions / Restrictions Precautions Precautions: Back Precaution Booklet Issued: Yes (comment) Precaution Comments: Pt had increase in awareness to precautions Required Braces or Orthoses: Spinal Brace Spinal Brace: Lumbar corset Restrictions Weight Bearing Restrictions: No       Mobility Bed Mobility Overal bed mobility: Needs Assistance Bed Mobility: Rolling;Sidelying to Sit Rolling: Min guard Sidelying to sit: Min guard;HOB elevated Supine to sit: Min guard           Transfers Overall transfer level: Needs assistance Equipment used: Rolling walker (2 wheeled) Transfers: Sit to/from Stand Sit to Stand: Min guard         General transfer comment: Cues for hand placement and safety    Balance                                           ADL either performed or assessed with clinical judgement   ADL Overall ADL's : Needs assistance/impaired Eating/Feeding: Independent;Sitting   Grooming: Wash/dry hands;Wash/dry face;Supervision/safety;Standing   Upper Body Bathing: Supervision/ safety;Sitting   Lower Body Bathing: Minimal assistance;Cueing for safety;Cueing for sequencing;Sit to/from stand   Upper Body Dressing : Supervision/safety;Sitting;Standing   Lower Body Dressing: Minimal assistance;Cueing for safety;Cueing for sequencing;Sit to/from stand   Toilet Transfer: Min guard;Cueing for safety;Cueing for sequencing;Grab bars   Toileting- Water quality scientist and Hygiene: Cueing for safety;Cueing for sequencing;Supervision/safety;Sit to/from stand   Tub/ Banker: Supervision/safety;Cueing for safety;Cueing for sequencing   Functional mobility during ADLs: Min guard;Cueing for safety;Rolling walker;Cueing for sequencing General ADL Comments: Pt was able to use walker and attend to therapist in session     Vision       Perception     Praxis      Cognition Arousal/Alertness: Awake/alert Behavior During Therapy: [facility] for tasks assessed/performed Overall Cognitive Status: Within Functional Limits for tasks assessed  General Comments: improved from last OT session        Exercises     Shoulder Instructions       General Comments      Pertinent Vitals/ Pain       Pain Assessment: 0-10 Pain Score: 4  Pain Location: low back Pain Descriptors / Indicators: Aching;Operative site guarding Pain Intervention(s): Limited activity within patient's  tolerance  Home Living                                          Prior Functioning/Environment              Frequency  Min 2X/week        Progress Toward Goals  OT Goals(current goals can now be found in the care plan section)  Progress towards OT goals: Progressing toward goals  Acute Rehab OT Goals Patient Stated Goal: to have decrease pain OT Goal Formulation: With patient Time For Goal Achievement: 04/19/21 Potential to Achieve Goals: Good ADL Goals Pt Will Perform Upper Body Bathing: with modified independence;sitting Pt Will Perform Lower Body Bathing: with min guard assist;sit to/from stand Pt Will Perform Tub/Shower Transfer: with min guard assist;ambulating;shower seat  Plan Discharge plan remains appropriate    Co-evaluation                 AM-PAC OT "6 Clicks" Daily Activity     Outcome Measure   Help from another person eating meals?: None Help from another person taking care of personal grooming?: None Help from another person toileting, which includes using toliet, bedpan, or urinal?: A Little Help from another person bathing (including washing, rinsing, drying)?: A Little Help from another person to put on and taking off regular upper body clothing?: A Little Help from another person to put on and taking off regular lower body clothing?: A Little 6 Click Score: 20    End of Session Equipment Utilized During Treatment: Gait belt;Rolling walker;Back brace  OT Visit Diagnosis: Unsteadiness on feet (R26.81);Other abnormalities of gait and mobility (R26.89);Muscle weakness (generalized) (M62.81);Pain Pain - part of body:  (back)   Activity Tolerance Patient limited by pain   Patient Left in chair;with call bell/phone within reach   Nurse Communication Mobility status        Time: 4536-4680 OT Time Calculation (min): 28 min  Charges: OT General Charges $OT Visit: 1 Visit OT Treatments $Self Care/Home Management :  23-37 mins  Joeseph Amor OTR/L  Acute Rehab Services  [phone] office number [phone] pager number    Joeseph Amor 04/10/2021, 11:24 AM

## 2021-04-10 NOTE — TOC Progression Note (Signed)
Transition of Care [facility]) - Progression Note    Patient Details  Name: Manuel Cook MRN: [id number] Date of Birth: 10/02/1945  Transition of Care [facility]) CM/SW [place], RN Phone Number:[phone]  04/10/2021, 11:35 AM  Clinical Narrative:   CM at bedside to offer choice. [place] set up with [place]. No other needs noted at this time. TOC will sign off.      Barriers to Discharge: No Barriers Identified  Expected Discharge Plan and Services                           DME Arranged: N/A DME Agency: NA       HH Arranged: PT, OT [place] Agency: [place] ([facility]) Date [place]: 04/10/21 Time [place]: 1133 Representative spoke with at [place]: [place] ([place]) Interventions    Readmission Risk Interventions No flowsheet data found.

## 2021-04-11 DIAGNOSIS — J449 Chronic obstructive pulmonary disease, unspecified: Secondary | ICD-10-CM | POA: Diagnosis not present

## 2021-04-11 DIAGNOSIS — Z981 Arthrodesis status: Secondary | ICD-10-CM | POA: Diagnosis not present

## 2021-04-11 DIAGNOSIS — Z9181 History of falling: Secondary | ICD-10-CM | POA: Diagnosis not present

## 2021-04-11 DIAGNOSIS — Z9079 Acquired absence of other genital organ(s): Secondary | ICD-10-CM | POA: Diagnosis not present

## 2021-04-11 DIAGNOSIS — E785 Hyperlipidemia, unspecified: Secondary | ICD-10-CM | POA: Diagnosis not present

## 2021-04-11 DIAGNOSIS — Z87891 Personal history of nicotine dependence: Secondary | ICD-10-CM | POA: Diagnosis not present

## 2021-04-11 DIAGNOSIS — M4726 Other spondylosis with radiculopathy, lumbar region: Secondary | ICD-10-CM | POA: Diagnosis not present

## 2021-04-11 DIAGNOSIS — K219 Gastro-esophageal reflux disease without esophagitis: Secondary | ICD-10-CM | POA: Diagnosis not present

## 2021-04-11 DIAGNOSIS — Z8551 Personal history of malignant neoplasm of bladder: Secondary | ICD-10-CM | POA: Diagnosis not present

## 2021-04-11 DIAGNOSIS — M1990 Unspecified osteoarthritis, unspecified site: Secondary | ICD-10-CM | POA: Diagnosis not present

## 2021-04-11 DIAGNOSIS — M48062 Spinal stenosis, lumbar region with neurogenic claudication: Secondary | ICD-10-CM | POA: Diagnosis not present

## 2021-04-11 DIAGNOSIS — N182 Chronic kidney disease, stage 2 (mild): Secondary | ICD-10-CM | POA: Diagnosis not present

## 2021-04-11 DIAGNOSIS — Z7982 Long term (current) use of aspirin: Secondary | ICD-10-CM | POA: Diagnosis not present

## 2021-04-11 DIAGNOSIS — I129 Hypertensive chronic kidney disease with stage 1 through stage 4 chronic kidney disease, or unspecified chronic kidney disease: Secondary | ICD-10-CM | POA: Diagnosis not present

## 2021-04-11 DIAGNOSIS — Z8619 Personal history of other infectious and parasitic diseases: Secondary | ICD-10-CM | POA: Diagnosis not present

## 2021-04-11 DIAGNOSIS — Z79891 Long term (current) use of opiate analgesic: Secondary | ICD-10-CM | POA: Diagnosis not present

## 2021-04-11 DIAGNOSIS — I2510 Atherosclerotic heart disease of native coronary artery without angina pectoris: Secondary | ICD-10-CM | POA: Diagnosis not present

## 2021-04-11 DIAGNOSIS — Z955 Presence of coronary angioplasty implant and graft: Secondary | ICD-10-CM | POA: Diagnosis not present

## 2021-04-11 DIAGNOSIS — Z8701 Personal history of pneumonia (recurrent): Secondary | ICD-10-CM | POA: Diagnosis not present

## 2021-04-11 DIAGNOSIS — Z8546 Personal history of malignant neoplasm of prostate: Secondary | ICD-10-CM | POA: Diagnosis not present

## 2021-04-11 DIAGNOSIS — Z4789 Encounter for other orthopedic aftercare: Secondary | ICD-10-CM | POA: Diagnosis not present

## 2021-04-11 DIAGNOSIS — F4310 Post-traumatic stress disorder, unspecified: Secondary | ICD-10-CM | POA: Diagnosis not present

## 2021-04-11 DIAGNOSIS — Z791 Long term (current) use of non-steroidal anti-inflammatories (NSAID): Secondary | ICD-10-CM | POA: Diagnosis not present

## 2021-04-11 DIAGNOSIS — G4730 Sleep apnea, unspecified: Secondary | ICD-10-CM | POA: Diagnosis not present

## 2021-04-11 DIAGNOSIS — M542 Cervicalgia: Secondary | ICD-10-CM | POA: Diagnosis not present

## 2021-04-30 DIAGNOSIS — F4310 Post-traumatic stress disorder, unspecified: Secondary | ICD-10-CM | POA: Diagnosis not present

## 2021-04-30 DIAGNOSIS — M48062 Spinal stenosis, lumbar region with neurogenic claudication: Secondary | ICD-10-CM | POA: Diagnosis not present

## 2021-04-30 DIAGNOSIS — I2510 Atherosclerotic heart disease of native coronary artery without angina pectoris: Secondary | ICD-10-CM | POA: Diagnosis not present

## 2021-04-30 DIAGNOSIS — G4730 Sleep apnea, unspecified: Secondary | ICD-10-CM | POA: Diagnosis not present

## 2021-04-30 DIAGNOSIS — M542 Cervicalgia: Secondary | ICD-10-CM | POA: Diagnosis not present

## 2021-04-30 DIAGNOSIS — E785 Hyperlipidemia, unspecified: Secondary | ICD-10-CM | POA: Diagnosis not present

## 2021-04-30 DIAGNOSIS — Z955 Presence of coronary angioplasty implant and graft: Secondary | ICD-10-CM | POA: Diagnosis not present

## 2021-04-30 DIAGNOSIS — Z8546 Personal history of malignant neoplasm of prostate: Secondary | ICD-10-CM | POA: Diagnosis not present

## 2021-04-30 DIAGNOSIS — Z9181 History of falling: Secondary | ICD-10-CM | POA: Diagnosis not present

## 2021-04-30 DIAGNOSIS — Z8701 Personal history of pneumonia (recurrent): Secondary | ICD-10-CM | POA: Diagnosis not present

## 2021-04-30 DIAGNOSIS — K219 Gastro-esophageal reflux disease without esophagitis: Secondary | ICD-10-CM | POA: Diagnosis not present

## 2021-04-30 DIAGNOSIS — Z8551 Personal history of malignant neoplasm of bladder: Secondary | ICD-10-CM | POA: Diagnosis not present

## 2021-04-30 DIAGNOSIS — Z79891 Long term (current) use of opiate analgesic: Secondary | ICD-10-CM | POA: Diagnosis not present

## 2021-04-30 DIAGNOSIS — Z87891 Personal history of nicotine dependence: Secondary | ICD-10-CM | POA: Diagnosis not present

## 2021-04-30 DIAGNOSIS — M1990 Unspecified osteoarthritis, unspecified site: Secondary | ICD-10-CM | POA: Diagnosis not present

## 2021-04-30 DIAGNOSIS — Z981 Arthrodesis status: Secondary | ICD-10-CM | POA: Diagnosis not present

## 2021-04-30 DIAGNOSIS — I129 Hypertensive chronic kidney disease with stage 1 through stage 4 chronic kidney disease, or unspecified chronic kidney disease: Secondary | ICD-10-CM | POA: Diagnosis not present

## 2021-04-30 DIAGNOSIS — M4726 Other spondylosis with radiculopathy, lumbar region: Secondary | ICD-10-CM | POA: Diagnosis not present

## 2021-04-30 DIAGNOSIS — J449 Chronic obstructive pulmonary disease, unspecified: Secondary | ICD-10-CM | POA: Diagnosis not present

## 2021-04-30 DIAGNOSIS — N182 Chronic kidney disease, stage 2 (mild): Secondary | ICD-10-CM | POA: Diagnosis not present

## 2021-04-30 DIAGNOSIS — Z8619 Personal history of other infectious and parasitic diseases: Secondary | ICD-10-CM | POA: Diagnosis not present

## 2021-04-30 DIAGNOSIS — Z9079 Acquired absence of other genital organ(s): Secondary | ICD-10-CM | POA: Diagnosis not present

## 2021-04-30 DIAGNOSIS — Z4789 Encounter for other orthopedic aftercare: Secondary | ICD-10-CM | POA: Diagnosis not present

## 2021-04-30 DIAGNOSIS — Z7982 Long term (current) use of aspirin: Secondary | ICD-10-CM | POA: Diagnosis not present

## 2021-04-30 DIAGNOSIS — Z791 Long term (current) use of non-steroidal anti-inflammatories (NSAID): Secondary | ICD-10-CM | POA: Diagnosis not present

## 2021-05-01 DIAGNOSIS — G4733 Obstructive sleep apnea (adult) (pediatric): Secondary | ICD-10-CM | POA: Diagnosis not present

## 2021-05-07 DIAGNOSIS — M48061 Spinal stenosis, lumbar region without neurogenic claudication: Secondary | ICD-10-CM | POA: Diagnosis not present

## 2021-05-08 DIAGNOSIS — Z9181 History of falling: Secondary | ICD-10-CM | POA: Diagnosis not present

## 2021-05-08 DIAGNOSIS — Z8551 Personal history of malignant neoplasm of bladder: Secondary | ICD-10-CM | POA: Diagnosis not present

## 2021-05-08 DIAGNOSIS — Z7982 Long term (current) use of aspirin: Secondary | ICD-10-CM | POA: Diagnosis not present

## 2021-05-08 DIAGNOSIS — Z955 Presence of coronary angioplasty implant and graft: Secondary | ICD-10-CM | POA: Diagnosis not present

## 2021-05-08 DIAGNOSIS — M4726 Other spondylosis with radiculopathy, lumbar region: Secondary | ICD-10-CM | POA: Diagnosis not present

## 2021-05-08 DIAGNOSIS — Z8546 Personal history of malignant neoplasm of prostate: Secondary | ICD-10-CM | POA: Diagnosis not present

## 2021-05-08 DIAGNOSIS — Z4789 Encounter for other orthopedic aftercare: Secondary | ICD-10-CM | POA: Diagnosis not present

## 2021-05-08 DIAGNOSIS — Z791 Long term (current) use of non-steroidal anti-inflammatories (NSAID): Secondary | ICD-10-CM | POA: Diagnosis not present

## 2021-05-08 DIAGNOSIS — K219 Gastro-esophageal reflux disease without esophagitis: Secondary | ICD-10-CM | POA: Diagnosis not present

## 2021-05-08 DIAGNOSIS — M48062 Spinal stenosis, lumbar region with neurogenic claudication: Secondary | ICD-10-CM | POA: Diagnosis not present

## 2021-05-08 DIAGNOSIS — I2510 Atherosclerotic heart disease of native coronary artery without angina pectoris: Secondary | ICD-10-CM | POA: Diagnosis not present

## 2021-05-08 DIAGNOSIS — J449 Chronic obstructive pulmonary disease, unspecified: Secondary | ICD-10-CM | POA: Diagnosis not present

## 2021-05-08 DIAGNOSIS — Z87891 Personal history of nicotine dependence: Secondary | ICD-10-CM | POA: Diagnosis not present

## 2021-05-08 DIAGNOSIS — F4310 Post-traumatic stress disorder, unspecified: Secondary | ICD-10-CM | POA: Diagnosis not present

## 2021-05-08 DIAGNOSIS — I129 Hypertensive chronic kidney disease with stage 1 through stage 4 chronic kidney disease, or unspecified chronic kidney disease: Secondary | ICD-10-CM | POA: Diagnosis not present

## 2021-05-08 DIAGNOSIS — M542 Cervicalgia: Secondary | ICD-10-CM | POA: Diagnosis not present

## 2021-05-08 DIAGNOSIS — Z9079 Acquired absence of other genital organ(s): Secondary | ICD-10-CM | POA: Diagnosis not present

## 2021-05-08 DIAGNOSIS — Z981 Arthrodesis status: Secondary | ICD-10-CM | POA: Diagnosis not present

## 2021-05-08 DIAGNOSIS — Z8619 Personal history of other infectious and parasitic diseases: Secondary | ICD-10-CM | POA: Diagnosis not present

## 2021-05-08 DIAGNOSIS — G4730 Sleep apnea, unspecified: Secondary | ICD-10-CM | POA: Diagnosis not present

## 2021-05-08 DIAGNOSIS — N182 Chronic kidney disease, stage 2 (mild): Secondary | ICD-10-CM | POA: Diagnosis not present

## 2021-05-08 DIAGNOSIS — M1990 Unspecified osteoarthritis, unspecified site: Secondary | ICD-10-CM | POA: Diagnosis not present

## 2021-05-08 DIAGNOSIS — Z8701 Personal history of pneumonia (recurrent): Secondary | ICD-10-CM | POA: Diagnosis not present

## 2021-05-08 DIAGNOSIS — E785 Hyperlipidemia, unspecified: Secondary | ICD-10-CM | POA: Diagnosis not present

## 2021-05-08 DIAGNOSIS — Z79891 Long term (current) use of opiate analgesic: Secondary | ICD-10-CM | POA: Diagnosis not present

## 2021-06-11 DIAGNOSIS — M19042 Primary osteoarthritis, left hand: Secondary | ICD-10-CM | POA: Diagnosis not present

## 2021-06-11 DIAGNOSIS — R1319 Other dysphagia: Secondary | ICD-10-CM | POA: Diagnosis not present

## 2021-06-11 DIAGNOSIS — E7849 Other hyperlipidemia: Secondary | ICD-10-CM | POA: Diagnosis not present

## 2021-06-11 DIAGNOSIS — Z981 Arthrodesis status: Secondary | ICD-10-CM | POA: Diagnosis not present

## 2021-06-11 DIAGNOSIS — I2510 Atherosclerotic heart disease of native coronary artery without angina pectoris: Secondary | ICD-10-CM | POA: Diagnosis not present

## 2021-06-11 DIAGNOSIS — Z955 Presence of coronary angioplasty implant and graft: Secondary | ICD-10-CM | POA: Diagnosis not present

## 2021-06-11 DIAGNOSIS — R6883 Chills (without fever): Secondary | ICD-10-CM | POA: Diagnosis not present

## 2021-06-11 DIAGNOSIS — I10 Essential (primary) hypertension: Secondary | ICD-10-CM | POA: Diagnosis not present

## 2021-06-11 DIAGNOSIS — C8400 Mycosis fungoides, unspecified site: Secondary | ICD-10-CM | POA: Diagnosis not present

## 2021-06-11 DIAGNOSIS — R634 Abnormal weight loss: Secondary | ICD-10-CM | POA: Diagnosis not present

## 2021-06-11 DIAGNOSIS — G4733 Obstructive sleep apnea (adult) (pediatric): Secondary | ICD-10-CM | POA: Diagnosis not present

## 2021-06-11 DIAGNOSIS — M1990 Unspecified osteoarthritis, unspecified site: Secondary | ICD-10-CM | POA: Diagnosis not present

## 2021-06-11 DIAGNOSIS — M19041 Primary osteoarthritis, right hand: Secondary | ICD-10-CM | POA: Diagnosis not present

## 2021-06-18 DIAGNOSIS — M48061 Spinal stenosis, lumbar region without neurogenic claudication: Secondary | ICD-10-CM | POA: Diagnosis not present

## 2021-07-17 DIAGNOSIS — I2510 Atherosclerotic heart disease of native coronary artery without angina pectoris: Secondary | ICD-10-CM | POA: Diagnosis not present

## 2021-07-17 DIAGNOSIS — E669 Obesity, unspecified: Secondary | ICD-10-CM | POA: Diagnosis not present

## 2021-07-17 DIAGNOSIS — I209 Angina pectoris, unspecified: Secondary | ICD-10-CM | POA: Diagnosis not present

## 2021-07-17 DIAGNOSIS — Z95820 Peripheral vascular angioplasty status with implants and grafts: Secondary | ICD-10-CM | POA: Diagnosis not present

## 2021-07-17 DIAGNOSIS — C8400 Mycosis fungoides, unspecified site: Secondary | ICD-10-CM | POA: Diagnosis not present

## 2021-07-17 DIAGNOSIS — E782 Mixed hyperlipidemia: Secondary | ICD-10-CM | POA: Diagnosis not present

## 2021-07-17 DIAGNOSIS — Z955 Presence of coronary angioplasty implant and graft: Secondary | ICD-10-CM | POA: Diagnosis not present

## 2021-07-17 DIAGNOSIS — R5383 Other fatigue: Secondary | ICD-10-CM | POA: Diagnosis not present

## 2021-07-17 DIAGNOSIS — K219 Gastro-esophageal reflux disease without esophagitis: Secondary | ICD-10-CM | POA: Diagnosis not present

## 2021-07-17 DIAGNOSIS — R0609 Other forms of dyspnea: Secondary | ICD-10-CM | POA: Diagnosis not present

## 2021-07-17 DIAGNOSIS — R001 Bradycardia, unspecified: Secondary | ICD-10-CM | POA: Diagnosis not present

## 2021-07-17 DIAGNOSIS — I10 Essential (primary) hypertension: Secondary | ICD-10-CM | POA: Diagnosis not present

## 2021-08-26 DIAGNOSIS — K219 Gastro-esophageal reflux disease without esophagitis: Secondary | ICD-10-CM | POA: Diagnosis not present

## 2021-08-26 DIAGNOSIS — R1310 Dysphagia, unspecified: Secondary | ICD-10-CM | POA: Diagnosis not present

## 2021-09-11 ENCOUNTER — Encounter: Payer: Self-pay | Admitting: Gastroenterology

## 2021-09-11 NOTE — H&P (Signed)
Pre-Procedure H&P   Patient ID: Manuel Cook is a 75 y.o. male.  Gastroenterology Provider: Annamaria Helling, DO  Referring Provider: Laurine Blazer, PA PCP: Tracie Harrier, MD  Date: 09/12/2021  HPI [name] is a 75 y.o. male who presents today for Esophagogastroduodenoscopy for dysphagia.  Pt with dysphagia for a few years which he feels is worsening. Notes dysphagia to solids and liquids- ie stew. No issues with pills. Sticking at suprasternal notch, able to pass eventually without regurgitation. GERD controlled with protonix Has h/o c spine and lumbar spine fusion.  No anemia or iron deficiency EGD in 2016- bx demonstrate signs of reflux, but no BE; EOE not commented on; dilated with 51 fr savary at that time  No fhx UGI malignancy or disease  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HCC)    Pneumonia    Sleep apnea     Past Surgical History:  Procedure Laterality Date   ANTERIOR LAT LUMBAR FUSION N/A 04/08/2021   Procedure: Lumbar one-two Lumbar two-three Anterolateral lumbar interbody fusion with posterior percutaneous fixation Lumbar one to Lumbar three, removal of old hardware at Lumbar four-five;  Surgeon: Kristeen Miss, MD;  Location: [place];  Service: Neurosurgery;  Laterality: N/A;   BACK SURGERY     CARDIAC CATHETERIZATION     COLONOSCOPY WITH PROPOFOL N/A 12/22/2016   Procedure: COLONOSCOPY WITH PROPOFOL;  Surgeon: Lollie Sails, MD;  Location: [facility] ENDOSCOPY;  Service: Endoscopy;  Laterality: N/A;   CORONARY ANGIOPLASTY WITH STENT PLACEMENT     EYE SURGERY     LUMBAR FUSION  10/28/2009   L3-5 PLIF   LUMBAR PERCUTANEOUS PEDICLE SCREW 2 LEVEL N/A 04/08/2021   Procedure: LUMBAR PERCUTANEOUS PEDICLE SCREW LUMBAR ONE TO THREE;  Surgeon: Kristeen Miss, MD;  Location: [place];  Service:  Neurosurgery;  Laterality: N/A;   Neck fusion  1995   TONSILLECTOMY      Family History No h/o GI disease or malignancy  Review of Systems  Constitutional:  Negative for activity change, appetite change, chills, diaphoresis, fatigue, fever and unexpected weight change.  HENT:  Positive for trouble swallowing. Negative for voice change.   Respiratory:  Negative for shortness of breath and wheezing.   Cardiovascular:  Negative for chest pain, palpitations and leg swelling.  Gastrointestinal:  Negative for abdominal distention, abdominal pain, anal bleeding, blood in stool, constipation, diarrhea, nausea and vomiting.  Musculoskeletal:  Negative for arthralgias and myalgias.  Skin:  Negative for color change and pallor.  Neurological:  Negative for dizziness, syncope and weakness.  Psychiatric/Behavioral:  Negative for confusion. The patient is not nervous/anxious.   All other systems reviewed and are negative.   Medications No current facility-administered medications on file prior to encounter.   Current Outpatient Medications on File Prior to Encounter  Medication Sig Dispense Refill   acetaminophen (TYLENOL) 500 MG tablet Take 1,000 mg by mouth every 6 (six) hours as needed for moderate pain or headache.     aspirin 81 MG EC tablet Take 81 mg by mouth daily.       atenolol (TENORMIN) 25 MG tablet Take by mouth daily.     atorvastatin (LIPITOR) 20 MG tablet Take 20 mg by mouth daily.     augmented  betamethasone dipropionate (DIPROLENE-AF) 0.05 % ointment Apply topically 2 (two) times daily.     clobetasol cream (TEMOVATE) 4.01 % Apply 1 application topically 2 (two) times daily.     diclofenac (VOLTAREN) 75 MG EC tablet Take 75 mg by mouth 2 (two) times daily.     ezetimibe (ZETIA) 10 MG tablet Take 10 mg by mouth daily.     gabapentin (NEURONTIN) 100 MG capsule Take 100 mg by mouth 3 (three) times daily.     Melatonin 1 MG CAPS Take 1 mg by mouth at bedtime as needed (sleep).      methocarbamol (ROBAXIN) 500 MG tablet Take 1 tablet (500 mg total) by mouth every 6 (six) hours as needed for muscle spasms. 30 tablet 3   misoprostol (CYTOTEC) 200 MCG tablet Take 200 mcg by mouth 2 (two) times daily.     oxyCODONE-acetaminophen (PERCOCET/ROXICET) 5-325 MG tablet Take 1-2 tablets by mouth every 4 (four) hours as needed for moderate pain or severe pain. 60 tablet 0   pantoprazole (PROTONIX) 40 MG tablet Take 40 mg by mouth daily as needed (acid reflux).     triamcinolone ointment (KENALOG) 0.1 % Apply 1 application topically 2 (two) times daily.      Pertinent medications related to GI and procedure were reviewed by me with the patient prior to the procedure   Current Facility-Administered Medications:    0.9 %  sodium chloride infusion, , Intravenous, Continuous, Annamaria Helling, DO, Last Rate: 20 mL/hr at 09/12/21 0934, 20 mL/hr at 09/12/21 0272      Allergies  Allergen Reactions   Sulfa Antibiotics     Unknown reaction   Allergies were reviewed by me prior to the procedure  Objective    Vitals:   09/12/21 0923  BP: (!) 157/64  Pulse: (!) 51  Resp: 20  Temp: (!) 96.6 F (35.9 C)  TempSrc: Temporal  SpO2: 99%  Weight: 90.7 kg  Height: 5\' 7"  (1.702 m)     Physical Exam Vitals and nursing note reviewed.  Constitutional:      General: He is not in acute distress.    Appearance: Normal appearance. He is not ill-appearing, toxic-appearing or diaphoretic.  HENT:     Head: Normocephalic and atraumatic.     Nose: Nose normal.     Mouth/Throat:     Mouth: Mucous membranes are moist.     Pharynx: Oropharynx is clear.  Eyes:     General: No scleral icterus.    Extraocular Movements: Extraocular movements intact.  Cardiovascular:     Rate and Rhythm: Regular rhythm. Bradycardia present.     Heart sounds: Normal heart sounds. No murmur heard.   No friction rub. No gallop.  Pulmonary:     Effort: Pulmonary effort is normal. No respiratory distress.      Breath sounds: Normal breath sounds. No wheezing, rhonchi or rales.  Abdominal:     General: Bowel sounds are normal. There is no distension.     Palpations: Abdomen is soft.     Tenderness: There is no abdominal tenderness. There is no guarding or rebound.  Musculoskeletal:     Cervical back: Neck supple.     Right lower leg: No edema.     Left lower leg: No edema.  Skin:    General: Skin is warm and dry.     Coloration: Skin is not jaundiced or pale.  Neurological:     General: No focal deficit present.     Mental Status:  He is alert and oriented to person, place, and time. Mental status is at baseline.  Psychiatric:        Mood and Affect: Mood normal.        Behavior: Behavior normal.        Thought Content: Thought content normal.        Judgment: Judgment normal.     Assessment:  [name] is a 75 y.o. male  who presents today for Esophagogastroduodenoscopy for dysphagia.  Plan:  Esophagogastroduodenoscopy with possible intervention today  Esophagogastroduodenoscopy with possible biopsy, control of bleeding, polypectomy, and interventions as necessary has been discussed with the patient/patient representative. Informed consent was obtained from the patient/patient representative after explaining the indication, nature, and risks of the procedure including but not limited to death, bleeding, perforation, missed neoplasm/lesions, cardiorespiratory compromise, and reaction to medications. Opportunity for questions was given and appropriate answers were provided. Patient/patient representative has verbalized understanding is amenable to undergoing the procedure.   Annamaria Helling, DO  [facility] Gastroenterology  Portions of the record may have been created with voice recognition software. Occasional wrong-word or 'sound-a-like' substitutions may have occurred due to the inherent limitations of voice recognition software.  Read the chart carefully and  recognize, using context, where substitutions may have occurred.

## 2021-09-12 ENCOUNTER — Encounter
Admission: RE | Disposition: A | Discharge status: Home / Self Care | Payer: Self-pay | Source: Home / Self Care | Attending: Gastroenterology

## 2021-09-12 ENCOUNTER — Ambulatory Visit: Payer: PPO | Admitting: Anesthesiology

## 2021-09-12 ENCOUNTER — Encounter: Payer: Self-pay | Admitting: Gastroenterology

## 2021-09-12 ENCOUNTER — Ambulatory Visit
Admission: RE | Admit: 2021-09-12 | Discharge: 2021-09-12 | Disposition: A | Discharge status: Home / Self Care | Payer: PPO | Attending: Gastroenterology | Admitting: Gastroenterology

## 2021-09-12 DIAGNOSIS — I10 Essential (primary) hypertension: Secondary | ICD-10-CM | POA: Diagnosis not present

## 2021-09-12 DIAGNOSIS — R1310 Dysphagia, unspecified: Secondary | ICD-10-CM | POA: Insufficient documentation

## 2021-09-12 DIAGNOSIS — I2510 Atherosclerotic heart disease of native coronary artery without angina pectoris: Secondary | ICD-10-CM | POA: Insufficient documentation

## 2021-09-12 DIAGNOSIS — Z981 Arthrodesis status: Secondary | ICD-10-CM | POA: Insufficient documentation

## 2021-09-12 DIAGNOSIS — G4730 Sleep apnea, unspecified: Secondary | ICD-10-CM | POA: Insufficient documentation

## 2021-09-12 DIAGNOSIS — E7800 Pure hypercholesterolemia, unspecified: Secondary | ICD-10-CM | POA: Diagnosis not present

## 2021-09-12 DIAGNOSIS — K449 Diaphragmatic hernia without obstruction or gangrene: Secondary | ICD-10-CM | POA: Diagnosis not present

## 2021-09-12 DIAGNOSIS — Z87891 Personal history of nicotine dependence: Secondary | ICD-10-CM | POA: Insufficient documentation

## 2021-09-12 DIAGNOSIS — K219 Gastro-esophageal reflux disease without esophagitis: Secondary | ICD-10-CM | POA: Insufficient documentation

## 2021-09-12 HISTORY — PX: ESOPHAGOGASTRODUODENOSCOPY: SHX5428

## 2021-09-12 SURGERY — EGD (ESOPHAGOGASTRODUODENOSCOPY)
Anesthesia: General

## 2021-09-12 MED ORDER — PROPOFOL 10 MG/ML IV BOLUS
INTRAVENOUS | Status: DC | PRN
  Administered 2021-09-12: 60 mg via INTRAVENOUS

## 2021-09-12 MED ORDER — SODIUM CHLORIDE 0.9 % IV SOLN
INTRAVENOUS | Status: DC
  Administered 2021-09-12: 20 mL/h via INTRAVENOUS

## 2021-09-12 MED ORDER — PROPOFOL 500 MG/50ML IV EMUL
INTRAVENOUS | Status: DC | PRN
  Administered 2021-09-12: 200 ug/kg/min via INTRAVENOUS

## 2021-09-12 NOTE — Interval H&P Note (Signed)
History and Physical Interval Note: Preprocedure H&P from 09/12/21  was reviewed and there was no interval change after seeing and examining the patient.  Written consent was obtained from the patient after discussion of risks, benefits, and alternatives. Patient has consented to proceed with Esophagogastroduodenoscopy with possible intervention   09/12/2021 11:27 AM  Manuel Cook  has presented today for surgery, with the diagnosis of Dysphagia, unspecified type (R13.10) Gastroesophageal reflux disease, unspecified whether esophagitis present (K21.9).  The various methods of treatment have been discussed with the patient and family. After consideration of risks, benefits and other options for treatment, the patient has consented to  Procedure(s): ESOPHAGOGASTRODUODENOSCOPY (EGD) (N/A) as a surgical intervention.  The patient's history has been reviewed, patient examined, no change in status, stable for surgery.  I have reviewed the patient's chart and labs.  Questions were answered to the patient's satisfaction.     Annamaria Helling

## 2021-09-12 NOTE — Anesthesia Preprocedure Evaluation (Signed)
Anesthesia Evaluation  Patient identified by MRN, date of birth, ID band Patient awake    Reviewed: Allergy & Precautions, NPO status , Patient's Chart, lab work & pertinent test results  Airway Mallampati: II  TM Distance: >3 FB Neck ROM: full    Dental  (+) Upper Dentures   Pulmonary neg pulmonary ROS, sleep apnea , former smoker,    Pulmonary exam normal  + decreased breath sounds      Cardiovascular Exercise Tolerance: Good hypertension, + CAD  negative cardio ROS Normal cardiovascular exam Rhythm:Regular Rate:Normal     Neuro/Psych Anxiety negative neurological ROS  negative psych ROS   GI/Hepatic negative GI ROS, Neg liver ROS, GERD  ,  Endo/Other  negative endocrine ROS  Renal/GU negative Renal ROS  negative genitourinary   Musculoskeletal  (+) Arthritis ,   Abdominal Normal abdominal exam  (+)   Peds negative pediatric ROS (+)  Hematology negative hematology ROS (+)   Anesthesia Other Findings Past Medical History: No date: Acid reflux No date: Anxiety No date: Arthritis No date: CAD (coronary artery disease)     Comment:  stents No date: Cancer ([place]) No date: High cholesterol No date: Hypertension No date: Mycosis fungoides (HCC) No date: Pneumonia No date: Sleep apnea  Past Surgical History: 04/08/2021: ANTERIOR LAT LUMBAR FUSION; N/A     Comment:  Procedure: Lumbar one-two Lumbar two-three Anterolateral              lumbar interbody fusion with posterior percutaneous               fixation Lumbar one to Lumbar three, removal of old               hardware at Lumbar four-five;  Surgeon: Kristeen Miss,               MD;  Location: [place];  Service: Neurosurgery;                Laterality: N/A; No date: BACK SURGERY No date: CARDIAC CATHETERIZATION 12/22/2016: COLONOSCOPY WITH PROPOFOL; N/A     Comment:  Procedure: COLONOSCOPY WITH PROPOFOL;  Surgeon: Lollie Sails, MD;   Location: [facility] ENDOSCOPY;  Service:               Endoscopy;  Laterality: N/A; No date: CORONARY ANGIOPLASTY WITH STENT PLACEMENT No date: EYE SURGERY 10/28/2009: LUMBAR FUSION     Comment:  L3-5 PLIF 04/08/2021: LUMBAR PERCUTANEOUS PEDICLE SCREW 2 LEVEL; N/A     Comment:  Procedure: LUMBAR PERCUTANEOUS PEDICLE SCREW LUMBAR ONE               TO THREE;  Surgeon: Kristeen Miss, MD;  Location: [place];               Service: Neurosurgery;  Laterality: N/A; 1995: Neck fusion No date: TONSILLECTOMY  BMI    Body Mass Index: 31.32 kg/m      Reproductive/Obstetrics negative OB ROS                             Anesthesia Physical Anesthesia Plan  ASA: 3  Anesthesia Plan: General   Post-op Pain Management:    Induction: Intravenous  PONV Risk Score and Plan: Propofol infusion and TIVA  Airway Management Planned: Natural Airway and Nasal Cannula  Additional Equipment:   Intra-op Plan:   Post-operative Plan:  Informed Consent: I have reviewed the patients History and Physical, chart, labs and discussed the procedure including the risks, benefits and alternatives for the proposed anesthesia with the patient or authorized representative who has indicated his/her understanding and acceptance.     Dental Advisory Given  Plan Discussed with: Anesthesiologist, CRNA and Surgeon  Anesthesia Plan Comments:         Anesthesia Quick Evaluation

## 2021-09-12 NOTE — Op Note (Signed)
[facility] Gastroenterology Patient Name: Manuel Cook Procedure Date: 09/12/2021 11:11 AM MRN: [id number] Account #: [id number] Date of Birth: 10/08/1945 Admit Type: Outpatient Age: 75 Room: [facility] ENDO ROOM 1 Gender: Male Note Status: Finalized Instrument Name: Michaelle Birks [id number] Procedure:             Upper GI endoscopy Indications:           Dysphagia Providers:             Annamaria Helling DO, DO Medicines:             Monitored Anesthesia Care Complications:         No immediate complications. Estimated blood loss:                         Minimal. Procedure:             Pre-Anesthesia Assessment:                        - Prior to the procedure, a History and Physical was                         performed, and patient medications and allergies were                         reviewed. The patient is competent. The risks and                         benefits of the procedure and the sedation options and                         risks were discussed with the patient. All questions                         were answered and informed consent was obtained.                         Patient identification and proposed procedure were                         verified by the physician, the nurse, the anesthetist                         and the technician in the endoscopy suite. Mental                         Status Examination: alert and oriented. Airway                         Examination: normal oropharyngeal airway and neck                         mobility. Respiratory Examination: clear to                         auscultation. CV Examination: RRR, no murmurs, no S3                         or S4. Prophylactic Antibiotics: The patient does not  require prophylactic antibiotics. Prior                         Anticoagulants: The patient has taken no previous                         anticoagulant or antiplatelet agents. ASA Grade                          Assessment: III - A patient with severe systemic                         disease. After reviewing the risks and benefits, the                         patient was deemed in satisfactory condition to                         undergo the procedure. The anesthesia plan was to use                         monitored anesthesia care (MAC). Immediately prior to                         administration of medications, the patient was                         re-assessed for adequacy to receive sedatives. The                         heart rate, respiratory rate, oxygen saturations,                         blood pressure, adequacy of pulmonary ventilation, and                         response to care were monitored throughout the                         procedure. The physical status of the patient was                         re-assessed after the procedure.                        After obtaining informed consent, the endoscope was                         passed under direct vision. Throughout the procedure,                         the patient's blood pressure, pulse, and oxygen                         saturations were monitored continuously. The Endoscope                         was introduced through the mouth, and advanced to the  second part of duodenum. The upper GI endoscopy was                         accomplished without difficulty. The patient tolerated                         the procedure well. Findings:      The duodenal bulb, first portion of the duodenum and second portion of       the duodenum were normal. Estimated blood loss: none.      A small hiatal hernia was present. Estimated blood loss: none.      Normal mucosa was found in the entire examined stomach. Estimated blood       loss: none.      The Z-line was regular. Estimated blood loss: none.      Esophagogastric landmarks were identified: the gastroesophageal junction       was found at 40 cm from the  incisors.      Normal mucosa was found in the entire esophagus. Biopsies were taken       with a cold forceps for histology. Estimated blood loss was minimal.      No endoscopic abnormality was evident in the esophagus to explain the       patient's complaint of dysphagia. It was decided, however, to proceed       with dilation of the entire esophagus. The scope was withdrawn. Dilation       was performed with a Maloney dilator with no resistance at 52 Fr. The       dilation site was examined following endoscope reinsertion and showed no       change. Estimated blood loss: none.      The exam was otherwise without abnormality. Impression:            - Normal duodenal bulb, first portion of the duodenum                         and second portion of the duodenum.                        - Small hiatal hernia.                        - Normal mucosa was found in the entire stomach.                        - Z-line regular.                        - Esophagogastric landmarks identified.                        - Normal mucosa was found in the entire esophagus.                         Biopsied.                        - No endoscopic esophageal abnormality to explain                         patient's dysphagia. Esophagus dilated. Dilated.                        -  The examination was otherwise normal. Recommendation:        - Discharge patient to home.                        - Resume previous diet.                        - Continue present medications.                        - Await pathology results.                        - Return to GI clinic as previously scheduled. Procedure Code(s):     --- Professional ---                        [phone], Esophagogastroduodenoscopy, flexible,                         transoral; with biopsy, single or multiple                        43450, Dilation of esophagus, by unguided sound or                         bougie, single or multiple passes Diagnosis Code(s):      --- Professional ---                        K44.9, Diaphragmatic hernia without obstruction or                         gangrene                        R13.10, Dysphagia, unspecified CPT copyright 2019 American Medical Association. All rights reserved. The codes documented in this report are preliminary and upon coder review may  be revised to meet current compliance requirements. Attending Participation:      I personally performed the entire procedure. Volney American, DO Annamaria Helling DO, DO 09/12/2021 11:46:56 AM This report has been signed electronically. Number of Addenda: 0 Note Initiated On: 09/12/2021 11:11 AM Estimated Blood Loss:  Estimated blood loss was minimal.      [facility]

## 2021-09-12 NOTE — Anesthesia Postprocedure Evaluation (Signed)
Anesthesia Post Note  Patient: Manuel Cook  Procedure(s) Performed: ESOPHAGOGASTRODUODENOSCOPY (EGD)  Patient location during evaluation: PACU Anesthesia Type: General Level of consciousness: awake and awake and alert Pain management: pain level controlled Vital Signs Assessment: post-procedure vital signs reviewed and stable Respiratory status: spontaneous breathing and respiratory function stable Cardiovascular status: stable Anesthetic complications: no   No notable events documented.   Last Vitals:  Vitals:   09/12/21 1155 09/12/21 1213  BP: (!) 153/52 (!) 159/62  Pulse:    Resp:    Temp:    SpO2:      Last Pain:  Vitals:   09/12/21 1213  TempSrc:   PainSc: 0-No pain                 VAN STAVEREN,Nakeya Adinolfi

## 2021-09-12 NOTE — Transfer of Care (Signed)
Immediate Anesthesia Transfer of Care Note  Patient: Manuel Cook  Procedure(s) Performed: ESOPHAGOGASTRODUODENOSCOPY (EGD)  Patient Location: PACU  Anesthesia Type:General  Level of Consciousness: sedated  Airway & Oxygen Therapy: Patient Spontanous Breathing and Patient connected to nasal cannula oxygen  Post-op Assessment: Report given to RN and Post -op Vital signs reviewed and stable  Post vital signs: Reviewed and stable  Last Vitals:  Vitals Value Taken Time  BP 123/48 09/12/21 1145  Temp 36.1 C 09/12/21 1145  Pulse 56 09/12/21 1146  Resp 18 09/12/21 1146  SpO2 94 % 09/12/21 1146  Vitals shown include unvalidated device data.  Last Pain:  Vitals:   09/12/21 1145  TempSrc: Temporal  PainSc:          Complications: No notable events documented.

## 2021-09-15 ENCOUNTER — Encounter: Payer: Self-pay | Admitting: Gastroenterology

## 2021-09-15 LAB — SURGICAL PATHOLOGY

## 2021-09-17 DIAGNOSIS — Z6829 Body mass index (BMI) 29.0-29.9, adult: Secondary | ICD-10-CM | POA: Diagnosis not present

## 2021-09-17 DIAGNOSIS — M48061 Spinal stenosis, lumbar region without neurogenic claudication: Secondary | ICD-10-CM | POA: Diagnosis not present

## 2021-09-17 DIAGNOSIS — I10 Essential (primary) hypertension: Secondary | ICD-10-CM | POA: Diagnosis not present

## 2021-10-07 DIAGNOSIS — G4733 Obstructive sleep apnea (adult) (pediatric): Secondary | ICD-10-CM | POA: Diagnosis not present

## 2021-10-07 DIAGNOSIS — Z7982 Long term (current) use of aspirin: Secondary | ICD-10-CM | POA: Diagnosis not present

## 2021-10-07 DIAGNOSIS — I10 Essential (primary) hypertension: Secondary | ICD-10-CM | POA: Diagnosis not present

## 2021-10-07 DIAGNOSIS — H9193 Unspecified hearing loss, bilateral: Secondary | ICD-10-CM | POA: Diagnosis not present

## 2021-10-07 DIAGNOSIS — K219 Gastro-esophageal reflux disease without esophagitis: Secondary | ICD-10-CM | POA: Diagnosis not present

## 2021-10-07 DIAGNOSIS — E785 Hyperlipidemia, unspecified: Secondary | ICD-10-CM | POA: Diagnosis not present

## 2021-11-04 DIAGNOSIS — I2510 Atherosclerotic heart disease of native coronary artery without angina pectoris: Secondary | ICD-10-CM | POA: Diagnosis not present

## 2021-11-04 DIAGNOSIS — I10 Essential (primary) hypertension: Secondary | ICD-10-CM | POA: Diagnosis not present

## 2021-11-04 DIAGNOSIS — E7849 Other hyperlipidemia: Secondary | ICD-10-CM | POA: Diagnosis not present

## 2021-11-04 DIAGNOSIS — G4733 Obstructive sleep apnea (adult) (pediatric): Secondary | ICD-10-CM | POA: Diagnosis not present

## 2021-11-04 DIAGNOSIS — C8400 Mycosis fungoides, unspecified site: Secondary | ICD-10-CM | POA: Diagnosis not present

## 2021-11-04 DIAGNOSIS — Z955 Presence of coronary angioplasty implant and graft: Secondary | ICD-10-CM | POA: Diagnosis not present

## 2021-11-04 DIAGNOSIS — Z981 Arthrodesis status: Secondary | ICD-10-CM | POA: Diagnosis not present

## 2021-11-11 DIAGNOSIS — M19042 Primary osteoarthritis, left hand: Secondary | ICD-10-CM | POA: Diagnosis not present

## 2021-11-11 DIAGNOSIS — I10 Essential (primary) hypertension: Secondary | ICD-10-CM | POA: Diagnosis not present

## 2021-11-11 DIAGNOSIS — C8400 Mycosis fungoides, unspecified site: Secondary | ICD-10-CM | POA: Diagnosis not present

## 2021-11-11 DIAGNOSIS — Z981 Arthrodesis status: Secondary | ICD-10-CM | POA: Diagnosis not present

## 2021-11-11 DIAGNOSIS — Z1331 Encounter for screening for depression: Secondary | ICD-10-CM | POA: Diagnosis not present

## 2021-11-11 DIAGNOSIS — Z955 Presence of coronary angioplasty implant and graft: Secondary | ICD-10-CM | POA: Diagnosis not present

## 2021-11-11 DIAGNOSIS — E782 Mixed hyperlipidemia: Secondary | ICD-10-CM | POA: Diagnosis not present

## 2021-11-11 DIAGNOSIS — M19041 Primary osteoarthritis, right hand: Secondary | ICD-10-CM | POA: Diagnosis not present

## 2021-11-11 DIAGNOSIS — G4733 Obstructive sleep apnea (adult) (pediatric): Secondary | ICD-10-CM | POA: Diagnosis not present

## 2021-11-11 DIAGNOSIS — Z0000 Encounter for general adult medical examination without abnormal findings: Secondary | ICD-10-CM | POA: Diagnosis not present

## 2021-11-12 DIAGNOSIS — M5136 Other intervertebral disc degeneration, lumbar region: Secondary | ICD-10-CM | POA: Diagnosis not present

## 2021-11-12 DIAGNOSIS — M9903 Segmental and somatic dysfunction of lumbar region: Secondary | ICD-10-CM | POA: Diagnosis not present

## 2021-11-12 DIAGNOSIS — M9905 Segmental and somatic dysfunction of pelvic region: Secondary | ICD-10-CM | POA: Diagnosis not present

## 2021-11-12 DIAGNOSIS — M955 Acquired deformity of pelvis: Secondary | ICD-10-CM | POA: Diagnosis not present

## 2022-01-15 DIAGNOSIS — R0609 Other forms of dyspnea: Secondary | ICD-10-CM | POA: Diagnosis not present

## 2022-01-15 DIAGNOSIS — Z955 Presence of coronary angioplasty implant and graft: Secondary | ICD-10-CM | POA: Diagnosis not present

## 2022-01-15 DIAGNOSIS — K219 Gastro-esophageal reflux disease without esophagitis: Secondary | ICD-10-CM | POA: Diagnosis not present

## 2022-01-15 DIAGNOSIS — I10 Essential (primary) hypertension: Secondary | ICD-10-CM | POA: Diagnosis not present

## 2022-01-15 DIAGNOSIS — I2510 Atherosclerotic heart disease of native coronary artery without angina pectoris: Secondary | ICD-10-CM | POA: Diagnosis not present

## 2022-01-15 DIAGNOSIS — R5383 Other fatigue: Secondary | ICD-10-CM | POA: Diagnosis not present

## 2022-01-15 DIAGNOSIS — Z95820 Peripheral vascular angioplasty status with implants and grafts: Secondary | ICD-10-CM | POA: Diagnosis not present

## 2022-01-15 DIAGNOSIS — E782 Mixed hyperlipidemia: Secondary | ICD-10-CM | POA: Diagnosis not present

## 2022-01-15 DIAGNOSIS — R001 Bradycardia, unspecified: Secondary | ICD-10-CM | POA: Diagnosis not present

## 2022-01-15 DIAGNOSIS — E669 Obesity, unspecified: Secondary | ICD-10-CM | POA: Diagnosis not present

## 2022-01-15 DIAGNOSIS — I209 Angina pectoris, unspecified: Secondary | ICD-10-CM | POA: Diagnosis not present

## 2022-02-03 DIAGNOSIS — M9905 Segmental and somatic dysfunction of pelvic region: Secondary | ICD-10-CM | POA: Diagnosis not present

## 2022-02-03 DIAGNOSIS — M955 Acquired deformity of pelvis: Secondary | ICD-10-CM | POA: Diagnosis not present

## 2022-02-03 DIAGNOSIS — M5136 Other intervertebral disc degeneration, lumbar region: Secondary | ICD-10-CM | POA: Diagnosis not present

## 2022-02-03 DIAGNOSIS — M9903 Segmental and somatic dysfunction of lumbar region: Secondary | ICD-10-CM | POA: Diagnosis not present

## 2022-03-03 DIAGNOSIS — M955 Acquired deformity of pelvis: Secondary | ICD-10-CM | POA: Diagnosis not present

## 2022-03-03 DIAGNOSIS — M5136 Other intervertebral disc degeneration, lumbar region: Secondary | ICD-10-CM | POA: Diagnosis not present

## 2022-03-03 DIAGNOSIS — M9905 Segmental and somatic dysfunction of pelvic region: Secondary | ICD-10-CM | POA: Diagnosis not present

## 2022-03-03 DIAGNOSIS — M9903 Segmental and somatic dysfunction of lumbar region: Secondary | ICD-10-CM | POA: Diagnosis not present

## 2022-03-30 DIAGNOSIS — M5136 Other intervertebral disc degeneration, lumbar region: Secondary | ICD-10-CM | POA: Diagnosis not present

## 2022-03-30 DIAGNOSIS — M955 Acquired deformity of pelvis: Secondary | ICD-10-CM | POA: Diagnosis not present

## 2022-03-30 DIAGNOSIS — M9905 Segmental and somatic dysfunction of pelvic region: Secondary | ICD-10-CM | POA: Diagnosis not present

## 2022-03-30 DIAGNOSIS — M9903 Segmental and somatic dysfunction of lumbar region: Secondary | ICD-10-CM | POA: Diagnosis not present

## 2022-04-28 DIAGNOSIS — M9903 Segmental and somatic dysfunction of lumbar region: Secondary | ICD-10-CM | POA: Diagnosis not present

## 2022-04-28 DIAGNOSIS — M955 Acquired deformity of pelvis: Secondary | ICD-10-CM | POA: Diagnosis not present

## 2022-04-28 DIAGNOSIS — M9905 Segmental and somatic dysfunction of pelvic region: Secondary | ICD-10-CM | POA: Diagnosis not present

## 2022-04-28 DIAGNOSIS — M5136 Other intervertebral disc degeneration, lumbar region: Secondary | ICD-10-CM | POA: Diagnosis not present

## 2022-05-05 DIAGNOSIS — Z125 Encounter for screening for malignant neoplasm of prostate: Secondary | ICD-10-CM | POA: Diagnosis not present

## 2022-05-05 DIAGNOSIS — G4733 Obstructive sleep apnea (adult) (pediatric): Secondary | ICD-10-CM | POA: Diagnosis not present

## 2022-05-05 DIAGNOSIS — I10 Essential (primary) hypertension: Secondary | ICD-10-CM | POA: Diagnosis not present

## 2022-05-05 DIAGNOSIS — E782 Mixed hyperlipidemia: Secondary | ICD-10-CM | POA: Diagnosis not present

## 2022-05-05 DIAGNOSIS — Z0000 Encounter for general adult medical examination without abnormal findings: Secondary | ICD-10-CM | POA: Diagnosis not present

## 2022-05-11 DIAGNOSIS — M19041 Primary osteoarthritis, right hand: Secondary | ICD-10-CM | POA: Diagnosis not present

## 2022-05-11 DIAGNOSIS — M19042 Primary osteoarthritis, left hand: Secondary | ICD-10-CM | POA: Diagnosis not present

## 2022-05-11 DIAGNOSIS — I10 Essential (primary) hypertension: Secondary | ICD-10-CM | POA: Diagnosis not present

## 2022-05-11 DIAGNOSIS — C8400 Mycosis fungoides, unspecified site: Secondary | ICD-10-CM | POA: Diagnosis not present

## 2022-05-11 DIAGNOSIS — Z6829 Body mass index (BMI) 29.0-29.9, adult: Secondary | ICD-10-CM | POA: Diagnosis not present

## 2022-05-11 DIAGNOSIS — R0789 Other chest pain: Secondary | ICD-10-CM | POA: Diagnosis not present

## 2022-05-11 DIAGNOSIS — Z981 Arthrodesis status: Secondary | ICD-10-CM | POA: Diagnosis not present

## 2022-05-11 DIAGNOSIS — I2510 Atherosclerotic heart disease of native coronary artery without angina pectoris: Secondary | ICD-10-CM | POA: Diagnosis not present

## 2022-05-11 DIAGNOSIS — G4733 Obstructive sleep apnea (adult) (pediatric): Secondary | ICD-10-CM | POA: Diagnosis not present

## 2022-05-11 DIAGNOSIS — E7800 Pure hypercholesterolemia, unspecified: Secondary | ICD-10-CM | POA: Diagnosis not present

## 2022-05-11 DIAGNOSIS — R918 Other nonspecific abnormal finding of lung field: Secondary | ICD-10-CM | POA: Diagnosis not present

## 2022-05-12 ENCOUNTER — Other Ambulatory Visit: Payer: Self-pay | Admitting: Internal Medicine

## 2022-05-12 DIAGNOSIS — R918 Other nonspecific abnormal finding of lung field: Secondary | ICD-10-CM

## 2022-05-12 DIAGNOSIS — R0789 Other chest pain: Secondary | ICD-10-CM

## 2022-05-20 ENCOUNTER — Inpatient Hospital Stay: Admission: RE | Admit: 2022-05-20 | Payer: PPO | Source: Ambulatory Visit

## 2022-05-21 ENCOUNTER — Ambulatory Visit
Admission: RE | Admit: 2022-05-21 | Discharge: 2022-05-21 | Disposition: A | Discharge status: Home / Self Care | Payer: PPO | Source: Ambulatory Visit | Attending: Internal Medicine | Admitting: Internal Medicine

## 2022-05-21 DIAGNOSIS — K8020 Calculus of gallbladder without cholecystitis without obstruction: Secondary | ICD-10-CM | POA: Diagnosis not present

## 2022-05-21 DIAGNOSIS — R0789 Other chest pain: Secondary | ICD-10-CM

## 2022-05-21 DIAGNOSIS — I700 Atherosclerosis of aorta: Secondary | ICD-10-CM | POA: Diagnosis not present

## 2022-05-21 DIAGNOSIS — I2510 Atherosclerotic heart disease of native coronary artery without angina pectoris: Secondary | ICD-10-CM | POA: Diagnosis not present

## 2022-05-21 DIAGNOSIS — R918 Other nonspecific abnormal finding of lung field: Secondary | ICD-10-CM

## 2022-05-21 MED ORDER — IOPAMIDOL (ISOVUE-300) INJECTION 61%
75.0000 mL | Freq: Once | INTRAVENOUS | Status: AC | PRN
  Administered 2022-05-21: 75 mL via INTRAVENOUS

## 2022-05-25 DIAGNOSIS — M9905 Segmental and somatic dysfunction of pelvic region: Secondary | ICD-10-CM | POA: Diagnosis not present

## 2022-05-25 DIAGNOSIS — M955 Acquired deformity of pelvis: Secondary | ICD-10-CM | POA: Diagnosis not present

## 2022-05-25 DIAGNOSIS — M5136 Other intervertebral disc degeneration, lumbar region: Secondary | ICD-10-CM | POA: Diagnosis not present

## 2022-05-25 DIAGNOSIS — M9903 Segmental and somatic dysfunction of lumbar region: Secondary | ICD-10-CM | POA: Diagnosis not present

## 2022-06-18 ENCOUNTER — Ambulatory Visit: Payer: Self-pay

## 2022-06-18 ENCOUNTER — Telehealth: Payer: Self-pay

## 2022-06-18 NOTE — Patient Outreach (Signed)
  Care Coordination   06/18/2022 Name: Manuel Cook MRN: [id number] DOB: Oct 17, 1945   Care Coordination Outreach Attempts:  An unsuccessful telephone outreach was attempted today to offer the patient information about available care coordination services as a benefit of their health plan.   Follow Up Plan:  Additional outreach attempts will be made to offer the patient care coordination information and services.   Encounter Outcome:  No Answer  Care Coordination Interventions Activated:  No   Care Coordination Interventions:  No, not indicated    Noreene Larsson RN, MSN, [facility]  Mobile: [phone]

## 2022-06-18 NOTE — Patient Instructions (Signed)
Visit Information  Thank you for taking time to visit with me today. Please don't hesitate to contact me if I can be of assistance to you.   Following are the goals we discussed today:   Goals Addressed             This Visit's Progress    RNCM: Swallowing difficulties       Care Coordination Interventions: Evaluation of current treatment plan related to swallowing difficulty and the patient having his esophagus stretched before and patient's adherence to plan as established by provider Advised patient to have the patient call the [facility] for assessment and review of the patients signs and symptoms.  The patients wife states that he is getting "choked" when eating and she is wanting him to follow up with the provider. Provided education to patient re: dysphagia and will attach educational information to the AVS for review and education Discussed plans with patient for ongoing care management follow up and provided patient with direct contact information for care management team Advised patient to discuss changes in his swallowing  with provider Screening for signs and symptoms of depression related to chronic disease state  Assessed social determinant of health barriers Spoke to the patients wife and the wife will talk to the patient and ask the patient to call the [facility] for review and education. Review of the goals of the care coordination program and resources of the nurse, SW and pharmacist. The wife has the [facility] number and also the [facility] provided information of leaving a message on the patients voicemail earlier.              Please call the care guide team at [phone] if you need to  schedule an appointment.   If you are experiencing a Mental Health or [place] or need someone to talk to, please call the Suicide and Crisis Lifeline: 988 call the [place] National Suicide Prevention Lifeline: 8016125356 or TTY: 210-103-6904 TTY 907-275-2113) to talk to a trained  counselor call 1-800-273-TALK (toll free, 24 hour hotline)  Patient verbalizes understanding of instructions and care plan provided today and agrees to view in [place]. Active MyChart status and patient understanding of how to access instructions and care plan via MyChart confirmed with patient.     The patients wife is going to talk with the patient and have the patient reach out to the [facility].  Dysphagia Eating Plan, Bite Size Food This diet is recommended for people who are not able to bite pieces of food but are able to chew. You may need this diet if you have weakness of the muscles that control swallowing, you have an increased risk of choking, or you suffer from fatigue when chewing. Foods in the diet are soft, tender, and moist. Work with your health care provider, your diet and nutrition specialist (dietitian), or speech-language pathologist to make sure you are following the eating plan safely and getting all the nutrients you need. What are tips for following this plan? Cooking To moisten foods, add liquids while you are blending, mashing, or grinding your foods to the right consistency. These liquids include gravies, sauces, vegetable or fruit juice, milk, half and half, or water. Strain extra liquid from foods before eating. Reheat foods slowly to prevent a tough crust from forming. Prepare foods in advance. Meal planning Eat a variety of foods to get all the nutrients you need. Some foods may be tolerated better than others. Work with your Copywriter, advertising to identify  which foods are safest for you to eat. Follow your meal plan as told by your dietitian. General information You may eat foods that are tender, soft, and moist. Always test food texture before taking a bite. Poke food with a fork or spoon to make sure it is tender. The test sample should squash, break apart, or change shape, and it should not return to its original shape when the fork or spoon is  removed. Food should be easy to cut and chew. Avoid large pieces of food that require a lot of chewing. Take small bites. Each bite should be smaller than your thumbnail (about 15 mm by 15 mm for adults and 86m by 8 mm for children). If you were on a pureed or minced food eating plan, you may eat any of the foods included in those diets. Avoid foods that are very dry, hard, sticky, chewy, coarse, or crunchy. If instructed by your health care provider, thicken liquids. Follow your health care provider's instructions for what products to use, how to do this, and to what thickness. What foods should I eat?        Fruits Canned or cooked fruits that are soft or moist and do not have skin or seeds. Fresh, soft bananas. Vegetables Soft, well-cooked vegetables in small pieces. Soft-cooked, mashed potatoes. Grains Moist breads without nuts or seeds. Biscuits, muffins, pancakes, and waffles that are well-moistened with syrup, jelly, margarine, or butter. Cooked cereals. Moist bread stuffing. Moist rice. Well-moistened cold cereal with small chunks. Well-cooked pasta, noodles, and rice in small pieces and thick sauce. Soft dumplings or spaetzle in small pieces and butter or gravy. Meats and other proteins Tender, moist meats or poultry in small pieces. Moist meatballs or meatloaf. Fish without bones. Eggs or egg substitutes in small pieces. Tofu. Tempeh and meat alternatives in small pieces. Well-cooked, tender beans, peas, baked beans, and other legumes. Dairy Milk. Cream cheese. Yogurt. Cottage cheese. Sour cream. Small pieces of soft cheese. Fats and oils Butter. Oils. Margarine. Mayonnaise. Gravy. Spreads. Sweets and desserts Soft, smooth, moist desserts. Pudding. Custard. Moist cakes. Jam. Jelly. Honey. Preserves. Ask your health care provider whether you can have frozen desserts. Seasonings and other foods All seasonings and sweeteners. All sauces with small chunks. Prepared tuna, egg, or  chicken salad without raw fruits or vegetables. Moist casseroles with small, tender pieces of meat. Soups with tender meat. The items listed above may not be a complete list of foods and beverages you can eat. Contact a dietitian for more information. What foods should I avoid? Fruits Hard, crunchy, stringy, high-pulp, and juicy raw fruits such as apples, pineapple, papaya, and watermelon. Small, round fruits, such as grapes. Dried fruit and fruit leather. Vegetables All raw vegetables. Cooked corn. Rubbery or stiff cooked vegetables. Stringy vegetables, such as celery. Tough, crisp fried potatoes. Potato skins. Grains Coarse or dry cereals. Dry breads. Toast. Crackers. Tough, crusty breads, such as [place] and baguettes. Dry pancakes, waffles, and muffins. Sticky rice. Dry bread stuffing. Granola. Popcorn. Chips. Meats and other proteins Large pieces of meat. Dry, tough meats, such as bacon, sausage, and hot dogs. Chicken, [place] or fish with skin and bones. Crunchy peanut butter. Nuts. Seeds. Nut and seed butters. Dairy Yogurt with nuts, seeds, or large chunks. Large chunks of cheese. Sweets and desserts Dry cakes. Chewy or dry cookies. Any desserts with nuts, seeds, dry fruits, coconut, pineapple, or anything dry, sticky, or hard. Chewy caramel. Licorice. Taffy-type candies. Ask your health care provider  whether you can have frozen desserts. Seasonings and other foods Soups with tough or large chunks of meats, poultry, or vegetables. Corn or clam chowder. Smoothies with large chunks of fruit. The items listed above may not be a complete list of foods and beverages you should avoid. Contact a dietitian for more information. Summary Bite-size foods can be helpful for people with swallowing problems. On this dysphagia eating plan, you may eat foods that are soft, moist, and cut into pieces smaller than your thumbnail (about 15 mm by 15 mm for adults and 58m by 8 mm for children). You may  be instructed to thicken liquids. Follow your health care provider's instructions about how to do this and to what consistency. This information is not intended to replace advice given to you by your health care provider. Make sure you discuss any questions you have with your health care provider. Document Revised: 11/06/2021 Document Reviewed: 11/06/2021 Elsevier Patient Education  2[place]RN, MSN, [facility]  Mobile: [phone]

## 2022-06-18 NOTE — Patient Outreach (Signed)
  Care Coordination   Initial Visit Note   06/18/2022 Name: Manuel Cook MRN: [id number] DOB: 1946/07/18  Manuel Cook is a 76 y.o. year old male who sees Manuel Harrier, MD for primary care. I  spoke with the patients wife Manuel Cook  What matters to the patients health and wellness today?  The patient is having issues with swallowing, has had his esophagus stretched before.     Goals Addressed             This Visit's Progress    RNCM: Swallowing difficulties       Care Coordination Interventions: Evaluation of current treatment plan related to swallowing difficulty and the patient having his esophagus stretched before and patient's adherence to plan as established by provider Advised patient to have the patient call the [facility] for assessment and review of the patients signs and symptoms.  The patients wife states that he is getting "choked" when eating and she is wanting him to follow up with the provider. Provided education to patient re: dysphagia and will attach educational information to the AVS for review and education Discussed plans with patient for ongoing care management follow up and provided patient with direct contact information for care management team Advised patient to discuss changes in his swallowing  with provider Screening for signs and symptoms of depression related to chronic disease state  Assessed social determinant of health barriers Spoke to the patients wife and the wife will talk to the patient and ask the patient to call the [facility] for review and education. Review of the goals of the care coordination program and resources of the nurse, SW and pharmacist. The wife has the [facility] number and also the [facility] provided information of leaving a message on the patients voicemail earlier.            SDOH assessments and interventions completed:  Yes  SDOH Interventions Today    Flowsheet Row Most Recent Value  SDOH Interventions   Food Insecurity  Interventions Intervention Not Indicated  Housing Interventions Intervention Not Indicated  Transportation Interventions Intervention Not Indicated  Utilities Interventions Intervention Not Indicated  Financial Strain Interventions Intervention Not Indicated  Stress Interventions Intervention Not Indicated  Social Connections Interventions Intervention Not Indicated        Care Coordination Interventions Activated:  Yes  Care Coordination Interventions:  Yes, provided   Follow up plan:  the wife to talk to the patient and have the patient call the RNCM back    Encounter Outcome:  Pt. Visit Completed   Manuel Larsson RN, MSN, [place]  Mobile: [phone]

## 2022-06-23 DIAGNOSIS — M9905 Segmental and somatic dysfunction of pelvic region: Secondary | ICD-10-CM | POA: Diagnosis not present

## 2022-06-23 DIAGNOSIS — M5136 Other intervertebral disc degeneration, lumbar region: Secondary | ICD-10-CM | POA: Diagnosis not present

## 2022-06-23 DIAGNOSIS — M955 Acquired deformity of pelvis: Secondary | ICD-10-CM | POA: Diagnosis not present

## 2022-06-23 DIAGNOSIS — M9903 Segmental and somatic dysfunction of lumbar region: Secondary | ICD-10-CM | POA: Diagnosis not present

## 2022-07-20 ENCOUNTER — Ambulatory Visit
Admit: 2022-07-20 | Discharge: 2022-07-21 | Payer: PRIVATE HEALTH INSURANCE | Attending: Dermatology | Primary: Dermatology

## 2022-07-20 DIAGNOSIS — L821 Other seborrheic keratosis: Principal | ICD-10-CM

## 2022-07-20 DIAGNOSIS — D229 Melanocytic nevi, unspecified: Principal | ICD-10-CM

## 2022-07-20 DIAGNOSIS — E781 Pure hyperglyceridemia: Principal | ICD-10-CM

## 2022-07-20 DIAGNOSIS — E032 Hypothyroidism due to medicaments and other exogenous substances: Principal | ICD-10-CM

## 2022-07-20 DIAGNOSIS — C8400 Mycosis fungoides, unspecified site: Principal | ICD-10-CM

## 2022-07-20 DIAGNOSIS — D1801 Hemangioma of skin and subcutaneous tissue: Principal | ICD-10-CM

## 2022-07-20 DIAGNOSIS — L814 Other melanin hyperpigmentation: Principal | ICD-10-CM

## 2022-07-20 DIAGNOSIS — Z85828 Personal history of other malignant neoplasm of skin: Principal | ICD-10-CM

## 2022-07-20 DIAGNOSIS — L578 Other skin changes due to chronic exposure to nonionizing radiation: Principal | ICD-10-CM

## 2022-07-20 DIAGNOSIS — D72821 Monocytosis (symptomatic): Secondary | ICD-10-CM | POA: Diagnosis not present

## 2022-07-20 MED ORDER — TRIAMCINOLONE ACETONIDE 0.1 % TOPICAL OINTMENT
Freq: Two times a day (BID) | TOPICAL | 2 refills | 0.00000 days | Status: CP
Start: 2022-07-20 — End: ?

## 2022-07-21 DIAGNOSIS — M9903 Segmental and somatic dysfunction of lumbar region: Secondary | ICD-10-CM | POA: Diagnosis not present

## 2022-07-21 DIAGNOSIS — M5136 Other intervertebral disc degeneration, lumbar region: Secondary | ICD-10-CM | POA: Diagnosis not present

## 2022-07-21 DIAGNOSIS — M955 Acquired deformity of pelvis: Secondary | ICD-10-CM | POA: Diagnosis not present

## 2022-07-21 DIAGNOSIS — M9905 Segmental and somatic dysfunction of pelvic region: Secondary | ICD-10-CM | POA: Diagnosis not present

## 2022-07-24 DIAGNOSIS — C8400 Mycosis fungoides, unspecified site: Principal | ICD-10-CM

## 2022-07-24 DIAGNOSIS — M9905 Segmental and somatic dysfunction of pelvic region: Secondary | ICD-10-CM | POA: Diagnosis not present

## 2022-07-24 DIAGNOSIS — M9903 Segmental and somatic dysfunction of lumbar region: Secondary | ICD-10-CM | POA: Diagnosis not present

## 2022-07-24 DIAGNOSIS — M5136 Other intervertebral disc degeneration, lumbar region: Secondary | ICD-10-CM | POA: Diagnosis not present

## 2022-07-24 DIAGNOSIS — M955 Acquired deformity of pelvis: Secondary | ICD-10-CM | POA: Diagnosis not present

## 2022-07-24 MED ORDER — BEXAROTENE 75 MG CAPSULE
ORAL_CAPSULE | Freq: Every day | ORAL | 3 refills | 30.00000 days | Status: CP
Start: 2022-07-24 — End: ?

## 2022-07-27 DIAGNOSIS — R0989 Other specified symptoms and signs involving the circulatory and respiratory systems: Secondary | ICD-10-CM | POA: Diagnosis not present

## 2022-07-29 ENCOUNTER — Ambulatory Visit: Admit: 2022-07-29 | Discharge: 2022-08-27 | Payer: PRIVATE HEALTH INSURANCE

## 2022-07-29 ENCOUNTER — Ambulatory Visit
Admit: 2022-07-29 | Discharge: 2022-08-15 | Payer: PRIVATE HEALTH INSURANCE | Attending: Student in an Organized Health Care Education/Training Program | Primary: Student in an Organized Health Care Education/Training Program

## 2022-07-29 ENCOUNTER — Ambulatory Visit: Admit: 2022-07-29 | Payer: PRIVATE HEALTH INSURANCE | Attending: Radiation Oncology | Primary: Radiation Oncology

## 2022-07-31 DIAGNOSIS — C8400 Mycosis fungoides, unspecified site: Principal | ICD-10-CM

## 2022-07-31 MED ORDER — TARGRETIN 75 MG CAPSULE
ORAL_CAPSULE | Freq: Every day | ORAL | 3 refills | 30.00000 days | Status: CP
Start: 2022-07-31 — End: ?

## 2022-08-04 NOTE — Unmapped (Signed)
[facility] Specialty Medication Onboarding    Specialty Medication: Targretin 75mg  tablet  Prior Authorization: Approved   Financial Assistance: Yes - copay card approved as secondary   Final Copay/Day Supply: $0 / 30 days    Insurance Restrictions: None     Notes to Pharmacist: I wasn't able to confirm that it will be a $0 copay, but the copay card leaves $1,624.27 and provides a phone number to call for payment. I attempted to call but was on hold for 30 minutes and never reached anyone.    The triage team has completed the benefits investigation and has determined that the patient is able to fill this medication at [facility]. Please contact the patient to complete the onboarding or follow up with the prescribing physician as needed.

## 2022-08-05 NOTE — Unmapped (Signed)
-----   Message from Newt Minion, PharmD sent at 08/05/2022 11:45 AM EST -----  Regarding: Bexarotene copay  Dr Odis Luster,     Both brand and generic Targretin copays are high and not feasible for Mr Frantom.  He would like to talk to you about this and other options.  He requested a call back from your office.    Thanks,   Ro[place]   [place] [facility]

## 2022-08-05 NOTE — Unmapped (Signed)
I called and left a message for patient. Will try again tomorrow. May discuss trial of acitretin as an alternative

## 2022-08-05 NOTE — Unmapped (Unsigned)
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  I am speaking to {Blank:19197::the patient,the patient's caregiver, ***,the patient's family member, ***,***}.    Was a Nurse, learning disability used for this call? No    Verified patient's date of birth / HIPAA.    Specialty medication(s) to be sent: Hematology/Oncology: Targretin      Non-specialty medications/supplies to be sent: n/a      Medications not needed at this time: n/a       Bexarotene      Medication & Administration      Dosage: Take 2 capsules (150 mg) by mouth daily     Administration: Take at the same time of day with a meal. Do not chew, open or crush capsule.        Adherence/Missed dose instructions: take missed dose as soon as you remember. If it is close to the time of your next dose, skip the dose and resume with your next scheduled dose.     Goals of Therapy      - Reduce symptoms of disease, including itching and rash  - To prevent disease progression     Side Effects & Monitoring Parameters   ?? Headache   ?? Trouble sleeping   ?? Not hungry   ?? Upset stomach/diarrhea   ?? Hair loss   ?? Dry skin   ?? Feeling tired   ?? Flu-like signs  ?? Increased risk of getting sunburned     Monitoring:        - CBC        - Thyroid function        - LFTs        - Lipids     The following side effects should be reported to the provider:  - S/s of allergic reaction   - Fever, chills, cough with sputum or change in sputum, mouth sores   - Change in eyesight   - Swelling in arms or legs        Contraindications, Warnings, & Precautions      Taking Gemfibrozil   Currently pregnant or breast feeding   Reduce alcohol intake     Drug/Food Interactions      Medication list reviewed in Epic. The patient was instructed to inform the care team before taking any new medications or supplements.    CYP3A4 Inducers (Moderate) may decrease the serum concentration of Atorvastatin - monitor Cholesterol for signs of decreased Atorvastatin        Storage, Handling Precautions, & Disposal   ?? Store this medication at room temperature.  ?? Medication is NIOSH Class 1 - do not allow others to handle medication, and wash hands after handling.        Current Medications (including OTC/herbals), Comorbidities and Allergies     Current Outpatient Medications   Medication Sig Dispense Refill   ??? alfuzosin (UROXATRAL) 10 mg 24 hr tablet Take by mouth.     ??? aspirin (ECOTRIN) 81 MG tablet Take 1 tablet (81 mg total) by mouth.     ??? atenolol (TENORMIN) 25 MG tablet Take by mouth.     ??? atorvastatin (LIPITOR) 20 MG tablet Take 1 tablet (20 mg total) by mouth.     ??? TARGRETIN 75 mg capsule Take 2 capsules (150 mg total) by mouth daily. Brand  name medically necessary. 60 capsule 3   ??? 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 diazePAM (VALIUM) 5 MG tablet 1 tab 30 minutes before MRI     ??? diclofenac (VOLTAREN) 75 MG EC tablet Take 1 tablet (75 mg total) by mouth.     ??? folic acid (FOLVITE) 1 MG tablet Take 3 pills daily on non methotrexate days 240 tablet 3   ??? mechlorethamine 0.016 % Gel Apply a thin layer to lymphoma lesions daily until resolved 60 g 5   ??? mechlorethamine 0.016 % Gel Apply a thin layer to lymphoma lesions daily until resolved     ??? methotrexate 2.5 MG tablet Take 8 tablets (20 mg) on one day per week 96 tablet 1   ??? miSOPROStol (CYTOTEC) 200 MCG tablet Take 1 tablet (200 mcg total) by mouth.     ??? omega-3 fatty acids-vitamin E 1,000 mg cap by Miscellaneous route.     ??? triamcinolone (KENALOG) 0.1 % ointment Apply topically two (2) times a day. To rash until resolved 454 g 2     No current facility-administered medications for this visit.       Allergies   Allergen Reactions   ??? Sulfa (Sulfonamide Antibiotics) Other (See Comments)   ??? Sulfacetamide Sodium        Patient Active Problem List   Diagnosis   ??? Chronic coronary artery disease   ??? Hyperlipidemia   ??? Cervical vertebral fusion   ??? Mycosis fungoides (CMS-HCC)   ??? Primary osteoarthritis of both hands   ??? Plantar fasciitis   ??? Status post lumbar spine operation   ??? 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 History of nonmelanoma skin cancer       Reviewed and up to date in Epic.    Appropriateness of Therapy     Acute infections noted within Epic:  No active infections  Patient reported infection: {Blank single:19197::None,***- patient reported to provider,***- pharmacy reported to provider}    Is medication and dose appropriate based on diagnosis and infection status? Yes    Prescription has been clinically reviewed: Yes      Baseline Quality of Life Assessment      How many days over the past month did your condition  keep you from your normal activities? For example, brushing your teeth or getting up in the morning. {Blank:19197::0,***,Patient declined to answer}    Financial Information     Medication Assistance provided: Prior Authorization and Copay Assistance    Anticipated copay of $0 reviewed with patient. Verified delivery address.    Delivery Information     Scheduled delivery date: ***    Expected start date: ***    Medication will be delivered via UPS to the prescription address in Epic [place].  This shipment will not require a signature.      Explained the services we provide at [facility] and that each month we would call to set up refills.  Stressed importance of returning phone calls so that we could ensure they receive their medications in time each month.  Informed patient that we should be setting up refills 7-10 days prior to when they will run out of medication.  A pharmacist will reach out to perform a clinical assessment periodically.  Informed patient that a welcome packet, containing information about our pharmacy and other support services, a Notice of Privacy Practices, and  a drug information handout will be sent.      The patient or caregiver noted above participated in the development of this care plan and knows that they can request review of or adjustments to the care plan at any time.      Patient or caregiver verbalized understanding of the above information as well as how to contact the pharmacy at [phone] option 4 with any questions/concerns.  The pharmacy is open Monday through Friday 8:30am-4:30pm.  A pharmacist is available 24/7 via pager to answer any clinical questions they may have.    Patient Specific Needs     - Does the patient have any physical, cognitive, or cultural barriers? {Blank single:19197::No,Yes - ***}    - Does the patient have adequate living arrangements? (i.e. the ability to store and take their medication appropriately) {Blank single:19197::Yes,No - ***}    - Did you identify any home environmental safety or security hazards? {Blank single:19197::No,Yes - ***}    - Patient prefers to have medications discussed with  Patient     - Is the patient or caregiver able to read and understand education materials at a high school level or above? Yes    - Patient's primary language is  English     - Is the patient high risk? {sschighriskpts:78327}    SOCIAL DETERMINANTS OF HEALTH     At the [facility], we have learned that life circumstances - like trouble affording food, housing, utilities, or transportation can affect the health of many of our patients.   That is why we wanted to ask: are you currently experiencing any life circumstances that are negatively impacting your health and/or quality of life? {YES/NO/PATIENTDECLINED:93004}    Social Determinants of Health     Financial Resource Strain: Not on file   Internet Connectivity: Not on file   Food Insecurity: Not on file   Tobacco Use: Medium Risk (07/29/2022)    Patient History    ??? Smoking Tobacco Use: Former    ??? Smokeless Tobacco Use: Never    ??? Passive Exposure: Not on file   Housing/Utilities: Not on file   Alcohol Use: Not on file   Transportation Needs: Not on file   Substance Use: Not on file   Health Literacy: Not on file   Physical Activity: Not on file   Interpersonal Safety: Not on file   Stress: Not on file   Intimate Partner Violence: Not on file   Depression: Not on file   Social Connections: Not on file       Would you be willing to receive help with any of the needs that you have identified today? {Yes/No/Not applicable:93005}       Alahni Varone Vangie Bicker, PharmD  [facility] Specialty Pharmacist

## 2022-08-06 NOTE — Unmapped (Signed)
I called and spoke to patient. He reports that he has applied independently for financial assistance through a cancer support program and thinks he may be able to get Targretin affordably through this program, although he is waiting to hear the outcome of this application. Plan is to keep consult with rad onc next week to discuss treatment of lesions on arms. He has f/up with me in Dec and f/up in the multidisciplinary clinic in January. If unable to get Targretin, could consider trial of acitretin or other therapy.

## 2022-08-06 NOTE — Unmapped (Signed)
-----   Message from Newt Minion, PharmD sent at 08/05/2022 11:45 AM EST -----  Regarding: Bexarotene copay  Dr Odis Luster,     Both brand and generic Targretin copays are high and not feasible for Mr Frantom.  He would like to talk to you about this and other options.  He requested a call back from your office.    Thanks,   Ro[place]   [place] [facility]

## 2022-08-11 DIAGNOSIS — I6523 Occlusion and stenosis of bilateral carotid arteries: Secondary | ICD-10-CM | POA: Diagnosis not present

## 2022-08-11 DIAGNOSIS — R0989 Other specified symptoms and signs involving the circulatory and respiratory systems: Secondary | ICD-10-CM | POA: Diagnosis not present

## 2022-08-14 DIAGNOSIS — C8400 Mycosis fungoides, unspecified site: Secondary | ICD-10-CM | POA: Diagnosis not present

## 2022-08-14 DIAGNOSIS — Z882 Allergy status to sulfonamides status: Secondary | ICD-10-CM | POA: Diagnosis not present

## 2022-08-14 NOTE — Unmapped (Signed)
08/14/2022      Subjective/Assessment/Recommendations:    1. Prior radiation: No prior radiation treatment  2. Pacemaker/defibrillator: No  3. Pregnancy: Male  4. Patient education: Patient education materials and attending physician packet provided  5. Meaningful use: Reviewed  6. Resources: Financial  7. Other:

## 2022-08-14 NOTE — Unmapped (Signed)
Radiation Oncology Consultation Note    Patient Name: Phillip Knox  Date of Service: 08/14/2022     Referring Physician: Marylene Land    Assessment    Phillip Knox is a 76 y.o. male who presents with history of mycosis fungoides, previously plaque stage, T1bN0M0B0 (Stage IB) - early T1b with ~ 10-20% BSA now with 3 mycosis fungoide tumors on the arms- new staging likely Stage IIB (T3N0M0B0).    Plan    Quick Plan:    Today we discussed the rationale, risks, benefits, and logistics of radiation therapy.     We discussed that the main side effect of radiation will be skin irritation including blistering, drainage, erythema, etc.    At the end of the consultation all of the patient's questions were answered to their satisfaction. He was in agreement with our plan.  A CT simulation will be performed in our clinic the week of November 27th to begin the planning process for the patient's radiation therapy.   We plan to treat the right elbow mycosis fungoide lesions to a total dose of 24 Gy in 12 fractions delivered every other day.  Tentative start date will be early December.    Thank you for allowing me to participate in the care of this pleasant patient.    ----    History of Present Illness   Phillip Knox is a 76 y.o. male who initially presented to medical attention with 3 skin lesions - 10 x 15 cm red plaque with underlying subcutaneous component on the right arm, 3 cm red nodule on the right arm, and L arm 4 cm red nodule. Reports he has some spots on his legs that appear more as a rash. The most bothersome spot is the spot on his right elbow. Biopsy consistent with mycosis fungoides with minimal CD30 staining. The lesions are painful/itchy and he is concerned about ulceration. He has been putting Vaseline on the areas. It took 6 months -1 year for lesion on elbow to advance to how it is currently. Some of the smaller lesions have appeared overnight. In process of attempting to be approved for financial assistance through a cancer support to fund Targretin, brand and generic Targretin copays unaffordable. Approval can take 4-6 weeks. Patient not on systemic therapies otherwise.        Past Medical History    The patient  has a past medical history of Basal cell carcinoma.     Past Surgical History    The patient  has a past surgical history that includes Skin biopsy.     Past Radiation History    The patient denies a history of previous radiation therapy.  The patient does not have a pacemaker.  Regarding pregnancy status, the patient is male.  The patient denies a history of collagen vascular disease.    Medications    The patient has a current medication list which includes the following prescription(s): alfuzosin, aspirin, atenolol, atorvastatin, targretin, clobetasol, diazepam, diclofenac, folic acid, mechlorethamine, mechlorethamine, methotrexate, misoprostol, omega-3 fatty acids-vitamin e, and triamcinolone.    Allergies    Sulfa (sulfonamide antibiotics) and Sulfacetamide sodium     Social History  Social History     Tobacco Use   ??? Smoking status: Former   ??? Smokeless tobacco: Never   Substance Use Topics   ??? Alcohol use: Yes     Alcohol/week: 0.0 standard drinks of alcohol        Family History  Family History  Problem Relation Age of Onset   ??? Melanoma Neg Hx    ??? Basal cell carcinoma Neg Hx    ??? Squamous cell carcinoma Neg Hx      Physical Examination    Wt Readings from Last 1 Encounters:   No data found for Wt     Temp Readings from Last 1 Encounters:   No data found for Temp     BP Readings from Last 1 Encounters:   No data found for BP     Pulse Readings from Last 1 Encounters:   No data found for Pulse     SpO2 Readings from Last 1 Encounters:   No data found for SpO2      General:  The patient is sitting comfortably in no acute distress.  HEENT:  Normocephalic, atraumatic  Lymph:  No palpable lymphadenopathy   CV:  Regular rate and rhythm  Resp:  Good inspiratory effort  Abd:  Soft, non-distended  Back:  No bony tenderness  Ext:  No clubbing, cyanosis, or edema  Neuro:  CN II-XII grossly intact and symmetric, no focal neurologic deficits  Skin:  Right arm- 10 x 15 cm red plaque with underlying subcutaneous component. Right arm with 3 cm red nodule. L arm 4 cm red nodule      ECOG Performance Status: 0 = Fully active, able to carry on all pre-disease performance without restriction    Imaging and Pathology: As documented in the HPI.      ----------------------------------------------------------------------------------------------------------------------  August 14, 2022 11:48 AM. Documentation assistance provided by Marlene Bast, medical scribe, at the direction of Levone Otten, Su Hilt, MD.  ----------------------------------------------------------------------------------------------------------------------   Dorris Carnes. Alonna Buckler, MD, PhD  Assistant Professor  Department of Radiation Oncology  [facility]  [id number]  [place], [place] [address]  O: [phone]  08/14/22 11:49 AM

## 2022-08-17 ENCOUNTER — Ambulatory Visit
Admission: EM | Admit: 2022-08-17 | Discharge: 2022-08-17 | Disposition: A | Discharge status: Home / Self Care | Payer: PPO

## 2022-08-17 DIAGNOSIS — S61210A Laceration without foreign body of right index finger without damage to nail, initial encounter: Secondary | ICD-10-CM

## 2022-08-17 MED ORDER — CLINDAMYCIN HCL 300 MG PO CAPS: 300.0000 mg | ORAL_CAPSULE | Freq: Four times a day (QID) | ORAL | 0 refills | Status: AC

## 2022-08-17 NOTE — ED Provider Notes (Signed)
[facility]    CSN: [id number] Arrival date & time: 08/17/22  1012    HISTORY   Chief Complaint  Patient presents with   Finger Injury   HPI Manuel Cook is a pleasant, 76 y.o. male who presents to urgent care today. Pt c/o a laceration to the right index finger after getting it caught between his trailer hitch 2 days ago.  Patient reports normal range of motion and sensation of right index finger, states the skin overlying his proximal interphalangeal joint peeled away when he injured it.  The history is provided by the patient.   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   Microscopic hematuria 05/01/2015   Atherosclerotic heart disease of native coronary artery without angina pectoris 02/20/2015   Osteoarthritis 03/12/2014   Mycosis fungoides, unspecified site ([place]) 05/24/2012   Cervical vertebral fusion 05/03/2012   Hyperlipidemia 05/03/2012   Presence of coronary angioplasty implant and graft 05/03/2012   Status post lumbar spine operation 05/03/2012   Plantar fasciitis 04/28/2011   Past Surgical History:  Procedure Laterality Date   ANTERIOR LAT LUMBAR FUSION N/A 04/08/2021   Procedure: Lumbar one-two Lumbar two-three Anterolateral lumbar interbody fusion with posterior percutaneous fixation Lumbar one to Lumbar three, removal of old hardware at Lumbar four-five;  Surgeon: Kristeen Miss, MD;  Location: [place];  Service: Neurosurgery;  Laterality: N/A;   BACK  SURGERY     CARDIAC CATHETERIZATION     COLONOSCOPY WITH PROPOFOL N/A 12/22/2016   Procedure: COLONOSCOPY WITH PROPOFOL;  Surgeon: Lollie Sails, MD;  Location: [facility] ENDOSCOPY;  Service: Endoscopy;  Laterality: N/A;   CORONARY ANGIOPLASTY WITH STENT PLACEMENT     ESOPHAGOGASTRODUODENOSCOPY N/A 09/12/2021   Procedure: ESOPHAGOGASTRODUODENOSCOPY (EGD);  Surgeon: Annamaria Helling, DO;  Location: [facility] ENDOSCOPY;  Service: Gastroenterology;  Laterality: N/A;   EYE SURGERY     LUMBAR FUSION  10/28/2009   L3-5 PLIF   LUMBAR PERCUTANEOUS PEDICLE SCREW 2 LEVEL N/A 04/08/2021   Procedure: LUMBAR PERCUTANEOUS PEDICLE SCREW LUMBAR ONE TO THREE;  Surgeon: Kristeen Miss, MD;  Location: [place];  Service: Neurosurgery;  Laterality: N/A;   Neck fusion  [place] Medications    Prior to Admission medications   Medication Sig Start Date End Date Taking? Authorizing Provider  bexarotene (TARGRETIN) 75 MG CAPS capsule Take by mouth. 07/24/22  Yes [provider]  acetaminophen (TYLENOL) 500 MG tablet Take 1,000 mg by mouth every 6 (six) hours as needed for moderate pain or headache.    [provider]  aspirin 81 MG EC tablet Take 81 mg by mouth daily.      [provider]  atenolol (TENORMIN) 25 MG tablet Take by mouth daily.    [provider]  atorvastatin (LIPITOR) 20 MG tablet Take 20 mg by mouth daily.    [provider]  augmented  betamethasone dipropionate (DIPROLENE-AF) 0.05 % ointment Apply topically 2 (two) times daily.    [provider]  clobetasol cream (TEMOVATE) 0.08 % Apply 1 application topically 2 (two) times daily.    [provider]  diclofenac (VOLTAREN) 75 MG EC tablet Take 75 mg by mouth 2 (two) times daily.    [provider]  ezetimibe (ZETIA) 10 MG tablet Take 10 mg by mouth daily.    [provider]  gabapentin (NEURONTIN) 100 MG capsule Take 100 mg by mouth 3 (three) times  daily.    [provider]  Melatonin 1 MG CAPS Take 1 mg by mouth at bedtime as needed (sleep).    [provider]  methocarbamol (ROBAXIN) 500 MG tablet Take 1 tablet (500 mg total) by mouth every 6 (six) hours as needed for muscle spasms. 04/10/21   Kristeen Miss, MD  misoprostol (CYTOTEC) 200 MCG tablet Take 200 mcg by mouth 2 (two) times daily.    [provider]  oxyCODONE-acetaminophen (PERCOCET/ROXICET) 5-325 MG tablet Take 1-2 tablets by mouth every 4 (four) hours as needed for moderate pain or severe pain. 04/10/21   Kristeen Miss, MD  pantoprazole (PROTONIX) 40 MG tablet Take 40 mg by mouth daily as needed (acid reflux). 11/25/20   [provider]  triamcinolone ointment (KENALOG) 0.1 % Apply 1 application topically 2 (two) times daily.    [provider]    Family History Family History  Problem Relation Age of Onset   Kidney failure Brother    Heart attack Father        Brother   Diabetes Mellitus II Brother    Prostate cancer Neg Hx    Social History Social History   Tobacco Use   Smoking status: Former   Smokeless tobacco: Never   Tobacco comments:    qiut age 69  Substance Use Topics   Alcohol use: Yes    Alcohol/week: 0.0 standard drinks of alcohol    Comment: social   Drug use: No   Allergies   Sulfa antibiotics  Review of Systems Review of Systems Pertinent findings revealed after performing a 14 point review of systems has been noted in the history of present illness.  Physical Exam Triage Vital Signs ED Triage Vitals  Enc Vitals Group     BP 07/25/21 0827 (!) 147/82     Pulse Rate 07/25/21 0827 72     Resp 07/25/21 0827 18     Temp 07/25/21 0827 98.3 F (36.8 C)     Temp Source 07/25/21 0827 Oral     SpO2 07/25/21 0827 98 %     Weight --      Height --      Head Circumference --      Peak Flow --      Pain Score 07/25/21 0826 5     Pain Loc --      Pain Edu? --      Excl. in [place]? --   No data  found.  Updated Vital Signs BP (!) 148/68 (BP Location: Right Arm)   Pulse (!) 57   Temp 97.9 F (36.6 C)   Resp 17   SpO2 96%   Physical Exam Vitals and nursing note reviewed.  Constitutional:      General: He is not in acute distress.    Appearance: Normal appearance. He is normal weight. He is not ill-appearing.  HENT:     Head: Normocephalic and atraumatic.  Eyes:  Extraocular Movements: Extraocular movements intact.     Conjunctiva/sclera: Conjunctivae normal.     Pupils: Pupils are equal, round, and reactive to light.  Cardiovascular:     Rate and Rhythm: Normal rate and regular rhythm.  Pulmonary:     Effort: Pulmonary effort is normal.     Breath sounds: Normal breath sounds.  Musculoskeletal:        General: Normal range of motion.       Hands:     Cervical back: Normal range of motion and neck supple.  Skin:    General: Skin is warm and dry.     Findings: Lesion (Laceration posterior right index finger at PIP with surrounding erythema and scant bleeding that is mildly indurated with tenderness to palpation) present.  Neurological:     General: No focal deficit present.     Mental Status: He is alert and oriented to person, place, and time. Mental status is at baseline.  Psychiatric:        Mood and Affect: Mood normal.        Behavior: Behavior normal.        Thought Content: Thought content normal.        Judgment: Judgment normal.     Visual Acuity Right Eye Distance:   Left Eye Distance:   Bilateral Distance:    Right Eye Near:   Left Eye Near:    Bilateral Near:     UC Couse / Diagnostics / Procedures:     Radiology No results found.  Procedures Procedures (including critical care time) EKG  Pending results:  Labs Reviewed - No data to display  Medications Ordered in UC: Medications - No data to display  UC Diagnoses / Final Clinical Impressions(s)   I have reviewed the triage vital signs and the nursing notes.  Pertinent labs  & imaging results that were available during my care of the patient were reviewed by me and considered in my medical decision making (see chart for details).    Final diagnoses:  Laceration of right index finger without foreign body without damage to nail, initial encounter   Patient advised that due to the age of the wound, we will not be able to suture it closed.  Patient provided with a prescription for antibiotics for MRSA coverage, clindamycin chosen given patient's history of allergy to sulfa antibiotics.  Single Steri-Strip was placed across the center of the wound to keep the skin intact, wound was covered with nonstick bandage and wrapped in Coban and splint was provided to protect the wound while healing.  Instructions provided for wound care and AVS.  Return precautions advised.  ED Prescriptions     Medication Sig Dispense Auth. Provider   clindamycin (CLEOCIN) 300 MG capsule Take 1 capsule (300 mg total) by mouth 4 (four) times daily for 5 days. 20 capsule Lynden Oxford Scales, PA-C      PDMP not reviewed this encounter.  Pending results:  Labs Reviewed - No data to display  Discharge Instructions:   Discharge Instructions      Please see enclosed information about caring for the laceration of the right index finger.  Please pick up and begin taking clindamycin, an antibiotic to help "clean up" your wound from the inside out.  Please return for repeat evaluation if your wound looks worse despite antibiotic treatment and regular bandage changes.  Thank you for visiting urgent care.      Disposition Upon Discharge:  Condition: stable for discharge home  Patient presented with an acute illness with associated systemic symptoms and significant discomfort requiring urgent management. In my opinion, this is a condition that a prudent lay person (someone who possesses an average knowledge of health and medicine) may potentially expect to result in complications if not  addressed urgently such as respiratory distress, impairment of bodily function or dysfunction of bodily organs.   Routine symptom specific, illness specific and/or disease specific instructions were discussed with the patient and/or caregiver at length.   As such, the patient has been evaluated and assessed, work-up was performed and treatment was provided in alignment with urgent care protocols and evidence based medicine.  Patient/parent/caregiver has been advised that the patient may require follow up for further testing and treatment if the symptoms continue in spite of treatment, as clinically indicated and appropriate.  Patient/parent/caregiver has been advised to return to the [facility] or PCP if no better; to PCP or the Emergency Department if new signs and symptoms develop, or if the current signs or symptoms continue to change or worsen for further workup, evaluation and treatment as clinically indicated and appropriate  The patient will follow up with their current PCP if and as advised. If the patient does not currently have a PCP we will assist them in obtaining one.   The patient may need specialty follow up if the symptoms continue, in spite of conservative treatment and management, for further workup, evaluation, consultation and treatment as clinically indicated and appropriate.   Patient/parent/caregiver verbalized understanding and agreement of plan as discussed.  All questions were addressed during visit.  Please see discharge instructions below for further details of plan.  This office note has been dictated using Museum/gallery curator.  Unfortunately, this method of dictation can sometimes lead to typographical or grammatical errors.  I apologize for your inconvenience in advance if this occurs.  Please do not hesitate to reach out to me if clarification is needed.      Lynden Oxford Scales, PA-C 08/18/22 1727

## 2022-08-17 NOTE — ED Triage Notes (Signed)
Pt. Presents to UC w/ c/o a laceration to the right index finger after getting it caught between his trailer hitch 2 days ago.

## 2022-08-17 NOTE — Discharge Instructions (Addendum)
Please see enclosed information about caring for the laceration of the right index finger.  Please pick up and begin taking clindamycin, an antibiotic to help "clean up" your wound from the inside out.  Please return for repeat evaluation if your wound looks worse despite antibiotic treatment and regular bandage changes.  Thank you for visiting urgent care.

## 2022-08-25 DIAGNOSIS — C8400 Mycosis fungoides, unspecified site: Secondary | ICD-10-CM | POA: Diagnosis not present

## 2022-08-27 NOTE — Unmapped (Signed)
Addended by: Janece Canterbury on: 08/27/2022 08:52 AM     Modules accepted: Orders

## 2022-08-28 ENCOUNTER — Ambulatory Visit
Admit: 2022-08-28 | Discharge: 2022-09-27 | Payer: PRIVATE HEALTH INSURANCE | Attending: Student in an Organized Health Care Education/Training Program | Primary: Student in an Organized Health Care Education/Training Program

## 2022-08-28 ENCOUNTER — Ambulatory Visit
Admit: 2022-08-28 | Discharge: 2022-09-19 | Payer: PRIVATE HEALTH INSURANCE | Attending: Student in an Organized Health Care Education/Training Program | Primary: Student in an Organized Health Care Education/Training Program

## 2022-08-28 ENCOUNTER — Ambulatory Visit
Admit: 2022-08-28 | Discharge: 2022-09-12 | Payer: PRIVATE HEALTH INSURANCE | Attending: Student in an Organized Health Care Education/Training Program | Primary: Student in an Organized Health Care Education/Training Program

## 2022-08-28 ENCOUNTER — Ambulatory Visit: Admit: 2022-08-28 | Payer: PRIVATE HEALTH INSURANCE

## 2022-08-28 ENCOUNTER — Ambulatory Visit: Admit: 2022-08-28 | Discharge: 2022-09-09 | Payer: PRIVATE HEALTH INSURANCE

## 2022-08-28 DIAGNOSIS — C8400 Mycosis fungoides, unspecified site: Principal | ICD-10-CM

## 2022-08-28 MED ORDER — TARGRETIN 75 MG CAPSULE
ORAL_CAPSULE | Freq: Every day | ORAL | 3 refills | 30.00000 days | Status: CP
Start: 2022-08-28 — End: ?

## 2022-08-28 NOTE — Unmapped (Cosign Needed)
RADIATION ONCOLOGY TREATMENT PLANNING NOTE     Patient Name: Phillip Knox  Patient Age: 76 y.o.  Date of Encounter: 08/25/2022    CLINICAL TREATMENT PLANNING:     SINAI RIVENBURGH has mycosis fungioides. Refer to the consult for full clinical details.    I plan to treat him/her with electrons utilizing [employer] Electrons technique.     The radiation target area/treatment site will be L arm plaque. Clinical set up at time of treatment.    I will attempt to minimize the dose to circumferential arm, elbow.    The total radiation dose will be 2400 cGy at 200 cGy/fraction for a total of 12 fractions, treated daily.    Treatment Intent: palliative.    Technique Rationale:     2D: Basic 2 dimensional treatment planning and delivery. No DVH. Organs at risk not clinically required for this case.      I formulated the above assessment and plan with the attending, Dr. Camelia Phenes who attended the simluation.    George Hugh, MD  Resident Physician  Department of Radiation Oncology   [facility]

## 2022-09-03 DIAGNOSIS — R0609 Other forms of dyspnea: Secondary | ICD-10-CM | POA: Diagnosis not present

## 2022-09-03 DIAGNOSIS — I2510 Atherosclerotic heart disease of native coronary artery without angina pectoris: Secondary | ICD-10-CM | POA: Diagnosis not present

## 2022-09-03 DIAGNOSIS — R0989 Other specified symptoms and signs involving the circulatory and respiratory systems: Secondary | ICD-10-CM | POA: Diagnosis not present

## 2022-09-03 DIAGNOSIS — I209 Angina pectoris, unspecified: Secondary | ICD-10-CM | POA: Diagnosis not present

## 2022-09-03 DIAGNOSIS — R001 Bradycardia, unspecified: Secondary | ICD-10-CM | POA: Diagnosis not present

## 2022-09-03 DIAGNOSIS — Z955 Presence of coronary angioplasty implant and graft: Secondary | ICD-10-CM | POA: Diagnosis not present

## 2022-09-07 DIAGNOSIS — Z882 Allergy status to sulfonamides status: Secondary | ICD-10-CM | POA: Diagnosis not present

## 2022-09-07 DIAGNOSIS — C8400 Mycosis fungoides, unspecified site: Secondary | ICD-10-CM | POA: Diagnosis not present

## 2022-09-08 ENCOUNTER — Ambulatory Visit: Admit: 2022-09-08 | Discharge: 2022-09-09

## 2022-09-08 DIAGNOSIS — C8400 Mycosis fungoides, unspecified site: Secondary | ICD-10-CM | POA: Diagnosis not present

## 2022-09-09 ENCOUNTER — Ambulatory Visit
Admit: 2022-09-09 | Discharge: 2022-09-10 | Payer: PRIVATE HEALTH INSURANCE | Attending: Radiation Oncology | Primary: Radiation Oncology

## 2022-09-10 ENCOUNTER — Ambulatory Visit: Admit: 2022-09-10 | Discharge: 2022-09-11

## 2022-09-10 NOTE — Unmapped (Signed)
Have communicated w/ patient who prefers to pursue financial assistance for Targretin for now, submitted [facility] paperwork

## 2022-09-11 ENCOUNTER — Ambulatory Visit: Admit: 2022-09-11 | Discharge: 2022-09-12 | Payer: PRIVATE HEALTH INSURANCE

## 2022-09-11 NOTE — Unmapped (Unsigned)
09/11/2022    Dose Site Summary:    Subjective/Assessment/Recommendations:    1. Fatigue: no issues  2. Pain: no pain  3. Elimination: no issues  4. Prescription Needs:none  5. Psychosocial:pt has home support if needed

## 2022-09-14 ENCOUNTER — Ambulatory Visit: Admit: 2022-09-14 | Discharge: 2022-09-15

## 2022-09-14 NOTE — Unmapped (Signed)
Radiation Oncology Treatment Management Note (OTV)    Encounter Date: 09/11/2022  Patient Name: Phillip Knox  Medical Record Number: [id number]    Diagnosis:  MF    Narrative: 76 y.o. male who presents with history of mycosis fungoides, previously plaque stage, T1bN0M0B0 (Stage IB) - early T1b with ~ 10-20% BSA now with 3 mycosis fungoides tumors on the arms- new staging likely Stage IIB (T3N0M0B0).     Assessment & Plan    We discussed that these symptoms are expected given the treated disease, and we will continue supportive measures until they complete radiotherapy.    800 cGy of planned 2400 cGy    Tumor/Palliative response: Partial response   Chemotherapy/Systemic therapy:not administered  Clinical Trial:   no    Plan for Therapy: Continue treatment as planned    Therapeutic Interventions (new in bold)    Aquaphor PRN    Toxicity    Dry skin    Physical Examination    Wt Readings from Last 10 Encounters:   09/11/22 93.2 kg (205 lb 8 oz)   08/14/22 92.1 kg (203 lb)     BP Readings from Last 1 Encounters:   09/11/22 181/84     Pulse Readings from Last 1 Encounters:   09/11/22 55     SpO2 Readings from Last 1 Encounters:   09/11/22 98%       Alert and Orientated X 3.  No acute distress.  Peeling plaques over b/l arms    N. Alonna Buckler, MD, PhD  Assistant Professor  Department of Radiation Oncology  [facility]  [address]  [place], [place] [address]  O: [phone]    09/14/22 1:08 PM

## 2022-09-15 ENCOUNTER — Ambulatory Visit: Admit: 2022-09-15 | Discharge: 2022-09-16 | Payer: PRIVATE HEALTH INSURANCE

## 2022-09-15 DIAGNOSIS — C8400 Mycosis fungoides, unspecified site: Secondary | ICD-10-CM | POA: Diagnosis not present

## 2022-09-16 ENCOUNTER — Ambulatory Visit: Admit: 2022-09-16 | Discharge: 2022-09-17

## 2022-09-17 ENCOUNTER — Ambulatory Visit: Admit: 2022-09-17 | Discharge: 2022-09-18 | Payer: PRIVATE HEALTH INSURANCE

## 2022-09-18 ENCOUNTER — Ambulatory Visit: Admit: 2022-09-18 | Discharge: 2022-09-19

## 2022-09-18 NOTE — Unmapped (Signed)
Radiation Oncology Treatment Management Note (OTV)    Encounter Date: 09/18/2022  Patient Name: Phillip Knox  Medical Record Number: [id number]    Diagnosis:  Mycosis Fungoides    Narrative: 76 y.o. male who presents with history of mycosis fungoides, previously plaque stage, T1bN0M0B0 (Stage IB) - early T1b with ~ 10-20% BSA now with 3 mycosis fungoides tumors on the arms- new staging likely Stage IIB (T3N0M0B0).     Assessment & Plan    We discussed that these symptoms are expected given the treated disease, and we will continue supportive measures until they complete radiotherapy.    1600 cGy of planned 2400 cGy   8/12 fractions    Tumor/Palliative response: Partial response   Chemotherapy/Systemic therapy:not administered  Clinical Trial:   no    Plan for Therapy: Continue treatment as planned    Therapeutic Interventions (new in bold)  Aquaphor PRN    Toxicity  Dry skin - improving    Physical Examination    Wt Readings from Last 10 Encounters:   09/18/22 94.6 kg (208 lb 9.6 oz)   09/11/22 93.2 kg (205 lb 8 oz)   08/14/22 92.1 kg (203 lb)     BP Readings from Last 1 Encounters:   09/11/22 181/84     Pulse Readings from Last 1 Encounters:   09/11/22 55     SpO2 Readings from Last 1 Encounters:   09/11/22 98%       Alert and Orientated X 3.  No acute distress.  Peeling plaques over b/l arms    Electronically Signed:  Sunday Shams, MD  Radiation Oncology Pgy-4  [facility]  The above case utilized an oral dictation device and could contain spelling errors  09/18/22 3:22 PM

## 2022-09-18 NOTE — Unmapped (Signed)
09/18/2022    Dose Site Summary:    Subjective/Assessment/Recommendations:    1. Ataxia/balance: No issues  2. Confusion: No issues  3. Insomnia: Mild, takes  Delta H THC gummies/   4. Nausea/Vomiting: No issues  5. Nutrition: Eating well  6. Fatigue: No issues  7. Pain: Severe and Uncontrolled  8. Elimination: Takes metamucil. Mild bloating.   9. Prescription Needs: None  10. Psychosocial: Has home support  11. Other: 7/10 pain that has increased since August. No updates, would just like to know how he is progressing. Would like to know if right upper rash is being radiated as well.

## 2022-09-22 ENCOUNTER — Ambulatory Visit: Admit: 2022-09-22 | Discharge: 2022-09-23

## 2022-09-23 ENCOUNTER — Ambulatory Visit: Admit: 2022-09-23 | Discharge: 2022-09-24 | Payer: PRIVATE HEALTH INSURANCE

## 2022-09-23 DIAGNOSIS — C8400 Mycosis fungoides, unspecified site: Secondary | ICD-10-CM | POA: Diagnosis not present

## 2022-09-24 ENCOUNTER — Ambulatory Visit: Admit: 2022-09-24 | Discharge: 2022-09-25

## 2022-09-25 ENCOUNTER — Ambulatory Visit: Admit: 2022-09-25 | Discharge: 2022-09-26

## 2022-09-25 NOTE — Unmapped (Signed)
09/25/2022    Dose Site Summary:  SiteLast TxDose  Rx:Rt Arm: : 0/2,400 cGy  Rx:Lt Forearm: 09/24/2022: 2,400/2,400 cGy  RU:[id number] Rt: 09/25/2022: 2,400/2,400 cGy  Subjective/Assessment/Recommendations:    1. Fatigue: mild  2. Pain:mild takes tylenol  3. Elimination: no issues  4. Prescription Needs: none  5. Psychosocial: drives self in for appt from [place]  6. Other: given follow up info

## 2022-09-25 NOTE — Unmapped (Signed)
Radiation Oncology Treatment Management Note (OTV)    Encounter Date: 09/25/2022  Patient Name: Phillip Knox  Medical Record Number: [id number]    Diagnosis:  Mycosis Fungoides    Narrative: 76 y.o. male who presents with history of mycosis fungoides, previously plaque stage, T1bN0M0B0 (Stage IB) - early T1b with ~ 10-20% BSA now with 3 mycosis fungoides tumors on the arms- new staging likely Stage IIB (T3N0M0B0).     Assessment & Plan  Doing well at the end of his treatment today.  He has some continued irritation of the skin.  Overall the lesions are starting to respond and have reduced mildly in size.    2400 cGy of planned 2400 cGy   12/12 fractions to his right lower arm lesions and left arm lesion.  His right upper arm lesion was untreated.    Tumor/Palliative response: Partial response   Chemotherapy/Systemic therapy:not administered  Clinical Trial:   no    Plan for Therapy: Continue treatment as planned    Therapeutic Interventions (new in bold)  Aquaphor daily    Toxicity  Dry skin - improving Continue triamcinolone     Follow up: Return to clinic in 2 months. If any growth or worsening symptoms he will contact our office earlier    Physical Examination    Wt Readings from Last 10 Encounters:   09/25/22 94.3 kg (207 lb 12.8 oz)   09/18/22 94.6 kg (208 lb 9.6 oz)   09/11/22 93.2 kg (205 lb 8 oz)   08/14/22 92.1 kg (203 lb)     BP Readings from Last 1 Encounters:   09/11/22 181/84     Pulse Readings from Last 1 Encounters:   09/11/22 55     SpO2 Readings from Last 1 Encounters:   09/11/22 98%       Alert and Orientated X 3.  No acute distress.  Peeling plaques over b/l arms    Electronically Signed:  Sunday Shams, MD  Radiation Oncology Pgy-4  [facility]  The above case utilized an oral dictation device and could contain spelling errors  09/25/22 10:02 AM

## 2022-09-29 ENCOUNTER — Ambulatory Visit: Admit: 2022-09-29 | Payer: PRIVATE HEALTH INSURANCE

## 2022-09-30 NOTE — Unmapped (Signed)
I called patient to talk about the light refill and also to check on how his radiation treatments went and about his planned appt in the cancer hospital later this month. It wasn't a good time for him to talk so we made a plan to talk later this afternoon.

## 2022-09-30 NOTE — Unmapped (Signed)
Call from patient on nurse line. LVM stating the home light box is needing new orders/code now.  Wanted to notify Dr. Odis Luster of this.

## 2022-10-01 MED ORDER — MUPIROCIN 2 % TOPICAL OINTMENT
TOPICAL | 1 refills | 0.00000 days | Status: CP
Start: 2022-10-01 — End: ?

## 2022-10-01 NOTE — Unmapped (Signed)
I called and spoke with patient about a number of issues:  1) I provided him with a refill code (7269) from [employer].   2) Discussed manufacturer assistance application for Targretin. Our pharmacists here are going to f/up on that.   3) Patient concerned about possible charges for his radiation treatments. Sent message to [facility] asking that they reach out to him

## 2022-10-12 NOTE — Unmapped (Signed)
[facility] received a prescription for medication Targretin for patient.  The prescription is for benefits investigation purposes only.  Clinic has been notified of the approved copay.  [facility] will profile the prescription at this time.  We will not onboard/schedule delivery until notified by the provider/clinic to proceed.

## 2022-10-15 ENCOUNTER — Ambulatory Visit: Admit: 2022-10-15 | Discharge: 2022-10-15

## 2022-10-15 ENCOUNTER — Ambulatory Visit: Admit: 2022-10-15 | Discharge: 2022-10-15 | Attending: Dermatology | Primary: Dermatology

## 2022-10-15 DIAGNOSIS — C8400 Mycosis fungoides, unspecified site: Principal | ICD-10-CM

## 2022-10-15 DIAGNOSIS — Z85828 Personal history of other malignant neoplasm of skin: Principal | ICD-10-CM

## 2022-10-15 NOTE — Unmapped (Signed)
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 There is still some residual indurated plaque.  - Continue topical triamcinolone 0.1% ointment  - Continue following with [facility] for treatment of additional plaques  [facility] is assisting with obtaining bexarotene. Once patient receives the medication would like him to stay on it for at least 2-3 months to monitor efficacy. Discussed side effects including hypertriglyceridemia and hypothyroidism.     This patient was seen in the multidisciplinary cutaneous lymphoma clinic and the treatment plan was discussed with Dr. Elmon Kirschner (oncology) who is in agreement with the plan.     The patient was advised to call for an appointment should any new, changing, or symptomatic lesions develop.     RTC: 2 weeks, phone call with Dr. Odis Luster  _________________________________________________________________      Chief Complaint     Follow-up MF    HPI     Phillip Knox is a 77 y.o. male who presents as a returning patient to Dermatology for follow up of MF.     Today patient reports:  - Patient experienced a strong reaction to radiation treatment on the extensor surface of his R upper extremity  - He feels like he has one new spot on his R upper extremity, he is not aware of any other new spots  - These thick areas arose in the middle of Summer 2023  - These new lesions led him to start radiation treatment  - Patient [place] from [facility]    The patient denies any other new or changing lesions or areas of concern.     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     ROS: Other than symptoms mentioned in the HPI, no fevers, chills, or other skin complaints    Physical Examination     GENERAL: Well-appearing male in no acute distress, resting comfortably.  NEURO: Alert and oriented, answers questions appropriately  LYMPHATICS: No palpable cervical, axillary, or inguinal lymphadenopathy  PSYCH: Normal mood and affect  RESP: No increased work of breathing  SKIN (Full Skin Exam): Examination of the face, eyelids, lips, nose, ears, neck, chest, abdomen, back, arms, legs, hands, feet, palms, soles, nails was performed    - Indurated plaques on bilateral upper and lower extremities.  See photos in the media tab  - Erythematous plaque with focal induration on the extensor surface of his R upper extremity (site of previous radiation therapy)    (Approved Template 06/10/2020)

## 2022-10-16 NOTE — Unmapped (Signed)
IDENTIFICATION: Phillip Knox is a 77 y.o. male who presents as a referral from Marylene Land,* in consultation for recommendations about work up and management of ***.  I have reviewed the outside records and interviewed the patient and my findings are summarized below.        Assessment/Plan    Diagnosis: ***  Date of Diagnosis: ***  Stage: ***  IPI: ***  CNS Risk: ***  GELF: ***  Hep B testing: ***  Fertility counseling: ***  Regimen: ***    STAGING/WORKUP:   ***     PROGNOSIS:  ***     TREATMENT:  Targretin  F/up bowers     OTHER ISSUES:   ***    Clarene Critchley, MD  Associate Professor of Medicine  Division of Hematology and Oncology    =========================================    HISTORY OF PRESENT ILLNESS:  ***    Hematology/Oncology History   Mycosis fungoides, unspecified body region (CMS-HCC)   08/26/2022 Initial Diagnosis    Mycosis fungoides, unspecified body region (CMS-HCC)     08/26/2022 -  Radiation    Radiation Therapy Treatment Details (Noted on 08/26/2022)  Site: Right Arm  Technique: 3D CRT  Goal: No goal specified  Planned Treatment Start Date: No planned start date specified         PAST MEDICAL HISTORY:  Past Medical History:   Diagnosis Date    Basal cell carcinoma        MEDICATIONS:  Current Outpatient Medications   Medication Sig Dispense Refill    aspirin (ECOTRIN) 81 MG tablet Take 1 tablet (81 mg total) by mouth.      atenolol (TENORMIN) 25 MG tablet Take by mouth.      atorvastatin (LIPITOR) 20 MG tablet Take 1 tablet (20 mg total) by mouth.      TARGRETIN 75 mg capsule Take 2 capsules (150 mg total) by mouth daily. 60 capsule 3    alfuzosin (UROXATRAL) 10 mg 24 hr tablet Take by mouth.      clobetasol (TEMOVATE) 0.05 % ointment Apply to lymphoma lesions twice daily until smooth, then stop 120 g 5    diazePAM (VALIUM) 5 MG tablet 1 tab 30 minutes before MRI      diclofenac (VOLTAREN) 75 MG EC tablet Take 1 tablet (75 mg total) by mouth.      folic acid (FOLVITE) 1 MG tablet Take 3 pills daily on non methotrexate days 240 tablet 3    mechlorethamine 0.016 % Gel Apply a thin layer to lymphoma lesions daily until resolved 60 g 5    mechlorethamine 0.016 % Gel Apply a thin layer to lymphoma lesions daily until resolved      methotrexate 2.5 MG tablet Take 8 tablets (20 mg) on one day per week 96 tablet 1    miSOPROStol (CYTOTEC) 200 MCG tablet Take 1 tablet (200 mcg total) by mouth.      mupirocin (BACTROBAN) 2 % ointment Apply twice daily to open areas on arms until  healed (Patient not taking: Reported on 10/15/2022) 30 g 1    omega-3 fatty acids-vitamin E 1,000 mg cap by Miscellaneous route.      TARGRETIN 75 mg capsule Take 2 capsules (150 mg total) by mouth daily. (Patient not taking: Reported on 10/15/2022) 180 capsule 3    triamcinolone (KENALOG) 0.1 % ointment Apply topically two (2) times a day. To rash until resolved (Patient not taking: Reported on 10/15/2022) 454 g 2  No current facility-administered medications for this visit.       ALLERGIES:  Allergies   Allergen Reactions    Sulfa (Sulfonamide Antibiotics) Other (See Comments)    Sulfacetamide Sodium        SOCIAL HISTORY:  Social History     Tobacco Use    Smoking status: Former    Smokeless tobacco: Never   Substance and Sexual Activity    Alcohol use: Yes     Alcohol/week: 0.0 standard drinks of alcohol   Other Topics Concern    Do you use sunscreen? Yes    Tanning bed use? No    Are you easily burned? Yes    Excessive sun exposure? Yes    Blistering sunburns? Yes       FAMILY HISTORY:  Family History   Problem Relation Age of Onset    Melanoma Neg Hx     Basal cell carcinoma Neg Hx     Squamous cell carcinoma Neg Hx        REVIEW OF SYSTEMS:  See HPI. A 10 system ROS is otherwise negative.    VITAL SIGNS:   Vitals:    10/15/22 1408   BP: 162/70   Pulse: 60   Resp: 18   Temp: 37.1 ??C (98.7 ??F)   TempSrc: Temporal   SpO2: 99%   Weight: 95 kg (209 lb 7 oz)   Height: 172.7 cm (5' 8)       EXAM:  Gen: NAD, Awake, Alert  LYMPH: No cervical, supraclavicular, axillary, or inguinal LAD  RESP: Clear to auscultation bilaterally.  CV: Regular rate and rhythm. No rubs, gallops or murmurs.   GI: Soft, nontender, nondistended. No hepatosplenomegaly.   MSK: No edema.  NEURO: No focal deficits  Skin/derm: scattered patch/plaque disease; still with fullness under the radiation sittes.                            LABORATORY:  No visits with results within 1 Day(s) from this visit.   Latest known visit with results is:   Office Visit on 07/20/2022   Component Date Value Ref Range Status    Case Report 07/20/2022    Final                    Value:Surgical Pathology Report                         Case: [id number]                                 Authorizing Provider:  Marylene Land, MD Collected:           07/20/2022 1328              Ordering Location:     [facility]   Received:            07/21/2022 [phone]                                     [facility]                                                                      Pathologist:           Delmar Landau, MD                                                       Specimen:    Skin, Left arm, punch                                                                      Diagnosis 07/20/2022    Final                    Value:This result contains rich text formatting which cannot be displayed here.    Diagnosis Comment 07/20/2022    Final                    Value:This result contains rich text formatting which cannot be displayed here.    Clinical History 07/20/2022    Final                    Value:This result contains rich text formatting which cannot be displayed here.    Gross Description 07/20/2022    Final                    Value:This result contains rich text formatting which cannot be displayed here.    Microscopic Description 07/20/2022    Final Value:This result contains rich text formatting which cannot be displayed here.    Sodium 07/20/2022 143  135 - 145 mmol/L Final    Potassium 07/20/2022 4.1  3.4 - 4.8 mmol/L Final    Chloride 07/20/2022 108 (H)  98 - 107 mmol/L Final    CO2 07/20/2022 27.0  20.0 - 31.0 mmol/L Final    Anion Gap 07/20/2022 8  5 - 14 mmol/L Final    BUN 07/20/2022 14  9 - 23 mg/dL Final    Creatinine 16/06/9603 0.92  0.73 - 1.18 mg/dL Final    BUN/Creatinine Ratio 07/20/2022 15   Final    eGFR CKD-EPI (2021) Male 07/20/2022 86  >=60 mL/min/1.83m2 Final    eGFR calculated with CKD-EPI 2021 equation in accordance with [employer] and [employer] of Nephrology Task Force recommendations.    Glucose 07/20/2022 90  70 - 179 mg/dL Final    Calcium 54/05/8118 9.1  8.7 - 10.4 mg/dL Final    Albumin 14/78/2956 3.9  3.4 - 5.0 g/dL Final    Total Protein 07/20/2022 6.7  5.7 - 8.2 g/dL Final    Total Bilirubin 07/20/2022 1.4 (H)  0.3 - 1.2 mg/dL Final    AST 21/30/8657 26  <=34 U/L Final  ALT 07/20/2022 30  10 - 49 U/L Final    Alkaline Phosphatase 07/20/2022 95  46 - 116 U/L Final    Free T4 07/20/2022 1.16  0.89 - 1.76 ng/dL Final    TSH 16/06/9603 0.777  0.550 - 4.780 uIU/mL Final    Triglycerides 07/20/2022 248 (H)  0 - 150 mg/dL Final    Cholesterol 54/05/8118 156  <=200 mg/dL Final    WBC 14/78/2956 8.0  3.6 - 11.2 10*9/L Final    RBC 07/20/2022 4.65  4.26 - 5.60 10*12/L Final    HGB 07/20/2022 15.7  12.9 - 16.5 g/dL Final    HCT 21/30/8657 44.2  39.0 - 48.0 % Final    MCV 07/20/2022 95.1  77.6 - 95.7 fL Final    MCH 07/20/2022 33.7 (H)  25.9 - 32.4 pg Final    MCHC 07/20/2022 35.4  32.0 - 36.0 g/dL Final    RDW 84/69/6295 13.4  12.2 - 15.2 % Final    MPV 07/20/2022 9.0  6.8 - 10.7 fL Final    Platelet 07/20/2022 176  150 - 450 10*9/L Final    Neutrophils % 07/20/2022 60.5  % Final    Lymphocytes % 07/20/2022 24.7  % Final    Monocytes % 07/20/2022 10.9  % Final    Eosinophils % 07/20/2022 2.9  % Final    Basophils % 07/20/2022 1.0  % Final    Absolute Neutrophils 07/20/2022 4.9  1.8 - 7.8 10*9/L Final    Absolute Lymphocytes 07/20/2022 2.0  1.1 - 3.6 10*9/L Final    Absolute Monocytes 07/20/2022 0.9 (H)  0.3 - 0.8 10*9/L Final    Absolute Eosinophils 07/20/2022 0.2  0.0 - 0.5 10*9/L Final    Absolute Basophils 07/20/2022 0.1  0.0 - 0.1 10*9/L Final    Diagnosis 07/20/2022    Final                    Value:This result contains rich text formatting which cannot be displayed here.    Clinical History 07/20/2022    Final                    Value:This result contains rich text formatting which cannot be displayed here.    Gross Description 07/20/2022    Final                    Value:This result contains rich text formatting which cannot be displayed here.    Microscopic Description 07/20/2022    Final                    Value:This result contains rich text formatting which cannot be displayed here.    Disclaimer 07/20/2022    Final                    Value:This result contains rich text formatting which cannot be displayed here.    Flow Cytometry Summary 07/20/2022    Final                    Value:Flow Cytometric Immunophenotyping Results:    Peripheral blood  Heme WBC  8,000 cells/uL     Flow Differential (% of Total)  Viability:   89%  Total of Markers Charged   18  Markers-1 Charged  17    Gated Population:  20% Lymphocytes     Description of Gated Cells (% of Gated)  B-Cell Markers:    CD19   9%  CD20   13%  Kappa   5%  Lambda            4%    T-Cell Markers:    CD2   85%  sCD3   73%  sCD3/CD4  46%  Total CD4  47%  CD5   64%  CD7   82%  sCD3/CD8  23%  Total CD8  24%    Myeloid Markers:    CD117         <1%    Miscellaneous:    CD10    1%  CD34    <1%  CD38    39%  CD45    100%  CD56    22%  CD57    32%  CD3-/CD16+56  16%  Total CD16+56  30%    Dual Staining:   CD4+/CD7-  7%    Flow Cytometry Interpretation:  Flow cytometric analysis of the peripheral blood reveals a normal white blood cell count (8,000 cells/uL).    Several populations are analyzed.    The first population of 20% cells is gated on the lymphocyte region and is composed of 73% T-cells (CD4: CD8 ratio is                           2:1) without an aberrant immunophenotype, 9% polytypic B-cells, and 16% NK-cells.    Separate analysis of the monocytic cell region (data chart not shown) shows no aberrant expression of CD56.     Lastly, a discrete immature population is not identified; CD34-positive cells represent <0.1% of total cells analyzed.    Flow cytometric analysis fails to reveal a monotypic B-cell, aberrant T-cell, expanded large granular lymphocyte, or expanded CD34-positive blast population.      This test, utilizing analyte-specific reagents (ASR) was developed and its performance characteristics determined by the [facility].  It has not been cleared or approved by the U.S. Food and Drug Administration (FDA).  The FDA has determined that such clearance or approval is not necessary.  This test is used for clinical purposes.  It should not be regarded as investigational or for research. CD4  47%  CD5   64%  CD7   82%  sCD3/CD8  23%  Total CD8  24%    Myeloid Markers:    CD117         <1%    Miscellaneous:    CD10    1%  CD34    <1%  CD38    39%  CD45    100%  CD56    22%  CD57    32%  CD3-/CD16+56  16%  Total CD16+56  30%    Dual Staining:   CD4+/CD7-  7%    Flow Cytometry Interpretation:  Flow cytometric analysis of the peripheral blood reveals a normal white blood cell count (8,000 cells/uL).    Several populations are analyzed.    The first population of 20% cells is gated on the lymphocyte region and is composed of 73% T-cells (CD4: CD8 ratio is                           2:1) without an aberrant immunophenotype, 9% polytypic B-cells, and 16% NK-cells.    Separate analysis of the monocytic cell region (data chart not shown) shows no aberrant expression of CD56.  Lastly, a discrete immature population is not identified; CD34-positive cells represent <0.1% of total cells analyzed.    Flow cytometric analysis fails to reveal a monotypic B-cell, aberrant T-cell, expanded large granular lymphocyte, or expanded CD34-positive blast population.      This test, utilizing analyte-specific reagents (ASR) was developed and its performance characteristics determined by the [facility].  It has not been cleared or approved by the U.S. Food and Drug Administration (FDA).  The FDA has determined that such clearance or approval is not necessary.  This test is used for clinical purposes.  It should not be regarded as investigational or for research.

## 2022-11-26 ENCOUNTER — Ambulatory Visit
Admit: 2022-11-26 | Discharge: 2022-11-26 | Payer: MEDICARE | Attending: Student in an Organized Health Care Education/Training Program | Primary: Student in an Organized Health Care Education/Training Program

## 2022-11-26 ENCOUNTER — Ambulatory Visit: Admit: 2022-11-26 | Discharge: 2022-11-27 | Payer: MEDICARE | Attending: Dermatology | Primary: Dermatology

## 2022-11-26 NOTE — Unmapped (Incomplete)
Radiation Oncology Follow-up Note    Patient Name: Phillip Knox  Date of Service: 11/26/2022     Interval since RT    RT Plan:  R arm plaque:   200 cGy x 12 = 2400 cGy treated daily  Photons  1 cm bolus  RAO/LPO    L arm plaque:  200 cGy x 12 = 2400 cGy treated daily  En face electrons 6 MeV  9 cm diameter circular library block  1 cm bolus  Rx to 90% IDL      2 months (09/25/2022)    Assessment    Phillip Knox is a 77 y.o. male who returns today in follow-up with history of mycosis fungoides, previously plaque stage, T1bN0M0B0 (Stage IB) - early T1b with ~ 10-20% BSA now with 3 mycosis fungoides tumors on the arms- new staging likely Stage IIB (T3N0M0B0).     ***    Plan    - ***    --    Narrative    Phillip Knox is a 77 y.o. male who initially presented to medical attention with ***    Today in clinic, ***    Medications    The patient has a current medication list which includes the following prescription(s): aspirin, atenolol, atorvastatin, clobetasol, mupirocin, targretin, targretin, and triamcinolone.    Physical Examination    Wt Readings from Last 1 Encounters:   10/15/22 95 kg (209 lb 7 oz)     Temp Readings from Last 1 Encounters:   10/15/22 37.1 ??C (98.7 ??F) (Temporal)     BP Readings from Last 1 Encounters:   10/15/22 162/70     Pulse Readings from Last 1 Encounters:   10/15/22 60     SpO2 Readings from Last 1 Encounters:   10/15/22 99%      General:  The patient is sitting comfortably in no acute distress.  HEENT:  Normocephalic, atraumatic  Lymph:  No palpable lymphadenopathy   CV:  Regular rate and rhythm  Resp:  Good inspiratory effort  Abd:  Soft, non-distended  Back:  No bony tenderness  Ext:  No clubbing, cyanosis, or edema  Neuro:  CN II-XII grossly intact and symmetric, no focal neurologic deficits  Skin:  No skin lesions were noted  ***    ECOG Performance Status: {ecogps:28295::0 = Fully active, able to carry on all pre-disease performance without restriction}    Imaging and Pathology: As documented in the HPI.    Diagnosis    No primary diagnosis found.

## 2022-11-26 NOTE — Unmapped (Unsigned)
11/26/2022    Rx:NOT TREA: : 0/0 cGy  Rx:Lt Forearm: 09/24/2022: 2,400/2,400 cGy  UE:[id number] Rt: 09/25/2022: 2,400/2,400 cGy      Subjective/Assessment/Recommendations:    1. Fatigue: mild  2. Pain: no arm pain, back pain 6/10  3. Elimination: no issues  4. Prescription none   5. Psychosocial: has help in the home    Wt Readings from Last 3 Encounters:   10/15/22 95 kg (209 lb 7 oz)   09/25/22 94.3 kg (207 lb 12.8 oz)   09/18/22 94.6 kg (208 lb 9.6 oz)      Wt Readings from Last 3 Encounters:   10/15/22 95 kg (209 lb 7 oz)   09/25/22 94.3 kg (207 lb 12.8 oz)   09/18/22 94.6 kg (208 lb 9.6 oz)     Temp Readings from Last 3 Encounters:   10/15/22 37.1 ??C (98.7 ??F) (Temporal)   09/18/22 36.3 ??C (97.3 ??F) (Temporal)   09/11/22 36.1 ??C (97 ??F) (Temporal)     BP Readings from Last 3 Encounters:   10/15/22 162/70   09/11/22 181/84     Pulse Readings from Last 3 Encounters:   10/15/22 60   09/11/22 55

## 2022-12-02 NOTE — Unmapped (Signed)
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 He has f/up with rad onc later today and plans to discuss whether to pursue radiation to additional lesion on R arm and R leg, although again patient feels that these have improved so he may choose to hold off on more radiation  - Would still like to start a systemic therapy in hopes this will hold off more tumor development. Manufacturer assistance application to [facility] for Targretin was submitted more than a month ago and we are still waiting on decision. If not approved, may plan trial of acitretin or even methotrexate (although this was not helpful in the past)  - For now, continue topical triamcinolone 0.1% ointment  - Patient may resume home phototherapy if he feels up to it but he has held treatments recently because of back problems    The patient was advised to call for an appointment should any new, changing, or symptomatic lesions develop.     RTC: April  _________________________________________________________________      Chief Complaint     Follow-up MF    HPI     Phillip Knox. Capell is a 77 y.o. male who presents as a returning patient to Dermatology for follow up of MF. He still has not received Targretin but reports good healing from recent radiation. He thinks some thicker areas on calf and R arm have shrunk some. He is using triamcinolone but not currently doing light therapy  The patient denies any other new or changing lesions or areas of concern.     Pertinent Past Medical History   Reviewed    Family History:   Negative for melanoma    Past Medical History, Family History, Social History, Medication List, Allergies, and Problem List were reviewed in the rooming section of Epic.     ROS: Other than symptoms mentioned in the HPI, no fevers, chills, or other skin complaints    Physical Examination     GENERAL: Well-appearing male in no acute distress, resting comfortably.  NEURO: Alert and oriented, answers questions appropriately  LYMPHATICS: No palpable cervical, axillary, or inguinal lymphadenopathy  PSYCH: Normal mood and affect  RESP: No increased work of breathing  SKIN (Full Skin Exam): Examination of the face, eyelids, lips, nose, ears, neck, chest, abdomen, back, arms, legs, hands, feet, palms, soles, nails was performed    - Indurated plaques on extremities, most notably R upper arm and R calf but improved from prior    All areas not specifically commented on are within normal limits.     (Approved Template 06/10/2020)

## 2023-01-04 ENCOUNTER — Ambulatory Visit: Admit: 2023-01-04 | Discharge: 2023-01-05 | Payer: MEDICARE | Attending: Dermatology | Primary: Dermatology

## 2023-01-04 DIAGNOSIS — C8400 Mycosis fungoides, unspecified site: Principal | ICD-10-CM

## 2023-01-04 DIAGNOSIS — Z85828 Personal history of other malignant neoplasm of skin: Principal | ICD-10-CM

## 2023-01-04 DIAGNOSIS — Z79899 Other long term (current) drug therapy: Principal | ICD-10-CM

## 2023-01-04 MED ORDER — ACITRETIN 25 MG CAPSULE
ORAL_CAPSULE | ORAL | 0 refills | 84.00000 days | Status: CP
Start: 2023-01-04 — End: ?

## 2023-01-04 MED ADMIN — triamcinolone acetonide (KENALOG-40) injection 40 mg: 40 mg | @ 19:00:00 | Stop: 2023-01-04

## 2023-01-04 NOTE — Unmapped (Signed)
It was nice to see you today! Your resident physician was Dr. Chanan Detwiler    If any of your medications are too expensive, look for a coupon at GoodRx.com or reference the application on a smartphone  - Enter the medication name, size, and your zip code to find coupons for local pharmacies.   - Print a coupon and bring it to the pharmacy, or pull up the coupon on a smartphone.  - You can also call pharmacies to ask about the cost of your medication before you pick it up.  If you still cannot afford your medication, please let us know.     Please call our clinic at [phone] with any concerns or to schedule a follow up appointment. We look forward to seeing you again!    [facility] releases most results to you as soon as they are available. Therefore, you may see some results before we do. Please give us 2 business days to review the tests and contact you by phone or through MyChart. If you are concerned that some results may be upsetting or confusing, you may wish to wait until we contact you before looking at the report in MyChart. If you have an urgent question, you can send us a message or call our clinic. Otherwise, we prefer that you wait 2 business days for us to contact you.

## 2023-01-04 NOTE — Unmapped (Signed)
PA INITIATED FOR Acitretin 25MG  capsules Key: [id number]  PA Case ID #: [id number]  Rx #: [id number]  SENT TO PLAN TODAY

## 2023-01-04 NOTE — Unmapped (Signed)
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   - Rinvoq was cost-prohibitive.  - Joint decision made to start acitretin (SORIATANE) 25 MG capsule; Take 1 capsule (25 mg total) by mouth Every Monday, Wednesday, and Friday. Take with food. Risks and benefits discussed.  - Joint decision made to treat nodule on R leg with kenalog. See procedure note below.  - Start triamcinolone ointment BID PRN on the affected areas on the legs and arms.  - If not improved with the above measures, patient was advised to contact the clinic for further evaluation and treatment. He may benefit from radiation to the lesions on the legs.    Intralesional Kenalog Procedure Note: After the patient was informed of risks (including atrophy and dyspigmentation), benefits and side effects of intralesional steroid injection, the patient elected to undergo injection and verbal consent was obtained. Skin was cleaned with alcohol and injected intralesionally into the sites (below). The patient tolerated the procedure well without complications and was instructed on post-procedure care.  Location(s): R medial knee  Number of sites treated: 1  Kenalog (triamcinolone) Concentration: 40 mg/ml   Volume: 0.7 ml total    High risk medication use (acitretin initiation)  - Reviewed baseline labs (thyroid, triglycerides, cholesterol, CMP, CBC) from October.     The patient was advised to call for an appointment should any new, changing, or symptomatic lesions develop.     RTC: Return in about 3 months (around 04/05/2023) for MF. or sooner as needed   _________________________________________________________________      Chief Complaint     Chief Complaint   Patient presents with    MYCOSIS FUNGOIDS     RIGHT LEG AND RIGHT ARM 2 SMALL SPOTS       HPI     Phillip Knox. Castaldo is a 77 y.o. male who presents as a returning patient (last seen 11/26/2022) to Dermatology for follow up of MF. Areas on arms treated w/ radiation since last visit improved substantially. Some completely resolved, but there are still a few residual thin plaques.    Has a new nodule on the R medial knee and plaques on the right lower leg. The nodule is sometimes itchy, but it is not painful.    Patient has had issues getting targretin. [facility] Patient Assistance Program denied assistance because: Patient has available prescription coverage for prescribed product. He would like another systemic medicine to take instead. Patient prefers to avoid radiation if possible.    The patient denies any other new or changing lesions or areas of concern.     Pertinent Past Medical History     History of skin cancer as outlined below:    Problem List          Other    History of nonmelanoma skin cancer - Primary     sBCC of the R upper chest s/p excision 01/2015  BCC R upper back 08/2011  BCC chest s/p excision  12/2017  Hypertrophic scar on chest previously treated w/ ILK          Past Medical History, Family History, Social History, Medication List, Allergies, and Problem List were reviewed in the rooming section of Epic.     ROS: Other than symptoms mentioned in the HPI, no fevers, chills, or other skin complaints    Physical Examination     GENERAL: Well-appearing male in no acute distress, resting comfortably.  NEURO: Alert and oriented, answers questions appropriately  LYMPHATICS: No palpable cervical or clavicular lymphadenopathy  PSYCH: Normal mood and affect  RESP: No increased work of breathing  SKIN: Examination of the face, bilateral upper extremities, and bilateral lower extremities was performed    - resolving patches on bilateral arms  - pink thin plaque on R upper arm  - pink crusted nodule on medial R knee and surrounding erythematous patches & thin plaques  - scaly pink patch on R lateral upper calf            All areas not commented on are within normal limits or unremarkable      (Approved Template 06/10/2020)

## 2023-01-05 NOTE — Unmapped (Signed)
PA APPROVAL FOR Acitretin 25MG  capsules Key: [id number]

## 2023-01-05 NOTE — Unmapped (Signed)
I saw and evaluated the patient, participating in the key elements of the service.  I discussed the findings, assessment and plan with the Resident and agree with the Resident’s findings and plan as documented in the Resident’s note.  I was present for the entirety of procedures taking less than 5 minutes and was present for the key and critical portions and immediately available for the entirety of procedure(s) taking 5 or more minutes.     Kaamil Morefield V Ronesha Heenan, MD PhD

## 2023-01-14 NOTE — Unmapped (Addendum)
PA approval letter for acitretin scanned to media tab.  Patient already previously notified.

## 2023-03-22 IMAGING — MR MR CERVICAL SPINE W/O CM
4 of 5 series · 28 of 48 positions shown · non-contrast
Comparison: Remote study from 2929

CLINICAL DATA: Cervical radiculopathy; technologist note states
pain and weakness with numbness in arms

EXAM:
MRI CERVICAL SPINE WITHOUT CONTRAST
TECHNIQUE: Multiplanar, multisequence MR imaging of the cervical spine was
performed. No intravenous contrast was administered.

[Series 3: T2 · sagittal · 3.0mm · 0.66mm/px · 6 of 15 slices shown (1 of 2)]
[im 1/15]
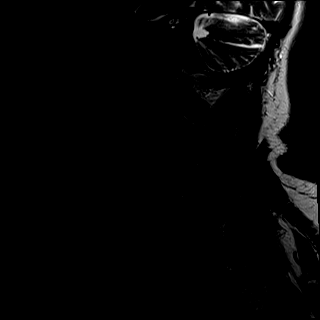
[im 3/15]
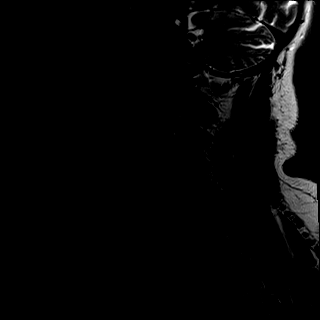
[im 6/15]
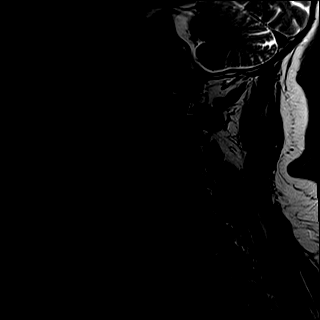
[im 9/15]
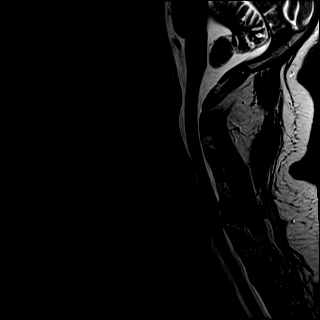
[im 12/15]
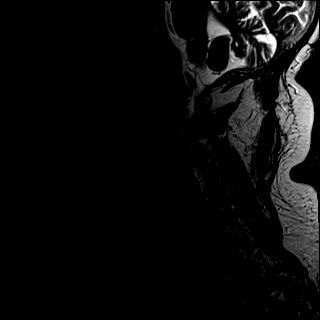
[im 15/15]
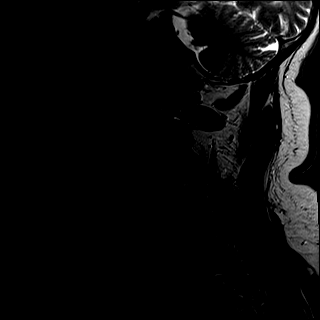

[Series 4: T1 · sagittal · 3.0mm · 0.41mm/px · 7 of 15 slices shown]
[im 1/15]
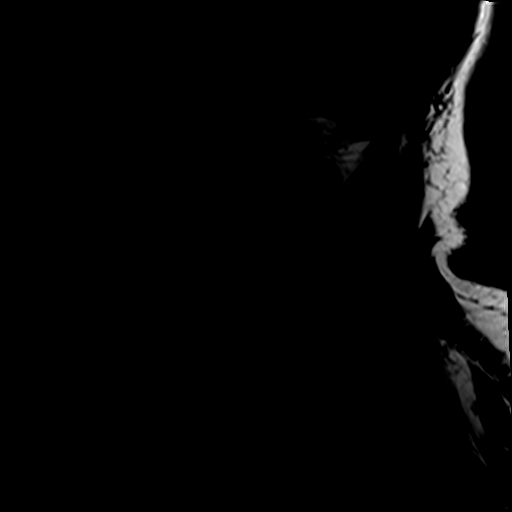
[im 3/15]
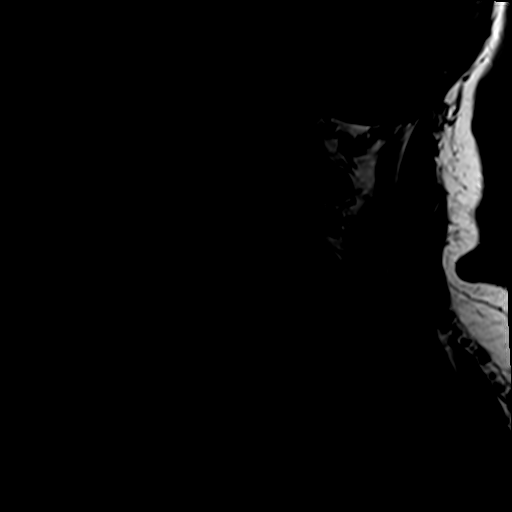
[im 5/15]
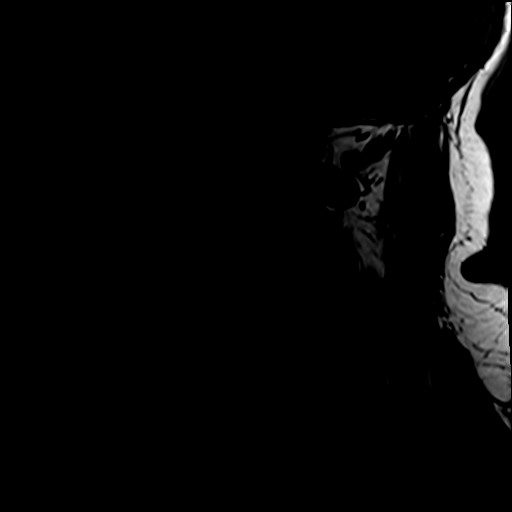
[im 8/15]
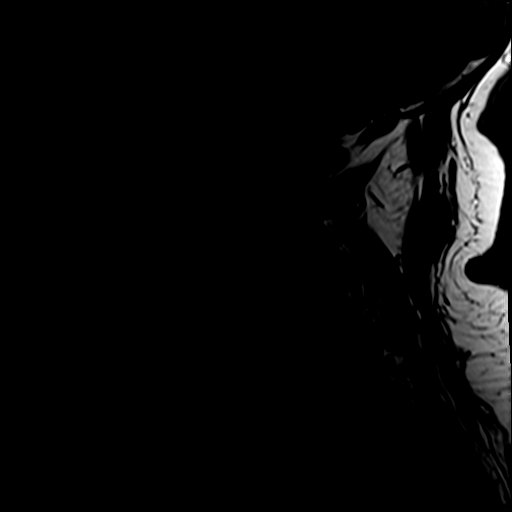
[im 10/15]
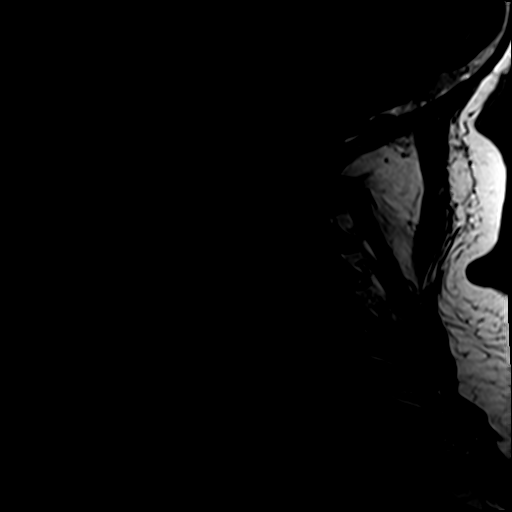
[im 12/15]
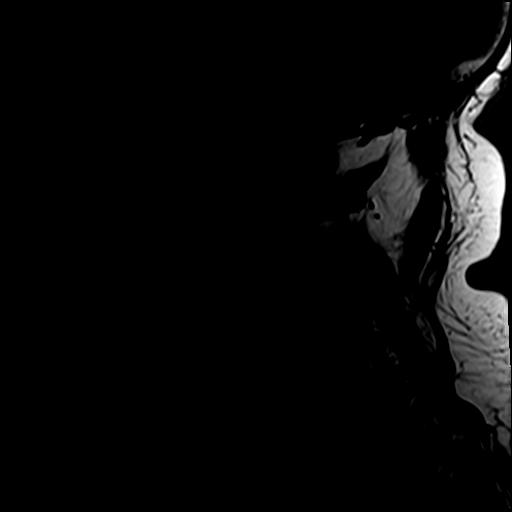
[im 15/15]
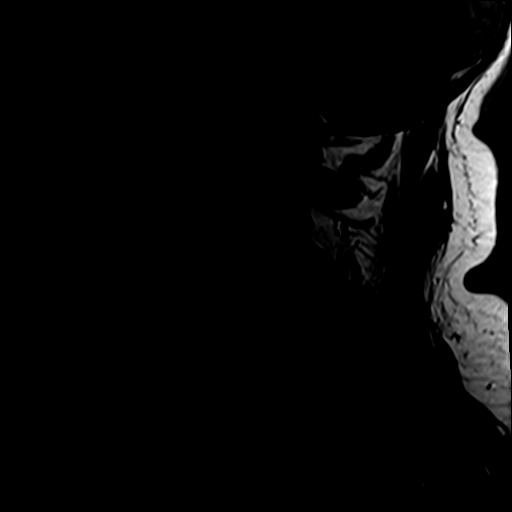

[Series 5: tir sag · sagittal · 3.0mm · 0.41mm/px · 7 of 15 slices shown]
[im 1/15]
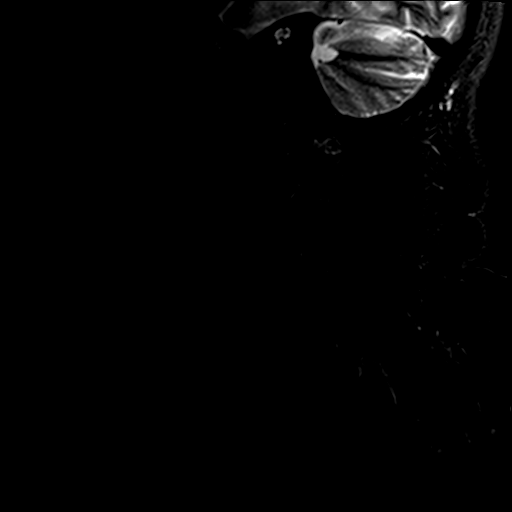
[im 3/15]
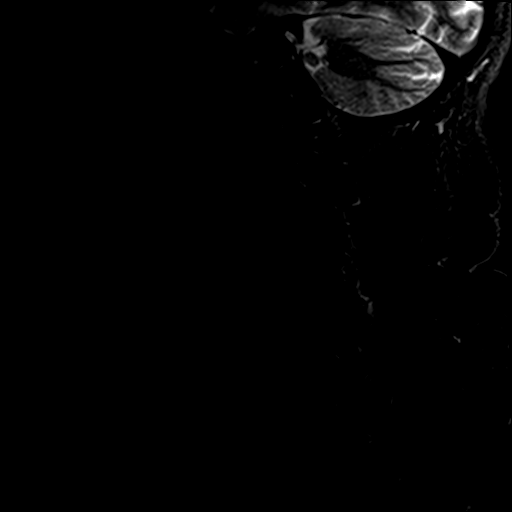
[im 5/15]
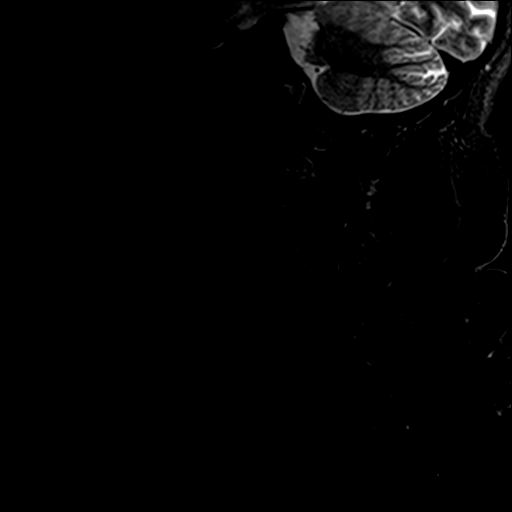
[im 8/15]
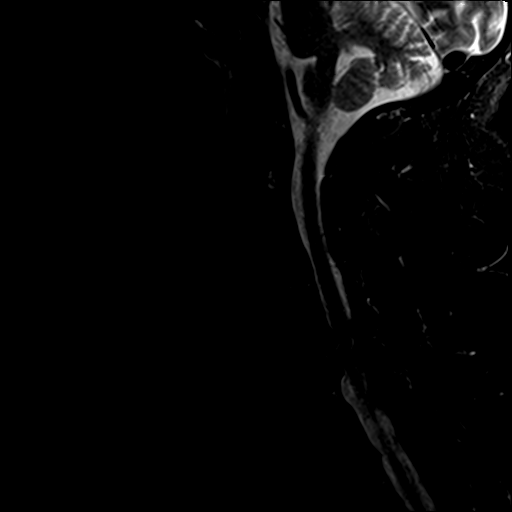
[im 10/15]
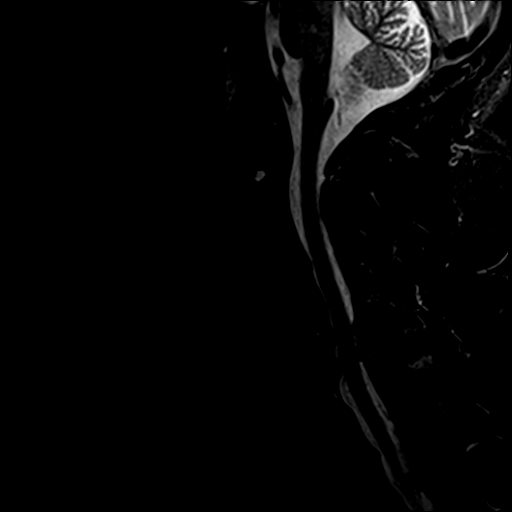
[im 12/15]
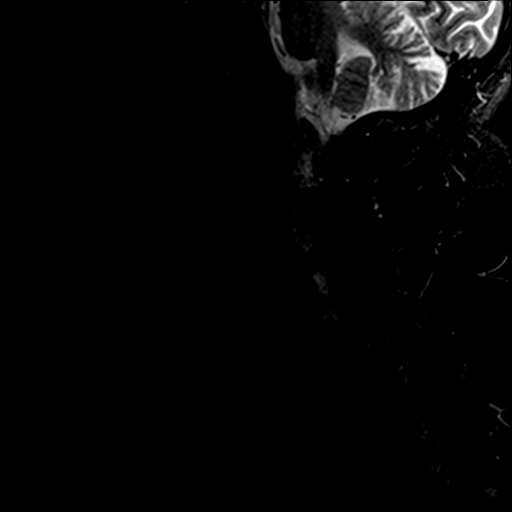
[im 15/15]
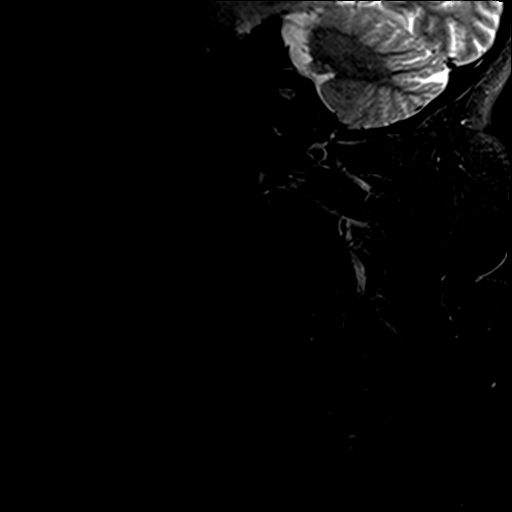

[Series 7: T2 · axial · 3.0mm · 0.70mm/px · z∈[-62,+46]mm · 8 of 32 slices shown (2 of 2)]
[im 1/32]
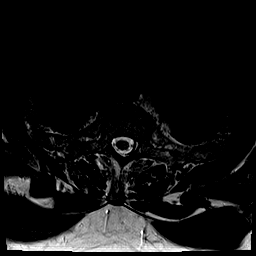
[im 5/32]
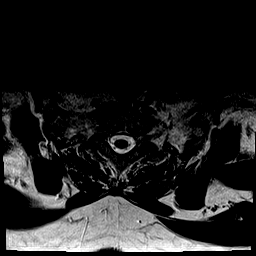
[im 10/32]
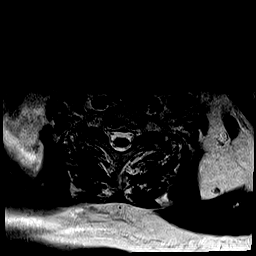
[im 15/32]
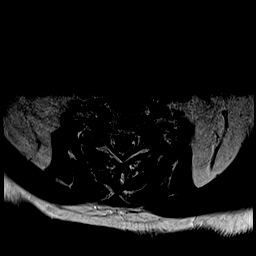
[im 17/32]
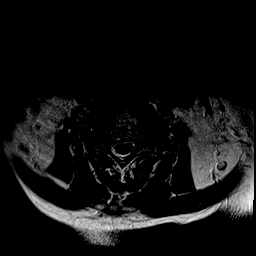
[im 22/32]
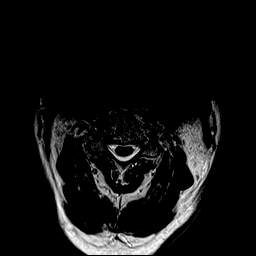
[im 27/32]
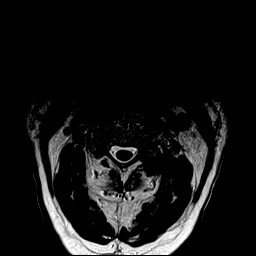
[im 32/32]
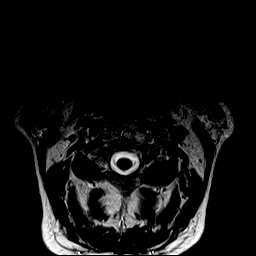

[28 of 48 positions shown; findings below may reference images not displayed]

FINDINGS: Alignment: Mild retrolisthesis at C5-C6 and C6-C7.

Vertebrae: There is fusion of the C4 and C5 vertebral bodies and
right facets. Vertebral body heights are maintained apart from
degenerative endplate irregularity. No substantial marrow edema. No
suspicious osseous lesion.

Cord: No abnormal signal.

Posterior Fossa, vertebral arteries, paraspinal tissues:
Unremarkable.

Disc levels:

C2-C3: Left greater than right facet and uncovertebral hypertrophy.
No canal stenosis. Mild left foraminal stenosis.

C3-C4: Disc bulge with endplate osteophytes. Facet and uncovertebral
hypertrophy. Minor canal stenosis. Mild right and moderate left
foraminal stenosis.

C4-C5: Bridging bone across the disc space partially effaces ventral
subarachnoid space. Right greater than left uncovertebral
hypertrophy. Moderate to marked foraminal stenosis.

C5-C6: Disc bulge with endplate osteophytes. Uncovertebral
hypertrophy. Thickening of the ligamentum flavum. Marked canal
stenosis. Marked foraminal stenosis.

C6-C7: Disc bulge with endplate osteophytes. Uncovertebral
hypertrophy. Ligamentum flavum thickening. Moderate canal stenosis.
Marked foraminal stenosis.

C7-T1: Left greater than right facet hypertrophy. No canal stenosis.
Minor left foraminal stenosis.

Right foraminal narrowing at T1-T2.
IMPRESSION: Multilevel degenerative changes as detailed above with suboptimal
stenosis evaluation due to motion artifact. Canal stenosis is
greatest at C5-C6. Foraminal narrowing is greatest from C4-C5 to
C6-C7.

## 2023-03-24 NOTE — Unmapped (Signed)
Specialty Medication(s): Targretin    Mr.Bragdon has been dis-enrolled from the [facility] specialty pharmacy services due to  cost prohibitive .    Additional information provided to the patient:      Rocklyn Mayberry Vangie Bicker, PharmD  [facility] Specialty Pharmacist

## 2023-04-05 ENCOUNTER — Ambulatory Visit: Admit: 2023-04-05 | Discharge: 2023-04-06 | Payer: MEDICARE | Attending: Dermatology | Primary: Dermatology

## 2023-04-05 DIAGNOSIS — L814 Other melanin hyperpigmentation: Principal | ICD-10-CM

## 2023-04-05 DIAGNOSIS — L821 Other seborrheic keratosis: Principal | ICD-10-CM

## 2023-04-05 DIAGNOSIS — L578 Other skin changes due to chronic exposure to nonionizing radiation: Principal | ICD-10-CM

## 2023-04-05 DIAGNOSIS — D229 Melanocytic nevi, unspecified: Principal | ICD-10-CM

## 2023-04-05 DIAGNOSIS — Z85828 Personal history of other malignant neoplasm of skin: Principal | ICD-10-CM

## 2023-04-05 DIAGNOSIS — C8400 Mycosis fungoides, unspecified site: Principal | ICD-10-CM

## 2023-04-05 DIAGNOSIS — D1801 Hemangioma of skin and subcutaneous tissue: Principal | ICD-10-CM

## 2023-04-26 ENCOUNTER — Other Ambulatory Visit: Payer: Self-pay | Admitting: Gastroenterology

## 2023-04-26 DIAGNOSIS — K219 Gastro-esophageal reflux disease without esophagitis: Secondary | ICD-10-CM

## 2023-04-26 DIAGNOSIS — R1310 Dysphagia, unspecified: Secondary | ICD-10-CM

## 2023-04-30 ENCOUNTER — Ambulatory Visit
Admission: RE | Admit: 2023-04-30 | Discharge: 2023-04-30 | Disposition: A | Discharge status: Home / Self Care | Payer: Medicare HMO | Source: Ambulatory Visit | Attending: Gastroenterology

## 2023-04-30 DIAGNOSIS — R1310 Dysphagia, unspecified: Secondary | ICD-10-CM | POA: Diagnosis present

## 2023-04-30 DIAGNOSIS — K219 Gastro-esophageal reflux disease without esophagitis: Secondary | ICD-10-CM | POA: Diagnosis present

## 2023-05-07 ENCOUNTER — Emergency Department
Admission: EM | Admit: 2023-05-07 | Discharge: 2023-05-07 | Disposition: A | Discharge status: Home / Self Care | Payer: Medicare HMO | Attending: Emergency Medicine | Admitting: Emergency Medicine

## 2023-05-07 ENCOUNTER — Other Ambulatory Visit: Payer: Self-pay

## 2023-05-07 ENCOUNTER — Emergency Department: Payer: Medicare HMO

## 2023-05-07 DIAGNOSIS — R519 Headache, unspecified: Secondary | ICD-10-CM

## 2023-05-07 DIAGNOSIS — I160 Hypertensive urgency: Secondary | ICD-10-CM | POA: Insufficient documentation

## 2023-05-07 DIAGNOSIS — I2510 Atherosclerotic heart disease of native coronary artery without angina pectoris: Secondary | ICD-10-CM | POA: Insufficient documentation

## 2023-05-07 DIAGNOSIS — I10 Essential (primary) hypertension: Secondary | ICD-10-CM | POA: Diagnosis not present

## 2023-05-07 LAB — BASIC METABOLIC PANEL
Anion gap: 8 (ref 5–15)
BUN: 17 mg/dL (ref 8–23)
CO2: 25 mmol/L (ref 22–32)
Calcium: 9.1 mg/dL (ref 8.9–10.3)
Chloride: 104 mmol/L (ref 98–111)
Creatinine, Ser: 0.93 mg/dL (ref 0.61–1.24)
GFR, Estimated: >60 mL/min (ref >60)
Glucose, Bld: 120 mg/dL — ABNORMAL HIGH (ref 70–99)
Potassium: 3.7 mmol/L (ref 3.5–5.1)
Sodium: 137 mmol/L (ref 135–145)

## 2023-05-07 LAB — CBC
HCT: 45.5 % (ref 39.0–52.0)
Hemoglobin: 15.5 g/dL (ref 13.0–17.0)
MCH: 33.8 pg (ref 26.0–34.0)
MCHC: 34.1 g/dL (ref 30.0–36.0)
MCV: 99.3 fL (ref 80.0–100.0)
Platelets: 174 10*3/uL (ref 150–400)
RBC: 4.58 MIL/uL (ref 4.22–5.81)
RDW: 12.6 % (ref 11.5–15.5)
WBC: 7 10*3/uL (ref 4.0–10.5)
nRBC: 0 % (ref 0.0–0.2)

## 2023-05-07 LAB — TROPONIN I (HIGH SENSITIVITY): Troponin I (High Sensitivity): 8 ng/L (ref <18)

## 2023-05-07 NOTE — ED Triage Notes (Signed)
Pt comes with c/o HTN since Monday. Pt states he went to [facility] to get checked out and was brought here. Pt states hx of HTN and is on med. Pt was advised increased to dose but not helping. Pt states dizziness and headache too.

## 2023-05-07 NOTE — ED Notes (Signed)
Pt returned from CT. Pt placed back on monitor.

## 2023-05-07 NOTE — Discharge Instructions (Signed)
Follow-up with your cardiologist for further evaluation of your blood pressure.  Continue take your medications as directed.  Return to the ER for new or worsening symptoms.

## 2023-05-07 NOTE — ED Notes (Signed)
IV removed. Cath in tact. Dressing applied.

## 2023-05-07 NOTE — ED Provider Notes (Signed)
[facility] Provider Note    Event Date/Time   First MD Initiated Contact with Patient 05/07/23 1242     (approximate)   History   Hypertension   HPI  HARSHAAN Cook is a 77 y.o. male with history of CAD with prior stents, hypertension presenting to the emergency department for evaluation of elevated blood pressure.  On Monday, patient noticed that he had a systolic blood pressure of 215.  He called his cardiology office and had medication adjustments made.  He reports he has been taking this, but is noticed continued intermittent high blood pressure.  He has also noticed some intermittent headaches, not sudden in onset with associated lightheadedness without syncope.  No vertigo or double vision.  Today, his blood pressure was once again above 200 systolic, so he presented to the ER.  Denies chest pain or shortness of breath.     Physical Exam   Triage Vital Signs: ED Triage Vitals  Encounter Vitals Group     BP 05/07/23 1241 (!) 166/69     Systolic BP Percentile --      Diastolic BP Percentile --      Pulse Rate 05/07/23 1241 (!) 55     Resp 05/07/23 1241 18     Temp 05/07/23 1241 98.5 F (36.9 C)     Temp src --      SpO2 05/07/23 1241 97 %     Weight --      Height --      Head Circumference --      Peak Flow --      Pain Score 05/07/23 1240 6     Pain Loc --      Pain Education --      Exclude from Growth Chart --     Most recent vital signs: Vitals:   05/07/23 1530 05/07/23 1534  BP: (!) 153/61 (!) 160/67  Pulse: (!) 47 (!) 48  Resp:  18  Temp:  97.7 F (36.5 C)  SpO2: 99% 99%     General: Awake, interactive  CV:  Regular rate, good peripheral perfusion.  Resp:  Lungs clear, unlabored respirations.  Abd:  Soft, nondistended.  Neuro:  Symmetric facial movement, fluid speech, 5 out of 5 strength of the bilateral upper and lower extremities with normal sensation, normal finger-nose testing, normal extraocular movements   ED  Results / Procedures / Treatments   Labs (all labs ordered are listed, but only abnormal results are displayed) Labs Reviewed  BASIC METABOLIC PANEL - Abnormal; Notable for the following components:      Result Value   Glucose, Bld 120 (*)    All other components within normal limits  CBC  TROPONIN I (HIGH SENSITIVITY)     EKG EKG independently reviewed interpreted by myself (ER attending) demonstrates:  EKG demonstrates sinus rhythm at a rate of 52, PR 162, QRS 100, QTc 378, no acute ST changes  RADIOLOGY Imaging independently reviewed and interpreted by myself demonstrates:  CT head without acute bleed on my review  PROCEDURES:  Critical Care performed: No  Procedures   MEDICATIONS ORDERED IN ED: Medications - No data to display   IMPRESSION / MDM / ASSESSMENT AND PLAN / ED COURSE  I reviewed the triage vital signs and the nursing notes.  Differential diagnosis includes, but is not limited to, hypertensive urgency, consideration for hypertensive emergency including intracranial bleed, cardiac strain  Patient's presentation is most consistent with acute presentation with potential threat  to life or bodily function.  77 year old male presenting to the emergency department for elevated blood pressure.  On presentation here, patient with significantly improved blood pressures ranging from systolics of 130s to 160s.  Does have some bradycardia that I suspect is related to his increased dose of atenolol.  His neurologic exam is reassuring.  Labs without severe derangement including normal troponin.  CT head reassuring.  Patient discharged in stable condition with strict return precautions and plans for continued outpatient follow-up.     FINAL CLINICAL IMPRESSION(S) / ED DIAGNOSES   Final diagnoses:  Hypertensive urgency  Acute nonintractable headache, unspecified headache type     Rx / DC Orders   ED Discharge Orders     None        Note:  This document was  prepared using Dragon voice recognition software and may include unintentional dictation errors.   Trinna Post, MD 05/07/23 [phone]

## 2023-05-07 NOTE — ED Notes (Signed)
Patient transported to CT 

## 2023-05-28 ENCOUNTER — Ambulatory Visit: Admit: 2023-05-28 | Discharge: 2023-05-29 | Payer: MEDICARE | Attending: Dermatology | Primary: Dermatology

## 2023-05-28 DIAGNOSIS — C8400 Mycosis fungoides, unspecified site: Principal | ICD-10-CM

## 2023-05-28 MED ORDER — ACITRETIN 25 MG CAPSULE
ORAL_CAPSULE | Freq: Every day | ORAL | 5 refills | 30.00000 days | Status: CP
Start: 2023-05-28 — End: ?

## 2023-07-02 ENCOUNTER — Ambulatory Visit: Admit: 2023-07-02 | Discharge: 2023-07-03 | Attending: Dermatology | Primary: Dermatology

## 2023-07-02 DIAGNOSIS — C8400 Mycosis fungoides, unspecified site: Principal | ICD-10-CM

## 2023-07-02 DIAGNOSIS — Z79899 Other long term (current) drug therapy: Principal | ICD-10-CM

## 2023-07-02 MED ORDER — FLUOCINONIDE 0.05 % TOPICAL OINTMENT
Freq: Two times a day (BID) | TOPICAL | 3 refills | 0.00000 days | Status: CP
Start: 2023-07-02 — End: 2024-07-01

## 2023-07-09 ENCOUNTER — Encounter: Payer: Self-pay | Admitting: *Deleted

## 2023-07-13 ENCOUNTER — Other Ambulatory Visit: Payer: Self-pay

## 2023-07-20 ENCOUNTER — Encounter
Admission: RE | Disposition: A | Discharge status: Home / Self Care | Payer: Self-pay | Source: Home / Self Care | Attending: Gastroenterology

## 2023-07-20 ENCOUNTER — Ambulatory Visit
Admission: RE | Admit: 2023-07-20 | Discharge: 2023-07-20 | Disposition: A | Discharge status: Home / Self Care | Payer: Medicare HMO | Attending: Gastroenterology | Admitting: Gastroenterology

## 2023-07-20 ENCOUNTER — Ambulatory Visit: Payer: Medicare HMO | Admitting: Certified Registered"

## 2023-07-20 DIAGNOSIS — I10 Essential (primary) hypertension: Secondary | ICD-10-CM | POA: Diagnosis not present

## 2023-07-20 DIAGNOSIS — K2289 Other specified disease of esophagus: Secondary | ICD-10-CM | POA: Diagnosis not present

## 2023-07-20 DIAGNOSIS — R1310 Dysphagia, unspecified: Secondary | ICD-10-CM | POA: Insufficient documentation

## 2023-07-20 DIAGNOSIS — E7800 Pure hypercholesterolemia, unspecified: Secondary | ICD-10-CM | POA: Diagnosis not present

## 2023-07-20 DIAGNOSIS — K219 Gastro-esophageal reflux disease without esophagitis: Secondary | ICD-10-CM | POA: Insufficient documentation

## 2023-07-20 HISTORY — PX: BIOPSY: SHX5522

## 2023-07-20 HISTORY — PX: ESOPHAGOGASTRODUODENOSCOPY (EGD) WITH PROPOFOL: SHX5813

## 2023-07-20 SURGERY — ESOPHAGOGASTRODUODENOSCOPY (EGD) WITH PROPOFOL
Anesthesia: General

## 2023-07-20 MED ORDER — SODIUM CHLORIDE 0.9 % IV SOLN: INTRAVENOUS | Status: DC | PRN

## 2023-07-20 MED ORDER — PROPOFOL 500 MG/50ML IV EMUL
INTRAVENOUS | Status: DC | PRN
  Administered 2023-07-20: 150 ug/kg/min via INTRAVENOUS

## 2023-07-20 MED ORDER — PROPOFOL 10 MG/ML IV BOLUS
INTRAVENOUS | Status: DC | PRN
  Administered 2023-07-20: 80 mg via INTRAVENOUS

## 2023-07-20 MED ORDER — LIDOCAINE HCL (CARDIAC) PF 100 MG/5ML IV SOSY
PREFILLED_SYRINGE | INTRAVENOUS | Status: DC | PRN
  Administered 2023-07-20: 40 mg via INTRAVENOUS

## 2023-07-20 NOTE — Anesthesia Preprocedure Evaluation (Signed)
Anesthesia Evaluation  Patient identified by MRN, date of birth, ID band Patient awake    Reviewed: Allergy & Precautions, NPO status , Patient's Chart, lab work & pertinent test results  History of Anesthesia Complications Negative for: history of anesthetic complications  Airway Mallampati: II  TM Distance: >3 FB Neck ROM: Full    Dental  (+) Upper Dentures   Pulmonary sleep apnea , neg COPD, Patient abstained from smoking.Not current smoker, former smoker   Pulmonary exam normal breath sounds clear to auscultation       Cardiovascular Exercise Tolerance: Good METShypertension, Pt. on medications + CAD and + Cardiac Stents  (-) Past MI (-) dysrhythmias  Rhythm:Regular Rate:Normal - Systolic murmurs    Neuro/Psych  PSYCHIATRIC DISORDERS Anxiety     negative neurological ROS     GI/Hepatic ,GERD  ,,(+)     (-) substance abuse    Endo/Other  neg diabetes    Renal/GU negative Renal ROS     Musculoskeletal   Abdominal   Peds  Hematology   Anesthesia Other Findings Past Medical History: No date: Acid reflux No date: Anxiety No date: Arthritis No date: CAD (coronary artery disease)     Comment:  stents No date: Cancer (HCC) No date: High cholesterol No date: Hypertension No date: Mycosis fungoides (HCC) No date: Pneumonia No date: Sleep apnea  Reproductive/Obstetrics                             Anesthesia Physical Anesthesia Plan  ASA: 3  Anesthesia Plan: General   Post-op Pain Management: Minimal or no pain anticipated   Induction: Intravenous  PONV Risk Score and Plan: 2 and Propofol infusion, TIVA and Ondansetron  Airway Management Planned: Nasal Cannula  Additional Equipment: None  Intra-op Plan:   Post-operative Plan:   Informed Consent: I have reviewed the patients History and Physical, chart, labs and discussed the procedure including the risks, benefits and  alternatives for the proposed anesthesia with the patient or authorized representative who has indicated his/her understanding and acceptance.     Dental advisory given  Plan Discussed with: CRNA and Surgeon  Anesthesia Plan Comments: (Discussed risks of anesthesia with patient, including possibility of difficulty with spontaneous ventilation under anesthesia necessitating airway intervention, PONV, and rare risks such as cardiac or respiratory or neurological events, and allergic reactions. Discussed the role of CRNA in patient's perioperative care. Patient understands.)       Anesthesia Quick Evaluation

## 2023-07-20 NOTE — Op Note (Signed)
[facility]astroenterology Patient Name: Manuel Cook Procedure Date: 07/20/2023 10:57 AM MRN: [id number] Account #: [id number] Date of Birth: 1946/03/14 Admit Type: Outpatient Age: 77 Room: [facility] ENDO ROOM 1 Gender: Male Note Status: Finalized Instrument Name: Upper Endoscope [id number] Procedure:             Upper GI endoscopy Indications:           Dysphagia Providers:             Eather Colas MD, MD Referring MD:          Eather Colas MD, MD (Referring MD), Barbette Reichmann, MD (Referring MD) Medicines:             Monitored Anesthesia Care Complications:         No immediate complications. Estimated blood loss:                         Minimal. Procedure:             Pre-Anesthesia Assessment:                        - Prior to the procedure, a History and Physical was                         performed, and patient medications and allergies were                         reviewed. The patient is competent. The risks and                         benefits of the procedure and the sedation options and                         risks were discussed with the patient. All questions                         were answered and informed consent was obtained.                         Patient identification and proposed procedure were                         verified by the physician, the nurse, the                         anesthesiologist, the anesthetist and the technician                         in the endoscopy suite. Mental Status Examination:                         alert and oriented. Airway Examination: normal                         oropharyngeal airway and neck mobility. Respiratory  Examination: clear to auscultation. CV Examination:                         normal. Prophylactic Antibiotics: The patient does not                         require prophylactic antibiotics. Prior                         Anticoagulants:  The patient has taken no anticoagulant                         or antiplatelet agents. ASA Grade Assessment: III - A                         patient with severe systemic disease. After reviewing                         the risks and benefits, the patient was deemed in                         satisfactory condition to undergo the procedure. The                         anesthesia plan was to use monitored anesthesia care                         (MAC). Immediately prior to administration of                         medications, the patient was re-assessed for adequacy                         to receive sedatives. The heart rate, respiratory                         rate, oxygen saturations, blood pressure, adequacy of                         pulmonary ventilation, and response to care were                         monitored throughout the procedure. The physical                         status of the patient was re-assessed after the                         procedure.                        After obtaining informed consent, the endoscope was                         passed under direct vision. Throughout the procedure,                         the patient's blood pressure, pulse, and oxygen  saturations were monitored continuously. The Endoscope                         was introduced through the mouth, and advanced to the                         second part of duodenum. The upper GI endoscopy was                         accomplished without difficulty. The patient tolerated                         the procedure well. Findings:      White nummular lesions were noted in the upper third of the esophagus.       Very few lesions Biopsies were taken with a cold forceps for histology.       Estimated blood loss was minimal.      The entire examined stomach was normal.      The examined duodenum was normal. Impression:            - White nummular lesions in esophageal mucosa.                          Biopsied.                        - Normal stomach.                        - Normal examined duodenum. Recommendation:        - Discharge patient to home.                        - Resume previous diet.                        - Continue present medications.                        - Await pathology results.                        - Return to referring physician as previously                         scheduled. Procedure Code(s):     --- Professional ---                        [phone], Esophagogastroduodenoscopy, flexible,                         transoral; with biopsy, single or multiple Diagnosis Code(s):     --- Professional ---                        K22.89, Other specified disease of esophagus                        R13.10, Dysphagia, unspecified CPT copyright 2022 American Medical Association. All rights reserved. The codes documented in this report are preliminary and upon coder review may  be revised to meet  current compliance requirements. Eather Colas MD, MD 07/20/2023 12:04:05 PM Number of Addenda: 0 Note Initiated On: 07/20/2023 10:57 AM Estimated Blood Loss:  Estimated blood loss was minimal.      [facility]

## 2023-07-20 NOTE — Anesthesia Postprocedure Evaluation (Signed)
Anesthesia Post Note  Patient: Manuel Cook  Procedure(s) Performed: ESOPHAGOGASTRODUODENOSCOPY (EGD) WITH PROPOFOL BIOPSY  Patient location during evaluation: Endoscopy Anesthesia Type: General Level of consciousness: awake and alert Pain management: pain level controlled Vital Signs Assessment: post-procedure vital signs reviewed and stable Respiratory status: spontaneous breathing, nonlabored ventilation, respiratory function stable and patient connected to nasal cannula oxygen Cardiovascular status: blood pressure returned to baseline and stable Postop Assessment: no apparent nausea or vomiting Anesthetic complications: no   No notable events documented.   Last Vitals:  Vitals:   07/20/23 1210 07/20/23 1224  BP: (!) 143/78 (!) 169/75  Pulse: 67 60  Resp: (!) 21 20  Temp:    SpO2: 100% 100%    Last Pain:  Vitals:   07/20/23 1224  TempSrc:   PainSc: 0-No pain                 Corinda Gubler

## 2023-07-20 NOTE — H&P (Signed)
Outpatient short stay form Pre-procedure 07/20/2023  Regis Bill, MD  Primary Physician: Barbette Reichmann, MD  Reason for visit:  Dysphagia  History of present illness:    77 y/o gentleman with history of HLD, GERD, and hypertension here for EGD for solid and liquid dysphagia. Had normal EGD in 2022 with empiric dilation with no improvement in symptoms. No blood thinners. No family history of GI malignancies. No significant neck or abdominal surgeries.   No current facility-administered medications for this encounter.  Medications Prior to Admission  Medication Sig Dispense Refill Last Dose   acetaminophen (TYLENOL) 500 MG tablet Take 1,000 mg by mouth every 6 (six) hours as needed for moderate pain or headache.   07/19/2023   acitretin (SORIATANE) 25 MG capsule Take 25 mg by mouth daily before breakfast.   07/19/2023   atenolol (TENORMIN) 25 MG tablet Take by mouth daily.   07/20/2023   atorvastatin (LIPITOR) 20 MG tablet Take 20 mg by mouth daily.   07/19/2023   augmented betamethasone dipropionate (DIPROLENE-AF) 0.05 % ointment Apply topically 2 (two) times daily.   07/19/2023   bexarotene (TARGRETIN) 75 MG CAPS capsule Take by mouth.   07/20/2023   clobetasol cream (TEMOVATE) 0.05 % Apply 1 application topically 2 (two) times daily.   07/19/2023   methocarbamol (ROBAXIN) 500 MG tablet Take 1 tablet (500 mg total) by mouth every 6 (six) hours as needed for muscle spasms. 30 tablet 3 07/19/2023   pantoprazole (PROTONIX) 40 MG tablet Take 40 mg by mouth daily as needed (acid reflux).   07/19/2023   triamcinolone ointment (KENALOG) 0.1 % Apply 1 application topically 2 (two) times daily.   07/19/2023   aspirin 81 MG EC tablet Take 81 mg by mouth daily.        diclofenac (VOLTAREN) 75 MG EC tablet Take 75 mg by mouth 2 (two) times daily. (Patient not taking: Reported on 07/13/2023)   Not Taking   ezetimibe (ZETIA) 10 MG tablet Take 10 mg by mouth daily. (Patient not taking:  Reported on 07/13/2023)   Not Taking   gabapentin (NEURONTIN) 100 MG capsule Take 100 mg by mouth 3 (three) times daily. (Patient not taking: Reported on 07/13/2023)   Not Taking   Melatonin 1 MG CAPS Take 1 mg by mouth at bedtime as needed (sleep). (Patient not taking: Reported on 07/13/2023)   Not Taking   misoprostol (CYTOTEC) 200 MCG tablet Take 200 mcg by mouth 2 (two) times daily. (Patient not taking: Reported on 07/13/2023)   Not Taking   oxyCODONE-acetaminophen (PERCOCET/ROXICET) 5-325 MG tablet Take 1-2 tablets by mouth every 4 (four) hours as needed for moderate pain or severe pain. (Patient not taking: Reported on 07/13/2023) 60 tablet 0 Not Taking     Allergies  Allergen Reactions   Sulfa Antibiotics     Unknown reaction     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HCC)    Pneumonia    Sleep apnea     Review of systems:  Otherwise negative.    Physical Exam  Gen: Alert, oriented. Appears stated age.  HEENT: PERRLA. Lungs: No respiratory distress CV: RRR Abd: soft, benign, no masses Ext: No edema    Planned procedures: Proceed with EGD. The patient understands the  nature of the planned procedure, indications, risks, alternatives and potential complications including but not limited to bleeding, infection, perforation, damage to internal organs and possible oversedation/side effects from anesthesia. The patient agrees and gives consent to proceed.  Please refer to procedure notes for findings, recommendations and patient disposition/instructions.     Regis Bill, MD [facility] Gastroenterology

## 2023-07-20 NOTE — Interval H&P Note (Signed)
History and Physical Interval Note:  07/20/2023 11:44 AM  Manuel Cook  has presented today for surgery, with the diagnosis of Dysphagia.  The various methods of treatment have been discussed with the patient and family. After consideration of risks, benefits and other options for treatment, the patient has consented to  Procedure(s): ESOPHAGOGASTRODUODENOSCOPY (EGD) WITH PROPOFOL (N/A) as a surgical intervention.  The patient's history has been reviewed, patient examined, no change in status, stable for surgery.  I have reviewed the patient's chart and labs.  Questions were answered to the patient's satisfaction.     Regis Bill  Ok to proceed with EGD

## 2023-07-20 NOTE — Transfer of Care (Signed)
Immediate Anesthesia Transfer of Care Note  Patient: Manuel Cook  Procedure(s) Performed: ESOPHAGOGASTRODUODENOSCOPY (EGD) WITH PROPOFOL BIOPSY  Patient Location: Endoscopy Unit  Anesthesia Type:General  Level of Consciousness: awake, drowsy, and patient cooperative  Airway & Oxygen Therapy: Patient Spontanous Breathing  Post-op Assessment: Report given to RN, Post -op Vital signs reviewed and stable, and Patient moving all extremities X 4  Post vital signs: Reviewed and stable  Last Vitals:  Vitals Value Taken Time  BP 138/64 07/20/23 1204  Temp 36.5 C 07/20/23 1204  Pulse 51 07/20/23 1205  Resp 21 07/20/23 1205  SpO2 100 % 07/20/23 1205  Vitals shown include unfiled device data.  Last Pain:  Vitals:   07/20/23 1204  TempSrc: Temporal  PainSc: Asleep         Complications: No notable events documented.

## 2023-07-21 ENCOUNTER — Encounter: Payer: Self-pay | Admitting: Gastroenterology

## 2023-07-21 LAB — SURGICAL PATHOLOGY

## 2023-08-11 MED ORDER — PREDNISONE 10 MG TABLET
ORAL_TABLET | ORAL | 0 refills | 20.00000 days | Status: CP
Start: 2023-08-11 — End: 2023-08-31

## 2023-09-17 ENCOUNTER — Ambulatory Visit: Admit: 2023-09-17 | Discharge: 2023-09-18 | Attending: Dermatology | Primary: Dermatology

## 2023-09-17 DIAGNOSIS — C8400 Mycosis fungoides, unspecified site: Principal | ICD-10-CM

## 2023-09-17 MED ORDER — PREDNISONE 10 MG TABLET
ORAL_TABLET | ORAL | 0 refills | 28.00 days | Status: CP
Start: 2023-09-17 — End: 2023-10-15

## 2023-09-17 MED ORDER — DOXYCYCLINE HYCLATE 100 MG TABLET
ORAL_TABLET | Freq: Two times a day (BID) | ORAL | 0 refills | 30.00 days | Status: CP
Start: 2023-09-17 — End: 2023-10-17

## 2023-09-30 ENCOUNTER — Inpatient Hospital Stay
Admit: 2023-09-30 | Discharge: 2023-10-29 | Payer: PRIVATE HEALTH INSURANCE | Attending: Student in an Organized Health Care Education/Training Program | Primary: Student in an Organized Health Care Education/Training Program

## 2023-09-30 ENCOUNTER — Ambulatory Visit
Admit: 2023-09-30 | Discharge: 2023-10-29 | Payer: PRIVATE HEALTH INSURANCE | Attending: Student in an Organized Health Care Education/Training Program | Primary: Student in an Organized Health Care Education/Training Program

## 2023-10-14 ENCOUNTER — Ambulatory Visit
Admit: 2023-10-14 | Discharge: 2023-10-14 | Payer: PRIVATE HEALTH INSURANCE | Attending: Dermatology | Primary: Dermatology

## 2023-10-14 ENCOUNTER — Ambulatory Visit: Admit: 2023-10-14 | Discharge: 2023-10-14 | Payer: PRIVATE HEALTH INSURANCE

## 2023-10-14 DIAGNOSIS — C8400 Mycosis fungoides, unspecified site: Principal | ICD-10-CM

## 2023-10-14 DIAGNOSIS — Z85828 Personal history of other malignant neoplasm of skin: Principal | ICD-10-CM

## 2023-10-14 DIAGNOSIS — R21 Rash and other nonspecific skin eruption: Principal | ICD-10-CM

## 2023-10-18 ENCOUNTER — Emergency Department
Admit: 2023-10-18 | Discharge: 2023-10-19 | Disposition: A | Discharge status: Home / Self Care | Payer: PRIVATE HEALTH INSURANCE

## 2023-10-18 DIAGNOSIS — C8400 Mycosis fungoides, unspecified site: Principal | ICD-10-CM

## 2023-10-18 DIAGNOSIS — Z792 Long term (current) use of antibiotics: Secondary | ICD-10-CM | POA: Diagnosis not present

## 2023-10-18 DIAGNOSIS — Z923 Personal history of irradiation: Secondary | ICD-10-CM | POA: Diagnosis not present

## 2023-10-18 DIAGNOSIS — Z882 Allergy status to sulfonamides status: Secondary | ICD-10-CM | POA: Diagnosis not present

## 2023-10-18 DIAGNOSIS — M7989 Other specified soft tissue disorders: Secondary | ICD-10-CM | POA: Diagnosis not present

## 2023-10-18 DIAGNOSIS — Z7982 Long term (current) use of aspirin: Secondary | ICD-10-CM | POA: Diagnosis not present

## 2023-10-18 MED ORDER — OXYCODONE-ACETAMINOPHEN 5 MG-325 MG TABLET
ORAL_TABLET | ORAL | 0 refills | 3 days | Status: CP | PRN
Start: 2023-10-18 — End: 2023-10-23

## 2023-10-18 MED ORDER — CLOBETASOL 0.05 % TOPICAL CREAM
Freq: Two times a day (BID) | TOPICAL | 0 refills | 0.00 days | Status: CP
Start: 2023-10-18 — End: 2024-10-17

## 2023-10-19 DIAGNOSIS — C8400 Mycosis fungoides, unspecified site: Secondary | ICD-10-CM | POA: Diagnosis not present

## 2023-10-19 DIAGNOSIS — C84A9 Cutaneous T-cell lymphoma, unspecified, extranodal and solid organ sites: Secondary | ICD-10-CM | POA: Diagnosis not present

## 2023-10-19 MED ORDER — FUROSEMIDE 40 MG TABLET
ORAL_TABLET | Freq: Every day | ORAL | 0 refills | 7.00 days | Status: CP
Start: 2023-10-19 — End: 2023-10-26

## 2023-10-22 MED ORDER — FUROSEMIDE 40 MG TABLET
ORAL_TABLET | ORAL | 0 refills | 0.00 days
Start: 2023-10-22 — End: ?

## 2023-10-28 ENCOUNTER — Encounter: Payer: Self-pay | Admitting: Emergency Medicine

## 2023-10-28 ENCOUNTER — Emergency Department: Payer: Self-pay

## 2023-10-28 ENCOUNTER — Other Ambulatory Visit: Payer: Self-pay

## 2023-10-28 ENCOUNTER — Emergency Department
Admission: EM | Admit: 2023-10-28 | Discharge: 2023-10-29 | Disposition: A | Discharge status: Home / Self Care | Payer: Self-pay | Attending: Emergency Medicine | Admitting: Emergency Medicine

## 2023-10-28 DIAGNOSIS — R600 Localized edema: Secondary | ICD-10-CM | POA: Diagnosis not present

## 2023-10-28 DIAGNOSIS — C8400 Mycosis fungoides, unspecified site: Secondary | ICD-10-CM | POA: Insufficient documentation

## 2023-10-28 LAB — CBC WITH DIFFERENTIAL/PLATELET
Abs Immature Granulocytes: 0.36 10*3/uL — ABNORMAL HIGH (ref 0.00–0.07)
Basophils Absolute: 0 10*3/uL (ref 0.0–0.1)
Basophils Relative: 0 %
Eosinophils Absolute: 0 10*3/uL (ref 0.0–0.5)
Eosinophils Relative: 0 %
HCT: 39.9 % (ref 39.0–52.0)
Hemoglobin: 13.9 g/dL (ref 13.0–17.0)
Immature Granulocytes: 4 %
Lymphocytes Relative: 8 %
Lymphs Abs: 0.7 10*3/uL (ref 0.7–4.0)
MCH: 33.8 pg (ref 26.0–34.0)
MCHC: 34.8 g/dL (ref 30.0–36.0)
MCV: 97.1 fL (ref 80.0–100.0)
Monocytes Absolute: 1 10*3/uL (ref 0.1–1.0)
Monocytes Relative: 11 %
Neutro Abs: 7 10*3/uL (ref 1.7–7.7)
Neutrophils Relative %: 77 %
Platelets: 284 10*3/uL (ref 150–400)
RBC: 4.11 MIL/uL — ABNORMAL LOW (ref 4.22–5.81)
RDW: 14 % (ref 11.5–15.5)
Smear Review: NORMAL
WBC: 9.3 10*3/uL (ref 4.0–10.5)
nRBC: 0 % (ref 0.0–0.2)

## 2023-10-28 LAB — COMPREHENSIVE METABOLIC PANEL
ALT: 22 U/L (ref 0–44)
AST: 24 U/L (ref 15–41)
Albumin: 3.5 g/dL (ref 3.5–5.0)
Alkaline Phosphatase: 59 U/L (ref 38–126)
Anion gap: 14 (ref 5–15)
BUN: 14 mg/dL (ref 8–23)
CO2: 24 mmol/L (ref 22–32)
Calcium: 9.2 mg/dL (ref 8.9–10.3)
Chloride: 101 mmol/L (ref 98–111)
Creatinine, Ser: 1.02 mg/dL (ref 0.61–1.24)
GFR, Estimated: >60 mL/min (ref >60)
Glucose, Bld: 96 mg/dL (ref 70–99)
Potassium: 3 mmol/L — ABNORMAL LOW (ref 3.5–5.1)
Sodium: 139 mmol/L (ref 135–145)
Total Bilirubin: 1.8 mg/dL — ABNORMAL HIGH (ref 0.0–1.2)
Total Protein: 6.5 g/dL (ref 6.5–8.1)

## 2023-10-28 LAB — BRAIN NATRIURETIC PEPTIDE: B Natriuretic Peptide: 55.2 pg/mL (ref 0.0–100.0)

## 2023-10-28 LAB — TROPONIN I (HIGH SENSITIVITY): Troponin I (High Sensitivity): 12 ng/L (ref <18)

## 2023-10-28 MED ORDER — FUROSEMIDE 40 MG PO TABS
40.0000 mg | ORAL_TABLET | Freq: Once | ORAL | Status: AC
  Administered 2023-10-29: 40 mg via ORAL
  Filled 2023-10-28: qty 1

## 2023-10-28 MED ORDER — POTASSIUM CHLORIDE CRYS ER 20 MEQ PO TBCR
40.0000 meq | EXTENDED_RELEASE_TABLET | Freq: Once | ORAL | Status: AC
  Administered 2023-10-29: 40 meq via ORAL
  Filled 2023-10-28: qty 2

## 2023-10-28 MED ORDER — FUROSEMIDE 20 MG PO TABS: 40.0000 mg | ORAL_TABLET | Freq: Every day | ORAL | 0 refills | Status: AC

## 2023-10-28 MED ORDER — POTASSIUM CHLORIDE CRYS ER 20 MEQ PO TBCR: 20.0000 meq | EXTENDED_RELEASE_TABLET | Freq: Every day | ORAL | 0 refills | Status: DC

## 2023-10-28 MED ORDER — FUROSEMIDE 10 MG/ML IJ SOLN: 40.0000 mg | Freq: Once | INTRAMUSCULAR | Status: DC

## 2023-10-28 NOTE — ED Provider Triage Note (Signed)
Emergency Medicine Provider Triage Evaluation Note  Manuel Cook , a 78 y.o. male  was evaluated in triage.  Pt complains of pain and swelling of bilateral lower extremities. History of mycosis fungiodes and has multiple large lesions on extremities. Pain is increasing despite pain medication. Currently awaiting treatment plan at [facility].  Physical Exam  BP (!) 199/71 (BP Location: Left Arm)   Pulse (!) 113   Temp 98.1 F (36.7 C) (Oral)   Resp 20   Ht 5\' 8"  (1.727 m)   Wt 92 kg   SpO2 98%   BMI 30.84 kg/m  Gen:   Awake, no distress   Resp:  Normal effort MSK:   Moves extremities without difficulty  Other:  Necrotic plaques bilateral LE and right upper extremity.  Medical Decision Making  Medically screening exam initiated at 5:48 PM.  Appropriate orders placed.  COHEN BOETTNER was informed that the remainder of the evaluation will be completed by another provider, this initial triage assessment does not replace that evaluation, and the importance of remaining in the ED until their evaluation is complete.  Plan will be to get [place] bilateral lower extremities since legs and feet are extremely edematous.    Chinita Pester, FNP 10/28/23 2036

## 2023-10-28 NOTE — ED Triage Notes (Signed)
Pt to ED via POV c/o bilateral leg swelling. Pt states that his feet are so swollen that he cannot get shoes on. The swelling is now extending all the way up his legs and into his groin. Pt states that he has skin lesions on his legs and arms and they seem like they are getting worse. Pt reports having some shortness of breath. Per pts wife he is not currently getting any treatment because [facility] has kept pushing it back.

## 2023-10-28 NOTE — ED Provider Notes (Signed)
[facility] Provider Note    Event Date/Time   First MD Initiated Contact with Patient 10/28/23 2111     (approximate)   History   Chief Complaint Edema and Shortness of Breath   HPI  Manuel Cook is a 78 y.o. male with past medical history of hypertension, hyperlipidemia, CAD, and mycosis fungoides who presents to the ED complaining of edema.  Patient reports that he has been dealing with increasing swelling in both of his legs as well as his groin area for the past week.  He reports some slight difficulty breathing as well, denies any fevers, cough, or chest pain.  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  He states these continue to be painful with some drainage and he has been seeing dermatology as well as oncology at [facility] for this problem.  He has been treated with steroids, antibiotics, and radiation without significant relief.  He was scheduled for follow-up at [facility] tomorrow, but states he canceled this appointment to come to the ED for evaluation.  He states that the lesions have been about the same while the swelling in his legs have been getting worse.     Physical Exam   Triage Vital Signs: ED Triage Vitals  Encounter Vitals Group     BP 10/28/23 1723 (!) 199/71     Systolic BP Percentile --      Diastolic BP Percentile --      Pulse Rate 10/28/23 1723 (!) 113     Resp 10/28/23 1723 20     Temp 10/28/23 1723 98.1 F (36.7 C)     Temp Source 10/28/23 1723 Oral     SpO2 10/28/23 1723 98 %     Weight 10/28/23 1724 202 lb 13.2 oz (92 kg)     Height 10/28/23 1724 5\' 8"  (1.727 m)     Head Circumference --      Peak Flow --      Pain Score 10/28/23 1744 8     Pain Loc --      Pain Education --      Exclude from Growth Chart --     Most recent vital signs: Vitals:   10/28/23 2204 10/28/23 2210  BP: (!) 186/65   Pulse: 96   Resp: 18   Temp: 98.1 F (36.7  C) 98.2 F (36.8 C)  SpO2: 96%     Constitutional: Alert and oriented. Eyes: Conjunctivae are normal. Head: Atraumatic. Nose: No congestion/rhinnorhea. Mouth/Throat: Mucous membranes are moist.  Cardiovascular: Normal rate, regular rhythm. Grossly normal heart sounds.  2+ radial pulses bilaterally. Respiratory: Normal respiratory effort.  No retractions. Lungs CTAB. Gastrointestinal: Soft and nontender. No distention. Genitourinary: Edema of the scrotum with no erythema, warmth, or tenderness. Musculoskeletal: 2+ pitting edema to upper thighs bilaterally.  Patient with large areas of patchy erythema to bilateral lower extremities, arms, and trunk.  Large necrotic lesions noted to right lower leg, right upper arm, and left posterior leg.  No purulent drainage noted. Neurologic:  Normal speech and language. No gross focal neurologic deficits are appreciated.    ED Results / Procedures / Treatments   Labs (all labs ordered are listed, but only abnormal results are displayed) Labs Reviewed  COMPREHENSIVE METABOLIC PANEL - Abnormal; Notable for the following components:      Result Value   Potassium 3.0 (*)    Total Bilirubin 1.8 (*)  All other components within normal limits  CBC WITH DIFFERENTIAL/PLATELET - Abnormal; Notable for the following components:   RBC 4.11 (*)    Abs Immature Granulocytes 0.36 (*)    All other components within normal limits  BRAIN NATRIURETIC PEPTIDE  TROPONIN I (HIGH SENSITIVITY)     EKG  ED ECG REPORT I, Chesley Noon, the attending physician, personally viewed and interpreted this ECG.   Date: 10/28/2023  EKG Time: 17:29  Rate: 103  Rhythm: sinus tachycardia  Axis: Normal  Intervals:none  ST&T Change: Nonspecific ST/T abnormality  RADIOLOGY Lower extremity ultrasound reviewed and interpreted by me with no evidence of DVT.  PROCEDURES:  Critical Care performed: No  Procedures   MEDICATIONS ORDERED IN ED: Medications   potassium chloride SA (KLOR-CON M) CR tablet 40 mEq (has no administration in time range)  furosemide (LASIX) tablet 40 mg (has no administration in time range)     IMPRESSION / MDM / ASSESSMENT AND PLAN / ED COURSE  I reviewed the triage vital signs and the nursing notes.                              78 y.o. male with past medical history of hypertension, hyperlipidemia, CAD, and mycosis fungoides who presents to the ED complaining of increasing lower extremity swelling extending into his groin over the past few days.  Patient's presentation is most consistent with acute presentation with potential threat to life or bodily function.  Differential diagnosis includes, but is not limited to, CHF, peripheral edema, venous insufficiency, electrolyte abnormality, AKI.  Patient chronically ill but nontoxic-appearing and in no acute distress, vital signs are unremarkable.  He has chronic skin lesions including large necrotic lesions to both lower extremities as well as his right upper arm.  These appear unchanged today and have been chronically managed by dermatology as well as hematology at [facility].  No findings of superimposed infection at this time and patient's primary concern currently seems to be lower extremity swelling.  He reports some mild difficulty breathing but is not in any respiratory distress, maintaining oxygen saturations on room air.  Chest x-ray is unremarkable and troponin within normal limits.  Labs showed no significant anemia, leukocytosis, or AKI.  He does have mild hypokalemia and was given repletion.  No findings to suggest CHF at this time, patient was offered IV dose of Lasix but declines and is requesting to be discharged home.  We will give oral dose of Lasix tonight and prescribe 1 week course of Lasix as well as supplemental potassium.  He was counseled to follow-up at [facility] and to return to the ED for new or worsening symptoms.  Patient agrees with plan.      FINAL  CLINICAL IMPRESSION(S) / ED DIAGNOSES   Final diagnoses:  Peripheral edema  Mycosis fungoides, unspecified body region [facility])     Rx / DC Orders   ED Discharge Orders          Ordered    furosemide (LASIX) 20 MG tablet  Daily        10/28/23 2349    potassium chloride SA (KLOR-CON M) 20 MEQ tablet  Daily        10/28/23 2349             Note:  This document was prepared using Dragon voice recognition software and may include unintentional dictation errors.   Chesley Noon, MD 10/29/23 0000

## 2023-10-29 ENCOUNTER — Ambulatory Visit: Admit: 2023-10-29 | Payer: PRIVATE HEALTH INSURANCE

## 2023-10-29 DIAGNOSIS — Z79899 Other long term (current) drug therapy: Secondary | ICD-10-CM | POA: Diagnosis not present

## 2023-10-29 DIAGNOSIS — C8400 Mycosis fungoides, unspecified site: Secondary | ICD-10-CM | POA: Diagnosis not present

## 2023-10-29 MED ORDER — PREDNISONE 50 MG TABLET
ORAL_TABLET | Freq: Every day | ORAL | 0 refills | 7.00 days | Status: CP
Start: 2023-10-29 — End: 2023-11-05

## 2023-11-01 ENCOUNTER — Ambulatory Visit: Admit: 2023-11-01 | Payer: PRIVATE HEALTH INSURANCE

## 2023-11-01 DIAGNOSIS — Z79899 Other long term (current) drug therapy: Principal | ICD-10-CM

## 2023-11-02 DIAGNOSIS — Z79899 Other long term (current) drug therapy: Principal | ICD-10-CM

## 2023-11-03 DIAGNOSIS — Z79899 Other long term (current) drug therapy: Principal | ICD-10-CM

## 2023-11-04 ENCOUNTER — Inpatient Hospital Stay: Admit: 2023-11-04 | Discharge: 2023-11-05 | Payer: PRIVATE HEALTH INSURANCE

## 2023-11-04 DIAGNOSIS — C8400 Mycosis fungoides, unspecified site: Secondary | ICD-10-CM | POA: Diagnosis not present

## 2023-11-04 DIAGNOSIS — C84A9 Cutaneous T-cell lymphoma, unspecified, extranodal and solid organ sites: Secondary | ICD-10-CM | POA: Diagnosis not present

## 2023-11-05 ENCOUNTER — Other Ambulatory Visit: Admit: 2023-11-05 | Discharge: 2023-11-06 | Payer: PRIVATE HEALTH INSURANCE

## 2023-11-05 ENCOUNTER — Inpatient Hospital Stay: Admit: 2023-11-05 | Discharge: 2023-11-06 | Payer: PRIVATE HEALTH INSURANCE

## 2023-11-05 ENCOUNTER — Ambulatory Visit: Admit: 2023-11-05 | Discharge: 2023-11-06 | Payer: PRIVATE HEALTH INSURANCE

## 2023-11-05 DIAGNOSIS — C8400 Mycosis fungoides, unspecified site: Principal | ICD-10-CM

## 2023-11-05 DIAGNOSIS — L98499 Non-pressure chronic ulcer of skin of other sites with unspecified severity: Principal | ICD-10-CM

## 2023-11-05 DIAGNOSIS — Z79899 Other long term (current) drug therapy: Principal | ICD-10-CM

## 2023-11-05 DIAGNOSIS — Z5111 Encounter for antineoplastic chemotherapy: Secondary | ICD-10-CM | POA: Diagnosis not present

## 2023-11-05 DIAGNOSIS — Z125 Encounter for screening for malignant neoplasm of prostate: Secondary | ICD-10-CM | POA: Diagnosis not present

## 2023-11-05 MED ORDER — ONDANSETRON HCL 8 MG TABLET
ORAL_TABLET | Freq: Three times a day (TID) | ORAL | 2 refills | 10.00 days | Status: CP | PRN
Start: 2023-11-05 — End: 2024-11-04
  Filled 2023-11-05: qty 30, 10d supply, fill #0

## 2023-11-05 MED ORDER — FUROSEMIDE 40 MG TABLET
ORAL_TABLET | Freq: Every day | ORAL | 11 refills | 30.00 days | Status: CP
Start: 2023-11-05 — End: 2024-11-04

## 2023-11-05 MED ORDER — POTASSIUM CHLORIDE ER 20 MEQ TABLET,EXTENDED RELEASE(PART/CRYST)
ORAL_TABLET | Freq: Every day | ORAL | 11 refills | 30.00 days | Status: CP
Start: 2023-11-05 — End: 2024-11-04

## 2023-11-05 MED ORDER — DOXYCYCLINE HYCLATE 100 MG TABLET
ORAL_TABLET | Freq: Two times a day (BID) | ORAL | 0 refills | 10.00 days | Status: CP
Start: 2023-11-05 — End: 2023-11-15
  Filled 2023-11-05: qty 20, 10d supply, fill #0

## 2023-11-06 DIAGNOSIS — Z79899 Other long term (current) drug therapy: Principal | ICD-10-CM

## 2023-11-08 DIAGNOSIS — Z79899 Other long term (current) drug therapy: Principal | ICD-10-CM

## 2023-11-09 NOTE — Progress Notes (Signed)
 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 with MF. Chemotherapy education was provided to the patient by the oncology pharmacy team.  Side effects discussed included but were not limited to:  infusion-related reactions, extravasation, complications associated with myelosuppresion (such as infection/fever, fatigue, and bleeding), nausea/vomiting, diarrhea, constipation, flu-like symptoms, rash, pulmonary toxicity, cough, dyspnea, or fatigue.  The [facility] patient handout or the Hematology/Oncology Fellow on-call phone number for concerns after hours were given.The patient verbalized understanding of this information.  Medication reconciliation was completed and the medication list was updated in EPIC.    Approximate time spent with patient: 15  Minutes.  Rosaline Rimes, PharmD, CPP

## 2023-11-10 DIAGNOSIS — Z79899 Other long term (current) drug therapy: Principal | ICD-10-CM

## 2023-11-12 ENCOUNTER — Other Ambulatory Visit: Admit: 2023-11-12 | Discharge: 2023-11-13 | Payer: PRIVATE HEALTH INSURANCE

## 2023-11-12 ENCOUNTER — Inpatient Hospital Stay: Admit: 2023-11-12 | Discharge: 2023-11-13 | Payer: PRIVATE HEALTH INSURANCE

## 2023-11-12 ENCOUNTER — Ambulatory Visit: Admit: 2023-11-12 | Discharge: 2023-11-13 | Payer: PRIVATE HEALTH INSURANCE

## 2023-11-12 ENCOUNTER — Ambulatory Visit: Payer: PPO | Admitting: Physician Assistant

## 2023-11-12 DIAGNOSIS — Z79899 Other long term (current) drug therapy: Principal | ICD-10-CM

## 2023-11-12 DIAGNOSIS — C8400 Mycosis fungoides, unspecified site: Principal | ICD-10-CM

## 2023-11-12 DIAGNOSIS — Z125 Encounter for screening for malignant neoplasm of prostate: Principal | ICD-10-CM

## 2023-11-12 DIAGNOSIS — Z5111 Encounter for antineoplastic chemotherapy: Secondary | ICD-10-CM | POA: Diagnosis not present

## 2023-11-12 DIAGNOSIS — I519 Heart disease, unspecified: Secondary | ICD-10-CM | POA: Diagnosis not present

## 2023-11-12 DIAGNOSIS — L98499 Non-pressure chronic ulcer of skin of other sites with unspecified severity: Secondary | ICD-10-CM | POA: Diagnosis not present

## 2023-11-12 DIAGNOSIS — Z923 Personal history of irradiation: Secondary | ICD-10-CM | POA: Diagnosis not present

## 2023-11-12 DIAGNOSIS — Z7982 Long term (current) use of aspirin: Secondary | ICD-10-CM | POA: Diagnosis not present

## 2023-11-12 MED ORDER — DOXYCYCLINE HYCLATE 100 MG TABLET
ORAL_TABLET | Freq: Two times a day (BID) | ORAL | 0 refills | 10.00 days | Status: CP
Start: 2023-11-12 — End: 2023-12-12

## 2023-11-13 DIAGNOSIS — Z79899 Other long term (current) drug therapy: Principal | ICD-10-CM

## 2023-11-15 DIAGNOSIS — Z79899 Other long term (current) drug therapy: Principal | ICD-10-CM

## 2023-11-18 DIAGNOSIS — Z79899 Other long term (current) drug therapy: Principal | ICD-10-CM

## 2023-11-19 DIAGNOSIS — Z79899 Other long term (current) drug therapy: Principal | ICD-10-CM

## 2023-11-23 DIAGNOSIS — Z79899 Other long term (current) drug therapy: Principal | ICD-10-CM

## 2023-11-25 DIAGNOSIS — Z79899 Other long term (current) drug therapy: Principal | ICD-10-CM

## 2023-11-26 ENCOUNTER — Other Ambulatory Visit: Admit: 2023-11-26 | Discharge: 2023-11-27 | Payer: PRIVATE HEALTH INSURANCE

## 2023-11-26 ENCOUNTER — Ambulatory Visit: Admit: 2023-11-26 | Discharge: 2023-11-27 | Payer: PRIVATE HEALTH INSURANCE

## 2023-11-26 ENCOUNTER — Inpatient Hospital Stay: Admit: 2023-11-26 | Discharge: 2023-11-27 | Payer: PRIVATE HEALTH INSURANCE

## 2023-11-26 DIAGNOSIS — L98499 Non-pressure chronic ulcer of skin of other sites with unspecified severity: Principal | ICD-10-CM

## 2023-11-26 DIAGNOSIS — Z125 Encounter for screening for malignant neoplasm of prostate: Principal | ICD-10-CM

## 2023-11-26 DIAGNOSIS — C8400 Mycosis fungoides, unspecified site: Principal | ICD-10-CM

## 2023-11-26 DIAGNOSIS — Z79899 Other long term (current) drug therapy: Principal | ICD-10-CM

## 2023-11-26 DIAGNOSIS — R600 Localized edema: Secondary | ICD-10-CM | POA: Diagnosis not present

## 2023-11-26 DIAGNOSIS — Z7982 Long term (current) use of aspirin: Secondary | ICD-10-CM | POA: Diagnosis not present

## 2023-11-26 DIAGNOSIS — Z5111 Encounter for antineoplastic chemotherapy: Secondary | ICD-10-CM | POA: Diagnosis not present

## 2023-11-26 DIAGNOSIS — Z923 Personal history of irradiation: Secondary | ICD-10-CM | POA: Diagnosis not present

## 2023-11-26 MED ORDER — DOXYCYCLINE HYCLATE 100 MG TABLET
ORAL_TABLET | Freq: Two times a day (BID) | ORAL | 0 refills | 10.00 days | Status: CP
Start: 2023-11-26 — End: 2023-12-26

## 2023-11-26 MED ORDER — GABAPENTIN 100 MG CAPSULE
ORAL_CAPSULE | Freq: Three times a day (TID) | ORAL | 3 refills | 90.00 days | Status: CP
Start: 2023-11-26 — End: 2024-11-25
  Filled 2023-11-26: qty 270, 90d supply, fill #0

## 2023-11-26 MED ORDER — FUROSEMIDE 40 MG TABLET
ORAL_TABLET | ORAL | 0 refills | 7.00 days
Start: 2023-11-26 — End: 2023-12-03

## 2023-11-29 ENCOUNTER — Ambulatory Visit: Admit: 2023-11-29 | Payer: PRIVATE HEALTH INSURANCE

## 2023-12-01 DIAGNOSIS — Z79899 Other long term (current) drug therapy: Principal | ICD-10-CM

## 2023-12-01 DIAGNOSIS — Z125 Encounter for screening for malignant neoplasm of prostate: Principal | ICD-10-CM

## 2023-12-02 DIAGNOSIS — Z125 Encounter for screening for malignant neoplasm of prostate: Principal | ICD-10-CM

## 2023-12-02 DIAGNOSIS — Z79899 Other long term (current) drug therapy: Principal | ICD-10-CM

## 2023-12-03 ENCOUNTER — Ambulatory Visit: Admit: 2023-12-03 | Discharge: 2023-12-04 | Payer: PRIVATE HEALTH INSURANCE

## 2023-12-03 ENCOUNTER — Inpatient Hospital Stay: Admit: 2023-12-03 | Discharge: 2023-12-04 | Payer: PRIVATE HEALTH INSURANCE

## 2023-12-03 ENCOUNTER — Other Ambulatory Visit: Admit: 2023-12-03 | Discharge: 2023-12-04 | Payer: PRIVATE HEALTH INSURANCE

## 2023-12-03 DIAGNOSIS — Z125 Encounter for screening for malignant neoplasm of prostate: Principal | ICD-10-CM

## 2023-12-03 DIAGNOSIS — C8400 Mycosis fungoides, unspecified site: Principal | ICD-10-CM

## 2023-12-03 DIAGNOSIS — Z79899 Other long term (current) drug therapy: Principal | ICD-10-CM

## 2023-12-03 DIAGNOSIS — L98499 Non-pressure chronic ulcer of skin of other sites with unspecified severity: Secondary | ICD-10-CM | POA: Diagnosis not present

## 2023-12-03 DIAGNOSIS — Z5111 Encounter for antineoplastic chemotherapy: Secondary | ICD-10-CM | POA: Diagnosis not present

## 2023-12-03 MED ORDER — DOXYCYCLINE HYCLATE 100 MG TABLET
ORAL_TABLET | Freq: Two times a day (BID) | ORAL | 0 refills | 30.00 days | Status: CP
Start: 2023-12-03 — End: 2023-12-03

## 2023-12-03 MED ORDER — CLINDAMYCIN HCL 300 MG CAPSULE
ORAL_CAPSULE | Freq: Three times a day (TID) | ORAL | 0 refills | 14.00 days | Status: CP
Start: 2023-12-03 — End: 2023-12-17

## 2023-12-04 DIAGNOSIS — Z125 Encounter for screening for malignant neoplasm of prostate: Principal | ICD-10-CM

## 2023-12-04 DIAGNOSIS — Z79899 Other long term (current) drug therapy: Principal | ICD-10-CM

## 2023-12-06 DIAGNOSIS — Z79899 Other long term (current) drug therapy: Principal | ICD-10-CM

## 2023-12-06 DIAGNOSIS — Z125 Encounter for screening for malignant neoplasm of prostate: Principal | ICD-10-CM

## 2023-12-06 MED ORDER — CLINDAMYCIN HCL 300 MG CAPSULE
ORAL_CAPSULE | Freq: Three times a day (TID) | ORAL | 0 refills | 14.00 days | Status: CP
Start: 2023-12-06 — End: 2023-12-20

## 2023-12-10 ENCOUNTER — Other Ambulatory Visit: Admit: 2023-12-10 | Discharge: 2023-12-11 | Payer: PRIVATE HEALTH INSURANCE

## 2023-12-10 ENCOUNTER — Inpatient Hospital Stay: Admit: 2023-12-10 | Discharge: 2023-12-11 | Payer: PRIVATE HEALTH INSURANCE

## 2023-12-10 ENCOUNTER — Ambulatory Visit: Admit: 2023-12-10 | Discharge: 2023-12-11 | Payer: PRIVATE HEALTH INSURANCE

## 2023-12-10 DIAGNOSIS — Z125 Encounter for screening for malignant neoplasm of prostate: Principal | ICD-10-CM

## 2023-12-10 DIAGNOSIS — Z79899 Other long term (current) drug therapy: Principal | ICD-10-CM

## 2023-12-10 DIAGNOSIS — C8400 Mycosis fungoides, unspecified site: Principal | ICD-10-CM

## 2023-12-10 DIAGNOSIS — Z923 Personal history of irradiation: Secondary | ICD-10-CM | POA: Diagnosis not present

## 2023-12-10 DIAGNOSIS — S81002A Unspecified open wound, left knee, initial encounter: Secondary | ICD-10-CM | POA: Diagnosis not present

## 2023-12-10 DIAGNOSIS — Z5111 Encounter for antineoplastic chemotherapy: Secondary | ICD-10-CM | POA: Diagnosis not present

## 2023-12-10 DIAGNOSIS — S81001A Unspecified open wound, right knee, initial encounter: Secondary | ICD-10-CM | POA: Diagnosis not present

## 2023-12-10 DIAGNOSIS — R911 Solitary pulmonary nodule: Secondary | ICD-10-CM | POA: Diagnosis not present

## 2023-12-10 DIAGNOSIS — Z7982 Long term (current) use of aspirin: Secondary | ICD-10-CM | POA: Diagnosis not present

## 2023-12-10 DIAGNOSIS — M542 Cervicalgia: Secondary | ICD-10-CM | POA: Diagnosis not present

## 2023-12-10 DIAGNOSIS — Z792 Long term (current) use of antibiotics: Secondary | ICD-10-CM | POA: Diagnosis not present

## 2023-12-10 MED ORDER — GABAPENTIN 100 MG CAPSULE
ORAL_CAPSULE | Freq: Three times a day (TID) | ORAL | 0 refills | 30.00 days | Status: CP
Start: 2023-12-10 — End: 2024-01-09

## 2023-12-11 DIAGNOSIS — Z125 Encounter for screening for malignant neoplasm of prostate: Principal | ICD-10-CM

## 2023-12-11 DIAGNOSIS — Z79899 Other long term (current) drug therapy: Principal | ICD-10-CM

## 2023-12-13 DIAGNOSIS — Z79899 Other long term (current) drug therapy: Principal | ICD-10-CM

## 2023-12-13 DIAGNOSIS — Z125 Encounter for screening for malignant neoplasm of prostate: Principal | ICD-10-CM

## 2023-12-14 DIAGNOSIS — Z79899 Other long term (current) drug therapy: Principal | ICD-10-CM

## 2023-12-14 DIAGNOSIS — Z125 Encounter for screening for malignant neoplasm of prostate: Principal | ICD-10-CM

## 2023-12-17 DIAGNOSIS — Z79899 Other long term (current) drug therapy: Principal | ICD-10-CM

## 2023-12-17 DIAGNOSIS — Z125 Encounter for screening for malignant neoplasm of prostate: Principal | ICD-10-CM

## 2023-12-17 DIAGNOSIS — C8400 Mycosis fungoides, unspecified site: Principal | ICD-10-CM

## 2023-12-17 MED ORDER — CLINDAMYCIN HCL 300 MG CAPSULE
ORAL_CAPSULE | Freq: Three times a day (TID) | ORAL | 0 refills | 14.00 days | Status: CP
Start: 2023-12-17 — End: 2023-12-31

## 2023-12-23 DIAGNOSIS — Z125 Encounter for screening for malignant neoplasm of prostate: Principal | ICD-10-CM

## 2023-12-23 DIAGNOSIS — Z79899 Other long term (current) drug therapy: Principal | ICD-10-CM

## 2023-12-24 ENCOUNTER — Ambulatory Visit: Admit: 2023-12-24 | Discharge: 2023-12-25

## 2023-12-24 ENCOUNTER — Inpatient Hospital Stay: Admit: 2023-12-24 | Discharge: 2023-12-25

## 2023-12-24 DIAGNOSIS — Z79899 Other long term (current) drug therapy: Principal | ICD-10-CM

## 2023-12-24 DIAGNOSIS — Z125 Encounter for screening for malignant neoplasm of prostate: Principal | ICD-10-CM

## 2023-12-24 DIAGNOSIS — C8400 Mycosis fungoides, unspecified site: Secondary | ICD-10-CM | POA: Diagnosis not present

## 2023-12-24 DIAGNOSIS — R911 Solitary pulmonary nodule: Secondary | ICD-10-CM | POA: Diagnosis not present

## 2023-12-24 DIAGNOSIS — S81001A Unspecified open wound, right knee, initial encounter: Secondary | ICD-10-CM | POA: Diagnosis not present

## 2023-12-24 DIAGNOSIS — Z5111 Encounter for antineoplastic chemotherapy: Secondary | ICD-10-CM | POA: Diagnosis not present

## 2023-12-24 DIAGNOSIS — Z923 Personal history of irradiation: Secondary | ICD-10-CM | POA: Diagnosis not present

## 2023-12-24 DIAGNOSIS — Z792 Long term (current) use of antibiotics: Secondary | ICD-10-CM | POA: Diagnosis not present

## 2023-12-24 DIAGNOSIS — Z7982 Long term (current) use of aspirin: Secondary | ICD-10-CM | POA: Diagnosis not present

## 2023-12-24 DIAGNOSIS — S81002A Unspecified open wound, left knee, initial encounter: Secondary | ICD-10-CM | POA: Diagnosis not present

## 2023-12-24 MED ORDER — CLOBETASOL 0.05 % TOPICAL CREAM
Freq: Two times a day (BID) | TOPICAL | 2 refills | 0 days | Status: CP
Start: 2023-12-24 — End: 2024-12-23

## 2023-12-24 MED ORDER — FLUTICASONE PROPIONATE 50 MCG/ACTUATION NASAL SPRAY,SUSPENSION
Freq: Every day | NASAL | 0 refills | 60 days | Status: CP
Start: 2023-12-24 — End: 2024-12-23

## 2023-12-24 MED ORDER — TRIAMCINOLONE ACETONIDE 0.1 % TOPICAL OINTMENT
Freq: Two times a day (BID) | TOPICAL | 2 refills | 0 days | Status: CP
Start: 2023-12-24 — End: ?

## 2023-12-24 MED ORDER — CLINDAMYCIN HCL 300 MG CAPSULE
ORAL_CAPSULE | Freq: Three times a day (TID) | ORAL | 0 refills | 30 days | Status: CP
Start: 2023-12-24 — End: 2024-01-23

## 2023-12-27 DIAGNOSIS — Z125 Encounter for screening for malignant neoplasm of prostate: Principal | ICD-10-CM

## 2023-12-27 DIAGNOSIS — C8400 Mycosis fungoides, unspecified site: Principal | ICD-10-CM

## 2023-12-27 DIAGNOSIS — Z79899 Other long term (current) drug therapy: Principal | ICD-10-CM

## 2023-12-28 DIAGNOSIS — Z125 Encounter for screening for malignant neoplasm of prostate: Principal | ICD-10-CM

## 2023-12-28 DIAGNOSIS — Z79899 Other long term (current) drug therapy: Principal | ICD-10-CM

## 2023-12-29 ENCOUNTER — Encounter: Attending: Physician Assistant | Admitting: Physician Assistant

## 2023-12-29 DIAGNOSIS — I2510 Atherosclerotic heart disease of native coronary artery without angina pectoris: Secondary | ICD-10-CM | POA: Diagnosis not present

## 2023-12-29 DIAGNOSIS — C84A5 Cutaneous T-cell lymphoma, unspecified, lymph nodes of inguinal region and lower limb: Secondary | ICD-10-CM | POA: Diagnosis not present

## 2023-12-29 DIAGNOSIS — I10 Essential (primary) hypertension: Secondary | ICD-10-CM | POA: Insufficient documentation

## 2023-12-29 DIAGNOSIS — C8408 Mycosis fungoides, lymph nodes of multiple sites: Secondary | ICD-10-CM | POA: Diagnosis not present

## 2023-12-29 DIAGNOSIS — L97812 Non-pressure chronic ulcer of other part of right lower leg with fat layer exposed: Secondary | ICD-10-CM | POA: Diagnosis not present

## 2023-12-29 DIAGNOSIS — L97822 Non-pressure chronic ulcer of other part of left lower leg with fat layer exposed: Secondary | ICD-10-CM | POA: Diagnosis not present

## 2023-12-29 DIAGNOSIS — C9150 Adult T-cell lymphoma/leukemia (HTLV-1-associated) not having achieved remission: Secondary | ICD-10-CM | POA: Diagnosis not present

## 2023-12-30 DIAGNOSIS — Z79899 Other long term (current) drug therapy: Principal | ICD-10-CM

## 2023-12-30 DIAGNOSIS — Z125 Encounter for screening for malignant neoplasm of prostate: Principal | ICD-10-CM

## 2023-12-31 ENCOUNTER — Inpatient Hospital Stay: Admit: 2023-12-31 | Discharge: 2024-01-01

## 2023-12-31 ENCOUNTER — Other Ambulatory Visit: Admit: 2023-12-31 | Discharge: 2024-01-01

## 2023-12-31 DIAGNOSIS — C8400 Mycosis fungoides, unspecified site: Principal | ICD-10-CM

## 2023-12-31 DIAGNOSIS — Z79899 Other long term (current) drug therapy: Principal | ICD-10-CM

## 2023-12-31 DIAGNOSIS — Z125 Encounter for screening for malignant neoplasm of prostate: Principal | ICD-10-CM

## 2023-12-31 DIAGNOSIS — Z5111 Encounter for antineoplastic chemotherapy: Secondary | ICD-10-CM | POA: Diagnosis not present

## 2024-01-01 DIAGNOSIS — Z79899 Other long term (current) drug therapy: Principal | ICD-10-CM

## 2024-01-01 DIAGNOSIS — Z125 Encounter for screening for malignant neoplasm of prostate: Principal | ICD-10-CM

## 2024-01-01 DIAGNOSIS — L97822 Non-pressure chronic ulcer of other part of left lower leg with fat layer exposed: Secondary | ICD-10-CM | POA: Diagnosis not present

## 2024-01-01 DIAGNOSIS — I2510 Atherosclerotic heart disease of native coronary artery without angina pectoris: Secondary | ICD-10-CM | POA: Diagnosis not present

## 2024-01-01 DIAGNOSIS — Z792 Long term (current) use of antibiotics: Secondary | ICD-10-CM | POA: Diagnosis not present

## 2024-01-01 DIAGNOSIS — Z7982 Long term (current) use of aspirin: Secondary | ICD-10-CM | POA: Diagnosis not present

## 2024-01-01 DIAGNOSIS — C9150 Adult T-cell lymphoma/leukemia (HTLV-1-associated) not having achieved remission: Secondary | ICD-10-CM | POA: Diagnosis not present

## 2024-01-01 DIAGNOSIS — L97811 Non-pressure chronic ulcer of other part of right lower leg limited to breakdown of skin: Secondary | ICD-10-CM | POA: Diagnosis not present

## 2024-01-01 DIAGNOSIS — Z9181 History of falling: Secondary | ICD-10-CM | POA: Diagnosis not present

## 2024-01-01 DIAGNOSIS — I10 Essential (primary) hypertension: Secondary | ICD-10-CM | POA: Diagnosis not present

## 2024-01-04 DIAGNOSIS — Z125 Encounter for screening for malignant neoplasm of prostate: Principal | ICD-10-CM

## 2024-01-04 DIAGNOSIS — Z79899 Other long term (current) drug therapy: Principal | ICD-10-CM

## 2024-01-05 ENCOUNTER — Encounter: Admitting: Physician Assistant

## 2024-01-05 DIAGNOSIS — L97812 Non-pressure chronic ulcer of other part of right lower leg with fat layer exposed: Secondary | ICD-10-CM | POA: Diagnosis not present

## 2024-01-05 DIAGNOSIS — L97823 Non-pressure chronic ulcer of other part of left lower leg with necrosis of muscle: Secondary | ICD-10-CM | POA: Diagnosis not present

## 2024-01-05 DIAGNOSIS — C9150 Adult T-cell lymphoma/leukemia (HTLV-1-associated) not having achieved remission: Secondary | ICD-10-CM | POA: Diagnosis not present

## 2024-01-05 DIAGNOSIS — C84A5 Cutaneous T-cell lymphoma, unspecified, lymph nodes of inguinal region and lower limb: Secondary | ICD-10-CM | POA: Diagnosis not present

## 2024-01-05 DIAGNOSIS — C8408 Mycosis fungoides, lymph nodes of multiple sites: Secondary | ICD-10-CM | POA: Diagnosis not present

## 2024-01-06 DIAGNOSIS — S81002A Unspecified open wound, left knee, initial encounter: Secondary | ICD-10-CM | POA: Diagnosis not present

## 2024-01-06 DIAGNOSIS — S81801A Unspecified open wound, right lower leg, initial encounter: Secondary | ICD-10-CM | POA: Diagnosis not present

## 2024-01-07 ENCOUNTER — Other Ambulatory Visit: Admit: 2024-01-07 | Discharge: 2024-01-08 | Payer: Medicare (Managed Care)

## 2024-01-07 ENCOUNTER — Inpatient Hospital Stay: Admit: 2024-01-07 | Discharge: 2024-01-08 | Payer: Medicare (Managed Care)

## 2024-01-07 DIAGNOSIS — Z79899 Other long term (current) drug therapy: Secondary | ICD-10-CM | POA: Diagnosis not present

## 2024-01-07 DIAGNOSIS — C8400 Mycosis fungoides, unspecified site: Secondary | ICD-10-CM | POA: Diagnosis not present

## 2024-01-07 DIAGNOSIS — Z125 Encounter for screening for malignant neoplasm of prostate: Secondary | ICD-10-CM | POA: Diagnosis not present

## 2024-01-07 DIAGNOSIS — Z5111 Encounter for antineoplastic chemotherapy: Secondary | ICD-10-CM | POA: Diagnosis not present

## 2024-01-07 MED ORDER — ERYTHROMYCIN 5 MG/GRAM (0.5 %) EYE OINTMENT
Freq: Four times a day (QID) | OPHTHALMIC | 0 refills | 0.00000 days | Status: CP
Start: 2024-01-07 — End: ?

## 2024-01-08 DIAGNOSIS — Z125 Encounter for screening for malignant neoplasm of prostate: Principal | ICD-10-CM

## 2024-01-08 DIAGNOSIS — Z79899 Other long term (current) drug therapy: Principal | ICD-10-CM

## 2024-01-10 DIAGNOSIS — H01001 Unspecified blepharitis right upper eyelid: Secondary | ICD-10-CM | POA: Diagnosis not present

## 2024-01-10 DIAGNOSIS — I10 Essential (primary) hypertension: Secondary | ICD-10-CM | POA: Diagnosis not present

## 2024-01-10 DIAGNOSIS — Z9181 History of falling: Secondary | ICD-10-CM | POA: Diagnosis not present

## 2024-01-10 DIAGNOSIS — L97811 Non-pressure chronic ulcer of other part of right lower leg limited to breakdown of skin: Secondary | ICD-10-CM | POA: Diagnosis not present

## 2024-01-10 DIAGNOSIS — C9150 Adult T-cell lymphoma/leukemia (HTLV-1-associated) not having achieved remission: Secondary | ICD-10-CM | POA: Diagnosis not present

## 2024-01-10 DIAGNOSIS — I2510 Atherosclerotic heart disease of native coronary artery without angina pectoris: Secondary | ICD-10-CM | POA: Diagnosis not present

## 2024-01-10 DIAGNOSIS — Z7982 Long term (current) use of aspirin: Secondary | ICD-10-CM | POA: Diagnosis not present

## 2024-01-10 DIAGNOSIS — L97822 Non-pressure chronic ulcer of other part of left lower leg with fat layer exposed: Secondary | ICD-10-CM | POA: Diagnosis not present

## 2024-01-11 ENCOUNTER — Ambulatory Visit: Admitting: Physician Assistant

## 2024-01-11 DIAGNOSIS — Z125 Encounter for screening for malignant neoplasm of prostate: Principal | ICD-10-CM

## 2024-01-11 DIAGNOSIS — Z79899 Other long term (current) drug therapy: Principal | ICD-10-CM

## 2024-01-12 ENCOUNTER — Encounter: Admitting: Physician Assistant

## 2024-01-12 DIAGNOSIS — Z79899 Other long term (current) drug therapy: Principal | ICD-10-CM

## 2024-01-12 DIAGNOSIS — Z125 Encounter for screening for malignant neoplasm of prostate: Principal | ICD-10-CM

## 2024-01-12 DIAGNOSIS — C8408 Mycosis fungoides, lymph nodes of multiple sites: Secondary | ICD-10-CM | POA: Diagnosis not present

## 2024-01-12 DIAGNOSIS — C84A5 Cutaneous T-cell lymphoma, unspecified, lymph nodes of inguinal region and lower limb: Secondary | ICD-10-CM | POA: Diagnosis not present

## 2024-01-12 DIAGNOSIS — L97812 Non-pressure chronic ulcer of other part of right lower leg with fat layer exposed: Secondary | ICD-10-CM | POA: Diagnosis not present

## 2024-01-12 DIAGNOSIS — L97822 Non-pressure chronic ulcer of other part of left lower leg with fat layer exposed: Secondary | ICD-10-CM | POA: Diagnosis not present

## 2024-01-12 DIAGNOSIS — C9150 Adult T-cell lymphoma/leukemia (HTLV-1-associated) not having achieved remission: Secondary | ICD-10-CM | POA: Diagnosis not present

## 2024-01-13 ENCOUNTER — Ambulatory Visit: Admitting: Physician Assistant

## 2024-01-18 DIAGNOSIS — D649 Anemia, unspecified: Secondary | ICD-10-CM | POA: Diagnosis not present

## 2024-01-18 DIAGNOSIS — I10 Essential (primary) hypertension: Secondary | ICD-10-CM | POA: Diagnosis not present

## 2024-01-18 DIAGNOSIS — Z955 Presence of coronary angioplasty implant and graft: Secondary | ICD-10-CM | POA: Diagnosis not present

## 2024-01-18 DIAGNOSIS — L97929 Non-pressure chronic ulcer of unspecified part of left lower leg with unspecified severity: Secondary | ICD-10-CM | POA: Diagnosis not present

## 2024-01-18 DIAGNOSIS — G4733 Obstructive sleep apnea (adult) (pediatric): Secondary | ICD-10-CM | POA: Diagnosis not present

## 2024-01-18 DIAGNOSIS — R600 Localized edema: Secondary | ICD-10-CM | POA: Diagnosis not present

## 2024-01-18 DIAGNOSIS — L97919 Non-pressure chronic ulcer of unspecified part of right lower leg with unspecified severity: Secondary | ICD-10-CM | POA: Diagnosis not present

## 2024-01-18 DIAGNOSIS — E8809 Other disorders of plasma-protein metabolism, not elsewhere classified: Secondary | ICD-10-CM | POA: Diagnosis not present

## 2024-01-18 DIAGNOSIS — C8400 Mycosis fungoides, unspecified site: Secondary | ICD-10-CM | POA: Diagnosis not present

## 2024-01-18 DIAGNOSIS — N5089 Other specified disorders of the male genital organs: Secondary | ICD-10-CM | POA: Diagnosis not present

## 2024-01-19 ENCOUNTER — Encounter: Admitting: Physician Assistant

## 2024-01-19 DIAGNOSIS — Z125 Encounter for screening for malignant neoplasm of prostate: Principal | ICD-10-CM

## 2024-01-19 DIAGNOSIS — Z79899 Other long term (current) drug therapy: Principal | ICD-10-CM

## 2024-01-19 DIAGNOSIS — L97823 Non-pressure chronic ulcer of other part of left lower leg with necrosis of muscle: Secondary | ICD-10-CM | POA: Diagnosis not present

## 2024-01-19 DIAGNOSIS — C84A5 Cutaneous T-cell lymphoma, unspecified, lymph nodes of inguinal region and lower limb: Secondary | ICD-10-CM | POA: Diagnosis not present

## 2024-01-19 DIAGNOSIS — C8408 Mycosis fungoides, lymph nodes of multiple sites: Secondary | ICD-10-CM | POA: Diagnosis not present

## 2024-01-19 DIAGNOSIS — L97812 Non-pressure chronic ulcer of other part of right lower leg with fat layer exposed: Secondary | ICD-10-CM | POA: Diagnosis not present

## 2024-01-19 DIAGNOSIS — C9150 Adult T-cell lymphoma/leukemia (HTLV-1-associated) not having achieved remission: Secondary | ICD-10-CM | POA: Diagnosis not present

## 2024-01-20 DIAGNOSIS — S81002A Unspecified open wound, left knee, initial encounter: Secondary | ICD-10-CM | POA: Diagnosis not present

## 2024-01-20 DIAGNOSIS — S81801A Unspecified open wound, right lower leg, initial encounter: Secondary | ICD-10-CM | POA: Diagnosis not present

## 2024-01-21 ENCOUNTER — Ambulatory Visit: Admit: 2024-01-21 | Discharge: 2024-01-22 | Payer: Medicare (Managed Care)

## 2024-01-21 ENCOUNTER — Inpatient Hospital Stay: Admit: 2024-01-21 | Discharge: 2024-01-22 | Payer: Medicare (Managed Care)

## 2024-01-21 ENCOUNTER — Other Ambulatory Visit: Admit: 2024-01-21 | Discharge: 2024-01-22 | Payer: Medicare (Managed Care)

## 2024-01-21 DIAGNOSIS — Z125 Encounter for screening for malignant neoplasm of prostate: Principal | ICD-10-CM

## 2024-01-21 DIAGNOSIS — C8400 Mycosis fungoides, unspecified site: Principal | ICD-10-CM

## 2024-01-21 DIAGNOSIS — Z79899 Other long term (current) drug therapy: Principal | ICD-10-CM

## 2024-01-21 DIAGNOSIS — N5089 Other specified disorders of the male genital organs: Secondary | ICD-10-CM | POA: Diagnosis not present

## 2024-01-21 DIAGNOSIS — Z7982 Long term (current) use of aspirin: Secondary | ICD-10-CM | POA: Diagnosis not present

## 2024-01-21 DIAGNOSIS — S81802A Unspecified open wound, left lower leg, initial encounter: Secondary | ICD-10-CM | POA: Diagnosis not present

## 2024-01-21 DIAGNOSIS — R911 Solitary pulmonary nodule: Secondary | ICD-10-CM | POA: Diagnosis not present

## 2024-01-21 DIAGNOSIS — L989 Disorder of the skin and subcutaneous tissue, unspecified: Secondary | ICD-10-CM | POA: Diagnosis not present

## 2024-01-21 DIAGNOSIS — Z5111 Encounter for antineoplastic chemotherapy: Secondary | ICD-10-CM | POA: Diagnosis not present

## 2024-01-21 DIAGNOSIS — D649 Anemia, unspecified: Secondary | ICD-10-CM | POA: Diagnosis not present

## 2024-01-21 DIAGNOSIS — R600 Localized edema: Secondary | ICD-10-CM | POA: Diagnosis not present

## 2024-01-21 DIAGNOSIS — Z923 Personal history of irradiation: Secondary | ICD-10-CM | POA: Diagnosis not present

## 2024-01-22 DIAGNOSIS — Z125 Encounter for screening for malignant neoplasm of prostate: Principal | ICD-10-CM

## 2024-01-22 DIAGNOSIS — Z79899 Other long term (current) drug therapy: Principal | ICD-10-CM

## 2024-01-24 DIAGNOSIS — M9905 Segmental and somatic dysfunction of pelvic region: Secondary | ICD-10-CM | POA: Diagnosis not present

## 2024-01-24 DIAGNOSIS — M9903 Segmental and somatic dysfunction of lumbar region: Secondary | ICD-10-CM | POA: Diagnosis not present

## 2024-01-24 DIAGNOSIS — M955 Acquired deformity of pelvis: Secondary | ICD-10-CM | POA: Diagnosis not present

## 2024-01-24 DIAGNOSIS — M5136 Other intervertebral disc degeneration, lumbar region with discogenic back pain only: Secondary | ICD-10-CM | POA: Diagnosis not present

## 2024-01-26 ENCOUNTER — Encounter: Admitting: Physician Assistant

## 2024-01-26 DIAGNOSIS — M5136 Other intervertebral disc degeneration, lumbar region with discogenic back pain only: Secondary | ICD-10-CM | POA: Diagnosis not present

## 2024-01-26 DIAGNOSIS — I10 Essential (primary) hypertension: Secondary | ICD-10-CM | POA: Diagnosis not present

## 2024-01-26 DIAGNOSIS — M955 Acquired deformity of pelvis: Secondary | ICD-10-CM | POA: Diagnosis not present

## 2024-01-26 DIAGNOSIS — L97812 Non-pressure chronic ulcer of other part of right lower leg with fat layer exposed: Secondary | ICD-10-CM | POA: Diagnosis not present

## 2024-01-26 DIAGNOSIS — I4891 Unspecified atrial fibrillation: Secondary | ICD-10-CM | POA: Diagnosis not present

## 2024-01-26 DIAGNOSIS — C84A5 Cutaneous T-cell lymphoma, unspecified, lymph nodes of inguinal region and lower limb: Secondary | ICD-10-CM | POA: Diagnosis not present

## 2024-01-26 DIAGNOSIS — G4733 Obstructive sleep apnea (adult) (pediatric): Secondary | ICD-10-CM | POA: Diagnosis not present

## 2024-01-26 DIAGNOSIS — C9150 Adult T-cell lymphoma/leukemia (HTLV-1-associated) not having achieved remission: Secondary | ICD-10-CM | POA: Diagnosis not present

## 2024-01-26 DIAGNOSIS — M1990 Unspecified osteoarthritis, unspecified site: Secondary | ICD-10-CM | POA: Diagnosis not present

## 2024-01-26 DIAGNOSIS — E669 Obesity, unspecified: Secondary | ICD-10-CM | POA: Diagnosis not present

## 2024-01-26 DIAGNOSIS — E782 Mixed hyperlipidemia: Secondary | ICD-10-CM | POA: Diagnosis not present

## 2024-01-26 DIAGNOSIS — C8408 Mycosis fungoides, lymph nodes of multiple sites: Secondary | ICD-10-CM | POA: Diagnosis not present

## 2024-01-26 DIAGNOSIS — M9905 Segmental and somatic dysfunction of pelvic region: Secondary | ICD-10-CM | POA: Diagnosis not present

## 2024-01-26 DIAGNOSIS — R0789 Other chest pain: Secondary | ICD-10-CM | POA: Diagnosis not present

## 2024-01-26 DIAGNOSIS — I2510 Atherosclerotic heart disease of native coronary artery without angina pectoris: Secondary | ICD-10-CM | POA: Diagnosis not present

## 2024-01-26 DIAGNOSIS — R001 Bradycardia, unspecified: Secondary | ICD-10-CM | POA: Diagnosis not present

## 2024-01-26 DIAGNOSIS — Z955 Presence of coronary angioplasty implant and graft: Secondary | ICD-10-CM | POA: Diagnosis not present

## 2024-01-26 DIAGNOSIS — R0602 Shortness of breath: Secondary | ICD-10-CM | POA: Diagnosis not present

## 2024-01-26 DIAGNOSIS — K219 Gastro-esophageal reflux disease without esophagitis: Secondary | ICD-10-CM | POA: Diagnosis not present

## 2024-01-26 DIAGNOSIS — M9903 Segmental and somatic dysfunction of lumbar region: Secondary | ICD-10-CM | POA: Diagnosis not present

## 2024-01-26 DIAGNOSIS — L97823 Non-pressure chronic ulcer of other part of left lower leg with necrosis of muscle: Secondary | ICD-10-CM | POA: Diagnosis not present

## 2024-01-27 ENCOUNTER — Other Ambulatory Visit: Payer: Self-pay | Admitting: Internal Medicine

## 2024-01-27 DIAGNOSIS — Z79899 Other long term (current) drug therapy: Principal | ICD-10-CM

## 2024-01-27 DIAGNOSIS — Z125 Encounter for screening for malignant neoplasm of prostate: Principal | ICD-10-CM

## 2024-01-27 DIAGNOSIS — R0789 Other chest pain: Secondary | ICD-10-CM

## 2024-01-28 ENCOUNTER — Inpatient Hospital Stay: Admit: 2024-01-28 | Discharge: 2024-01-29 | Payer: Medicare (Managed Care)

## 2024-01-28 ENCOUNTER — Other Ambulatory Visit: Admit: 2024-01-28 | Discharge: 2024-01-29 | Payer: Medicare (Managed Care)

## 2024-01-28 DIAGNOSIS — C8400 Mycosis fungoides, unspecified site: Principal | ICD-10-CM

## 2024-01-28 DIAGNOSIS — Z125 Encounter for screening for malignant neoplasm of prostate: Principal | ICD-10-CM

## 2024-01-28 DIAGNOSIS — Z79899 Other long term (current) drug therapy: Principal | ICD-10-CM

## 2024-01-28 DIAGNOSIS — Z5111 Encounter for antineoplastic chemotherapy: Secondary | ICD-10-CM | POA: Diagnosis not present

## 2024-01-31 DIAGNOSIS — M9903 Segmental and somatic dysfunction of lumbar region: Secondary | ICD-10-CM | POA: Diagnosis not present

## 2024-01-31 DIAGNOSIS — M9905 Segmental and somatic dysfunction of pelvic region: Secondary | ICD-10-CM | POA: Diagnosis not present

## 2024-01-31 DIAGNOSIS — M955 Acquired deformity of pelvis: Secondary | ICD-10-CM | POA: Diagnosis not present

## 2024-01-31 DIAGNOSIS — M5136 Other intervertebral disc degeneration, lumbar region with discogenic back pain only: Secondary | ICD-10-CM | POA: Diagnosis not present

## 2024-02-01 ENCOUNTER — Ambulatory Visit
Admission: RE | Admit: 2024-02-01 | Discharge: 2024-02-01 | Disposition: A | Discharge status: Home / Self Care | Source: Ambulatory Visit | Attending: Internal Medicine | Admitting: Internal Medicine

## 2024-02-01 DIAGNOSIS — Z125 Encounter for screening for malignant neoplasm of prostate: Principal | ICD-10-CM

## 2024-02-01 DIAGNOSIS — Z79899 Other long term (current) drug therapy: Principal | ICD-10-CM

## 2024-02-01 DIAGNOSIS — R0789 Other chest pain: Secondary | ICD-10-CM | POA: Insufficient documentation

## 2024-02-01 DIAGNOSIS — Z8572 Personal history of non-Hodgkin lymphomas: Secondary | ICD-10-CM | POA: Diagnosis not present

## 2024-02-01 DIAGNOSIS — I700 Atherosclerosis of aorta: Secondary | ICD-10-CM | POA: Diagnosis not present

## 2024-02-01 DIAGNOSIS — R918 Other nonspecific abnormal finding of lung field: Secondary | ICD-10-CM | POA: Diagnosis not present

## 2024-02-01 MED ORDER — IOHEXOL 300 MG/ML  SOLN
75.0000 mL | Freq: Once | INTRAMUSCULAR | Status: AC | PRN
  Administered 2024-02-01: 75 mL via INTRAVENOUS

## 2024-02-02 ENCOUNTER — Encounter: Attending: Physician Assistant | Admitting: Physician Assistant

## 2024-02-02 DIAGNOSIS — I2510 Atherosclerotic heart disease of native coronary artery without angina pectoris: Secondary | ICD-10-CM | POA: Insufficient documentation

## 2024-02-02 DIAGNOSIS — I10 Essential (primary) hypertension: Secondary | ICD-10-CM | POA: Insufficient documentation

## 2024-02-02 DIAGNOSIS — C9150 Adult T-cell lymphoma/leukemia (HTLV-1-associated) not having achieved remission: Secondary | ICD-10-CM | POA: Insufficient documentation

## 2024-02-02 DIAGNOSIS — C8408 Mycosis fungoides, lymph nodes of multiple sites: Secondary | ICD-10-CM | POA: Insufficient documentation

## 2024-02-02 DIAGNOSIS — L97812 Non-pressure chronic ulcer of other part of right lower leg with fat layer exposed: Secondary | ICD-10-CM | POA: Diagnosis not present

## 2024-02-02 DIAGNOSIS — L97823 Non-pressure chronic ulcer of other part of left lower leg with necrosis of muscle: Secondary | ICD-10-CM | POA: Diagnosis not present

## 2024-02-04 ENCOUNTER — Ambulatory Visit: Admit: 2024-02-04 | Discharge: 2024-02-04 | Payer: Medicare (Managed Care)

## 2024-02-04 DIAGNOSIS — T148XXA Other injury of unspecified body region, initial encounter: Principal | ICD-10-CM

## 2024-02-04 DIAGNOSIS — C8400 Mycosis fungoides, unspecified site: Principal | ICD-10-CM

## 2024-02-04 DIAGNOSIS — Z79899 Other long term (current) drug therapy: Principal | ICD-10-CM

## 2024-02-04 DIAGNOSIS — Z125 Encounter for screening for malignant neoplasm of prostate: Principal | ICD-10-CM

## 2024-02-04 DIAGNOSIS — R079 Chest pain, unspecified: Secondary | ICD-10-CM | POA: Diagnosis not present

## 2024-02-04 DIAGNOSIS — R600 Localized edema: Secondary | ICD-10-CM | POA: Diagnosis not present

## 2024-02-04 DIAGNOSIS — Z7982 Long term (current) use of aspirin: Secondary | ICD-10-CM | POA: Diagnosis not present

## 2024-02-09 ENCOUNTER — Encounter: Admitting: Physician Assistant

## 2024-02-09 DIAGNOSIS — L97823 Non-pressure chronic ulcer of other part of left lower leg with necrosis of muscle: Secondary | ICD-10-CM | POA: Diagnosis not present

## 2024-02-09 DIAGNOSIS — C9150 Adult T-cell lymphoma/leukemia (HTLV-1-associated) not having achieved remission: Secondary | ICD-10-CM | POA: Diagnosis not present

## 2024-02-09 DIAGNOSIS — C8408 Mycosis fungoides, lymph nodes of multiple sites: Secondary | ICD-10-CM | POA: Diagnosis not present

## 2024-02-09 MED ORDER — BEXAROTENE 75 MG CAPSULE
ORAL_CAPSULE | Freq: Every day | ORAL | 11 refills | 30.00000 days | Status: CP
Start: 2024-02-09 — End: 2025-02-08

## 2024-02-10 DIAGNOSIS — C8400 Mycosis fungoides, unspecified site: Principal | ICD-10-CM

## 2024-02-11 DIAGNOSIS — Z125 Encounter for screening for malignant neoplasm of prostate: Principal | ICD-10-CM

## 2024-02-11 DIAGNOSIS — Z79899 Other long term (current) drug therapy: Principal | ICD-10-CM

## 2024-02-11 DIAGNOSIS — J22 Unspecified acute lower respiratory infection: Secondary | ICD-10-CM | POA: Diagnosis not present

## 2024-02-11 DIAGNOSIS — R051 Acute cough: Secondary | ICD-10-CM | POA: Diagnosis not present

## 2024-02-15 DIAGNOSIS — R0789 Other chest pain: Secondary | ICD-10-CM | POA: Diagnosis not present

## 2024-02-16 ENCOUNTER — Encounter: Admitting: Physician Assistant

## 2024-02-16 DIAGNOSIS — L97823 Non-pressure chronic ulcer of other part of left lower leg with necrosis of muscle: Secondary | ICD-10-CM | POA: Diagnosis not present

## 2024-02-16 DIAGNOSIS — C8408 Mycosis fungoides, lymph nodes of multiple sites: Secondary | ICD-10-CM | POA: Diagnosis not present

## 2024-02-16 DIAGNOSIS — C9150 Adult T-cell lymphoma/leukemia (HTLV-1-associated) not having achieved remission: Secondary | ICD-10-CM | POA: Diagnosis not present

## 2024-02-18 DIAGNOSIS — S81801A Unspecified open wound, right lower leg, initial encounter: Secondary | ICD-10-CM | POA: Diagnosis not present

## 2024-02-18 DIAGNOSIS — S81002A Unspecified open wound, left knee, initial encounter: Secondary | ICD-10-CM | POA: Diagnosis not present

## 2024-02-23 ENCOUNTER — Encounter: Admitting: Physician Assistant

## 2024-02-23 DIAGNOSIS — L97823 Non-pressure chronic ulcer of other part of left lower leg with necrosis of muscle: Secondary | ICD-10-CM | POA: Diagnosis not present

## 2024-02-23 DIAGNOSIS — C9150 Adult T-cell lymphoma/leukemia (HTLV-1-associated) not having achieved remission: Secondary | ICD-10-CM | POA: Diagnosis not present

## 2024-02-23 DIAGNOSIS — C8408 Mycosis fungoides, lymph nodes of multiple sites: Secondary | ICD-10-CM | POA: Diagnosis not present

## 2024-02-24 ENCOUNTER — Ambulatory Visit
Admission: RE | Admit: 2024-02-24 | Discharge: 2024-02-24 | Disposition: A | Discharge status: Home / Self Care | Source: Ambulatory Visit | Attending: Internal Medicine | Admitting: Internal Medicine

## 2024-02-24 DIAGNOSIS — R0789 Other chest pain: Secondary | ICD-10-CM | POA: Diagnosis not present

## 2024-02-24 LAB — NM MYOCAR MULTI W/SPECT W/WALL MOTION / EF
Estimated workload: 1
Exercise duration (min): 1 min
Exercise duration (sec): 0 s
LV dias vol: 88 mL (ref 62–150)
LV sys vol: 31 mL
MPHR: 143 {beats}/min
Nuc Stress EF: 65 %
Peak HR: 78 {beats}/min
Percent HR: 54 %
Rest HR: 61 {beats}/min
Rest Nuclear Isotope Dose: 10.8 mCi
SDS: 0
SRS: 1
SSS: 0
ST Depression (mm): 0 mm
Stress Nuclear Isotope Dose: 32.2 mCi
TID: 0.88

## 2024-02-24 MED ORDER — TECHNETIUM TC 99M TETROFOSMIN IV KIT
10.0000 | PACK | Freq: Once | INTRAVENOUS | Status: AC | PRN
  Administered 2024-02-24: 10.82 via INTRAVENOUS

## 2024-02-24 MED ORDER — REGADENOSON 0.4 MG/5ML IV SOLN
0.4000 mg | Freq: Once | INTRAVENOUS | Status: AC
  Administered 2024-02-24: 0.4 mg via INTRAVENOUS

## 2024-02-24 MED ORDER — TECHNETIUM TC 99M TETROFOSMIN IV KIT
32.2300 | PACK | Freq: Once | INTRAVENOUS | Status: AC | PRN
Start: 2024-02-24 — End: 2024-02-24
  Administered 2024-02-24: 32.23 via INTRAVENOUS

## 2024-03-01 ENCOUNTER — Encounter: Attending: Physician Assistant | Admitting: Physician Assistant

## 2024-03-01 DIAGNOSIS — I10 Essential (primary) hypertension: Secondary | ICD-10-CM | POA: Diagnosis not present

## 2024-03-01 DIAGNOSIS — R0602 Shortness of breath: Secondary | ICD-10-CM | POA: Diagnosis not present

## 2024-03-01 DIAGNOSIS — L97823 Non-pressure chronic ulcer of other part of left lower leg with necrosis of muscle: Secondary | ICD-10-CM | POA: Insufficient documentation

## 2024-03-01 DIAGNOSIS — E669 Obesity, unspecified: Secondary | ICD-10-CM | POA: Diagnosis not present

## 2024-03-01 DIAGNOSIS — L97812 Non-pressure chronic ulcer of other part of right lower leg with fat layer exposed: Secondary | ICD-10-CM | POA: Diagnosis not present

## 2024-03-01 DIAGNOSIS — E782 Mixed hyperlipidemia: Secondary | ICD-10-CM | POA: Diagnosis not present

## 2024-03-01 DIAGNOSIS — I2510 Atherosclerotic heart disease of native coronary artery without angina pectoris: Secondary | ICD-10-CM | POA: Diagnosis not present

## 2024-03-01 DIAGNOSIS — C8408 Mycosis fungoides, lymph nodes of multiple sites: Secondary | ICD-10-CM | POA: Insufficient documentation

## 2024-03-01 DIAGNOSIS — R21 Rash and other nonspecific skin eruption: Secondary | ICD-10-CM | POA: Diagnosis not present

## 2024-03-01 DIAGNOSIS — I4891 Unspecified atrial fibrillation: Secondary | ICD-10-CM | POA: Diagnosis not present

## 2024-03-01 DIAGNOSIS — G4733 Obstructive sleep apnea (adult) (pediatric): Secondary | ICD-10-CM | POA: Diagnosis not present

## 2024-03-01 DIAGNOSIS — R600 Localized edema: Secondary | ICD-10-CM | POA: Diagnosis not present

## 2024-03-01 DIAGNOSIS — C9150 Adult T-cell lymphoma/leukemia (HTLV-1-associated) not having achieved remission: Secondary | ICD-10-CM | POA: Insufficient documentation

## 2024-03-01 DIAGNOSIS — K219 Gastro-esophageal reflux disease without esophagitis: Secondary | ICD-10-CM | POA: Diagnosis not present

## 2024-03-01 DIAGNOSIS — R001 Bradycardia, unspecified: Secondary | ICD-10-CM | POA: Diagnosis not present

## 2024-03-01 DIAGNOSIS — Z955 Presence of coronary angioplasty implant and graft: Secondary | ICD-10-CM | POA: Diagnosis not present

## 2024-03-03 ENCOUNTER — Other Ambulatory Visit: Admit: 2024-03-03 | Discharge: 2024-03-03 | Payer: Medicare (Managed Care)

## 2024-03-03 ENCOUNTER — Ambulatory Visit: Admit: 2024-03-03 | Discharge: 2024-03-03 | Payer: Medicare (Managed Care)

## 2024-03-03 DIAGNOSIS — Z125 Encounter for screening for malignant neoplasm of prostate: Principal | ICD-10-CM

## 2024-03-03 DIAGNOSIS — C8400 Mycosis fungoides, unspecified site: Principal | ICD-10-CM

## 2024-03-03 DIAGNOSIS — Z79899 Other long term (current) drug therapy: Principal | ICD-10-CM

## 2024-03-03 MED ORDER — HYDROXYZINE HCL 25 MG TABLET
ORAL_TABLET | Freq: Three times a day (TID) | ORAL | 2 refills | 30.00000 days | Status: CP | PRN
Start: 2024-03-03 — End: ?

## 2024-03-08 ENCOUNTER — Encounter: Admitting: Physician Assistant

## 2024-03-08 DIAGNOSIS — C9150 Adult T-cell lymphoma/leukemia (HTLV-1-associated) not having achieved remission: Secondary | ICD-10-CM | POA: Diagnosis not present

## 2024-03-08 DIAGNOSIS — C8408 Mycosis fungoides, lymph nodes of multiple sites: Secondary | ICD-10-CM | POA: Diagnosis not present

## 2024-03-08 DIAGNOSIS — L97823 Non-pressure chronic ulcer of other part of left lower leg with necrosis of muscle: Secondary | ICD-10-CM | POA: Diagnosis not present

## 2024-03-09 DIAGNOSIS — Z79899 Other long term (current) drug therapy: Principal | ICD-10-CM

## 2024-03-09 DIAGNOSIS — Z125 Encounter for screening for malignant neoplasm of prostate: Principal | ICD-10-CM

## 2024-03-12 DIAGNOSIS — Z125 Encounter for screening for malignant neoplasm of prostate: Principal | ICD-10-CM

## 2024-03-12 DIAGNOSIS — Z79899 Other long term (current) drug therapy: Principal | ICD-10-CM

## 2024-03-15 ENCOUNTER — Encounter: Admitting: Internal Medicine

## 2024-03-15 DIAGNOSIS — L97823 Non-pressure chronic ulcer of other part of left lower leg with necrosis of muscle: Secondary | ICD-10-CM | POA: Diagnosis not present

## 2024-03-15 DIAGNOSIS — C9150 Adult T-cell lymphoma/leukemia (HTLV-1-associated) not having achieved remission: Secondary | ICD-10-CM | POA: Diagnosis not present

## 2024-03-15 DIAGNOSIS — C8408 Mycosis fungoides, lymph nodes of multiple sites: Secondary | ICD-10-CM | POA: Diagnosis not present

## 2024-03-17 ENCOUNTER — Ambulatory Visit: Admit: 2024-03-17 | Discharge: 2024-03-18 | Payer: Medicare (Managed Care)

## 2024-03-17 DIAGNOSIS — Z79899 Other long term (current) drug therapy: Principal | ICD-10-CM

## 2024-03-17 DIAGNOSIS — Z125 Encounter for screening for malignant neoplasm of prostate: Principal | ICD-10-CM

## 2024-03-17 DIAGNOSIS — C8400 Mycosis fungoides, unspecified site: Principal | ICD-10-CM

## 2024-03-22 ENCOUNTER — Encounter: Admitting: Physician Assistant

## 2024-03-22 DIAGNOSIS — C9150 Adult T-cell lymphoma/leukemia (HTLV-1-associated) not having achieved remission: Secondary | ICD-10-CM | POA: Diagnosis not present

## 2024-03-22 DIAGNOSIS — L97823 Non-pressure chronic ulcer of other part of left lower leg with necrosis of muscle: Secondary | ICD-10-CM | POA: Diagnosis not present

## 2024-03-22 DIAGNOSIS — C8408 Mycosis fungoides, lymph nodes of multiple sites: Secondary | ICD-10-CM | POA: Diagnosis not present

## 2024-03-24 ENCOUNTER — Telehealth: Payer: Self-pay

## 2024-03-24 DIAGNOSIS — Z79899 Other long term (current) drug therapy: Principal | ICD-10-CM

## 2024-03-24 DIAGNOSIS — Z125 Encounter for screening for malignant neoplasm of prostate: Principal | ICD-10-CM

## 2024-03-24 NOTE — Telephone Encounter (Signed)
 Nurse Rico georgie Cella stated [facility] sent over a referral for patient to have treatment done here. No appointments are scheduled. Referral coordinator advised to f/u. Please call patient with apts per nurse

## 2024-03-27 ENCOUNTER — Other Ambulatory Visit: Payer: Self-pay | Admitting: Pulmonary Disease

## 2024-03-27 DIAGNOSIS — C4490 Unspecified malignant neoplasm of skin, unspecified: Secondary | ICD-10-CM | POA: Diagnosis not present

## 2024-03-27 DIAGNOSIS — C84A0 Cutaneous T-cell lymphoma, unspecified, unspecified site: Secondary | ICD-10-CM | POA: Diagnosis not present

## 2024-03-27 DIAGNOSIS — C8400 Mycosis fungoides, unspecified site: Secondary | ICD-10-CM | POA: Diagnosis not present

## 2024-03-27 DIAGNOSIS — Z87891 Personal history of nicotine dependence: Secondary | ICD-10-CM | POA: Diagnosis not present

## 2024-03-27 DIAGNOSIS — R918 Other nonspecific abnormal finding of lung field: Secondary | ICD-10-CM | POA: Diagnosis not present

## 2024-03-27 DIAGNOSIS — S81002A Unspecified open wound, left knee, initial encounter: Secondary | ICD-10-CM | POA: Diagnosis not present

## 2024-03-27 DIAGNOSIS — S81801A Unspecified open wound, right lower leg, initial encounter: Secondary | ICD-10-CM | POA: Diagnosis not present

## 2024-03-29 ENCOUNTER — Encounter: Attending: Physician Assistant | Admitting: Physician Assistant

## 2024-03-29 ENCOUNTER — Telehealth: Payer: Self-pay | Admitting: *Deleted

## 2024-03-29 ENCOUNTER — Inpatient Hospital Stay: Attending: Internal Medicine | Admitting: Internal Medicine

## 2024-03-29 ENCOUNTER — Encounter: Payer: Self-pay | Admitting: Internal Medicine

## 2024-03-29 ENCOUNTER — Inpatient Hospital Stay

## 2024-03-29 VITALS — BP 121/60 | HR 66 | Temp 98.5°F | Resp 20 | Ht 68.0 in | Wt 183.3 lb

## 2024-03-29 DIAGNOSIS — Z5111 Encounter for antineoplastic chemotherapy: Secondary | ICD-10-CM | POA: Diagnosis not present

## 2024-03-29 DIAGNOSIS — L97812 Non-pressure chronic ulcer of other part of right lower leg with fat layer exposed: Secondary | ICD-10-CM | POA: Insufficient documentation

## 2024-03-29 DIAGNOSIS — Z87891 Personal history of nicotine dependence: Secondary | ICD-10-CM | POA: Diagnosis not present

## 2024-03-29 DIAGNOSIS — I10 Essential (primary) hypertension: Secondary | ICD-10-CM | POA: Diagnosis not present

## 2024-03-29 DIAGNOSIS — C9150 Adult T-cell lymphoma/leukemia (HTLV-1-associated) not having achieved remission: Secondary | ICD-10-CM | POA: Diagnosis not present

## 2024-03-29 DIAGNOSIS — L97823 Non-pressure chronic ulcer of other part of left lower leg with necrosis of muscle: Secondary | ICD-10-CM | POA: Diagnosis not present

## 2024-03-29 DIAGNOSIS — R911 Solitary pulmonary nodule: Secondary | ICD-10-CM

## 2024-03-29 DIAGNOSIS — C8408 Mycosis fungoides, lymph nodes of multiple sites: Secondary | ICD-10-CM | POA: Diagnosis not present

## 2024-03-29 DIAGNOSIS — I2510 Atherosclerotic heart disease of native coronary artery without angina pectoris: Secondary | ICD-10-CM | POA: Diagnosis not present

## 2024-03-29 DIAGNOSIS — Z79899 Other long term (current) drug therapy: Secondary | ICD-10-CM | POA: Insufficient documentation

## 2024-03-29 DIAGNOSIS — C8400 Mycosis fungoides, unspecified site: Secondary | ICD-10-CM | POA: Diagnosis not present

## 2024-03-29 MED ORDER — PROCHLORPERAZINE MALEATE 10 MG PO TABS: 10.0000 mg | ORAL_TABLET | Freq: Four times a day (QID) | ORAL | 1 refills | Status: DC | PRN

## 2024-03-29 MED ORDER — FOLIC ACID 1 MG PO TABS: 1.0000 mg | ORAL_TABLET | Freq: Every day | ORAL | 6 refills | Status: DC

## 2024-03-29 MED ORDER — ONDANSETRON HCL 8 MG PO TABS: 8.0000 mg | ORAL_TABLET | Freq: Three times a day (TID) | ORAL | 1 refills | Status: DC | PRN

## 2024-03-29 NOTE — Progress Notes (Signed)
 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

## 2024-03-29 NOTE — Progress Notes (Signed)
 C/o itching all over, rash/wounds for months. On 3 topicals daily. Sees wound care at [facility].  C/o hands numbness x6 weeks.  Concerned with doing infusions due to having trouble getting his veins. [facility] would not place port due to rash. If possible he would like to have a port placed.

## 2024-03-29 NOTE — H&P (View-Only) (Signed)
 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 08/2022 09/2023 - progressive tumors - biopsy of skin cd30-, CD5+, CD4-/CD8- - no large cell transformation  10/2023-01/28/2024 - gemcitabine  x 4 cycles --> The tumors resolved with gemcitabine  but patch/plaque disease on his skin elsewhere progressed 03/03/24 - bexarotene 225mg /day   Mycosis fungoides, unspecified site (HCC)  05/24/2012 Initial Diagnosis   Mycosis fungoides, unspecified site (HCC)   04/03/2024 Cancer Staging   Staging form: Mycosis Fungoides and Sezary Syndrome, AJCC 8th Edition - Clinical: Stage IVB (cT4, cN0, pM1, B0) - Signed by Rennie Cindy SAUNDERS, MD on 04/03/2024   04/13/2024 -  Chemotherapy   Patient is on Treatment Plan : NON-HODGKINS LYMPHOMA T-CELL Pralatrexate q49d        HISTORY OF PRESENTING ILLNESS: Patient ambulating-independently. Alone   Manuel Cook 78 y.o.  male pleasant patient with a longstanding history of T-cell cutaneous lymphoma-is here for initial evaluation/and proceed with ongoing therapy.   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  However more recently because of logistics and the distance-he is interested in following up closer home.  Patient notes to have progression of disease recently.  With increasing nodularity of the skin lesions and also increasing itching.   Also complains of poor IV access.  Poor appetite.  Positive for weight loss.  Review of Systems  Constitutional:  Positive for malaise/fatigue and weight loss. Negative for chills, diaphoresis and fever.  HENT:  Negative for nosebleeds and sore throat.   Eyes:  Negative for double vision.  Respiratory:  Negative for cough, hemoptysis, sputum production, shortness of breath and wheezing.   Cardiovascular:  Negative for chest pain, palpitations, orthopnea and leg swelling.  Gastrointestinal:  Negative for abdominal pain, blood in stool, constipation, diarrhea, heartburn, melena, nausea and vomiting.  Genitourinary:  Negative for dysuria, frequency and urgency.  Musculoskeletal:  Negative for back pain and joint pain.  Skin:  Positive for itching and rash.  Neurological:  Negative for dizziness, tingling, focal weakness, weakness and headaches.  Endo/Heme/Allergies:  Does not bruise/bleed easily.  Psychiatric/Behavioral:  Negative for depression. The patient is not nervous/anxious and does not have insomnia.     MEDICAL HISTORY:  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HCC)    Pneumonia    Sleep apnea     SURGICAL HISTORY: Past Surgical History:  Procedure Laterality Date   ANTERIOR LAT LUMBAR FUSION N/A 04/08/2021   Procedure: Lumbar one-two Lumbar two-three Anterolateral  lumbar interbody fusion with posterior percutaneous fixation Lumbar one to Lumbar three, removal of old hardware at Lumbar four-five;  Surgeon: Colon Shove, MD;  Location: [facility];  Service:  Neurosurgery;  Laterality: N/A;   BACK SURGERY     BIOPSY  07/20/2023   Procedure: BIOPSY;  Surgeon: Maryruth Ole DASEN, MD;  Location: [facility] ENDOSCOPY;  Service: Endoscopy;;   CARDIAC CATHETERIZATION     COLONOSCOPY WITH PROPOFOL  N/A 12/22/2016   Procedure: COLONOSCOPY WITH PROPOFOL ;  Surgeon: Gladis RAYMOND Mariner, MD;  Location: [facility] ENDOSCOPY;  Service: Endoscopy;  Laterality: N/A;   CORONARY ANGIOPLASTY WITH STENT PLACEMENT     ESOPHAGOGASTRODUODENOSCOPY N/A 09/12/2021   Procedure: ESOPHAGOGASTRODUODENOSCOPY (EGD);  Surgeon: Onita Elspeth Sharper, DO;  Location: [facility] ENDOSCOPY;  Service: Gastroenterology;  Laterality: N/A;   ESOPHAGOGASTRODUODENOSCOPY (EGD) WITH PROPOFOL  N/A 07/20/2023   Procedure: ESOPHAGOGASTRODUODENOSCOPY (EGD) WITH PROPOFOL ;  Surgeon: Maryruth Ole DASEN, MD;  Location: [facility] ENDOSCOPY;  Service: Endoscopy;  Laterality: N/A;   EYE SURGERY     LUMBAR FUSION  10/28/2009   L3-5 PLIF   LUMBAR PERCUTANEOUS PEDICLE SCREW 2 LEVEL N/A 04/08/2021   Procedure: LUMBAR PERCUTANEOUS PEDICLE SCREW LUMBAR ONE TO THREE;  Surgeon: Colon Shove, MD;  Location: [facility] OR;  Service: Neurosurgery;  Laterality: N/A;   Neck fusion  1995   TONSILLECTOMY      SOCIAL HISTORY: Social History   Socioeconomic History   Marital status: Married    Spouse name: Not on file   Number of children: Not on file   Years of education: Not on file   Highest education level: Not on file  Occupational History   Not on file  Tobacco Use   Smoking status: Former   Smokeless tobacco: Never   Tobacco comments:    qiut age 21  Vaping Use   Vaping status: Never Used  Substance and Sexual Activity   Alcohol  use: Yes    Alcohol /week: 0.0 standard drinks of alcohol     Comment: social   Drug use: No   Sexual activity: Not on file  Other Topics Concern   Not on file  Social History Narrative   Not on file   Social Drivers of Health   Financial Resource Strain: Low Risk  (01/18/2024)   Received from  [facility]   Overall Financial Resource Strain (CARDIA)    Difficulty of Paying Living Expenses: Not hard at all  Food Insecurity: No Food Insecurity (03/29/2024)   Hunger Vital Sign    Worried About Running Out of Food in the Last Year: Never true    Ran Out of Food in the Last Year: Never true  Transportation Needs: No Transportation Needs (03/29/2024)   PRAPARE - Administrator, Civil Service (Medical): No    Lack of Transportation (Non-Medical): No  Physical Activity: Not on file  Stress: No Stress Concern Present (06/18/2022)   [employer] of Occupational Health - Occupational Stress Questionnaire    Feeling of Stress : Not at all  Social Connections: Socially Integrated (06/18/2022)   Social Connection and Isolation Panel    Frequency of Communication with Friends and Family: More than three times a week    Frequency of Social Gatherings with Friends and Family: More than three times a week    Attends Religious Services: More than 4 times per year    Active Member of [employer] or Organizations: Yes    Attends Banker Meetings: More than 4 times per year    Marital  Status: Married  Catering manager Violence: Not At Risk (03/29/2024)   Humiliation, Afraid, Rape, and Kick questionnaire    Fear of Current or Ex-Partner: No    Emotionally Abused: No    Physically Abused: No    Sexually Abused: No    FAMILY HISTORY: Family History  Problem Relation Age of Onset   Heart attack Father        Brother   Diabetes Mellitus II Brother    Prostate cancer Neg Hx     ALLERGIES:  is allergic to sulfa antibiotics.  MEDICATIONS:  Current Outpatient Medications  Medication Sig Dispense Refill   acetaminophen  (TYLENOL ) 500 MG tablet Take 1,000 mg by mouth every 6 (six) hours as needed for moderate pain or headache.     acitretin (SORIATANE) 25 MG capsule Take 25 mg by mouth daily before breakfast.     aspirin 81 MG EC tablet Take 81 mg by mouth  daily.       atenolol  (TENORMIN ) 25 MG tablet Take by mouth daily.     atorvastatin  (LIPITOR) 20 MG tablet Take 20 mg by mouth daily.     augmented betamethasone  dipropionate (DIPROLENE -AF) 0.05 % ointment Apply topically 2 (two) times daily.     bexarotene (TARGRETIN) 75 MG CAPS capsule Take 3 capsules by mouth daily.     clobetasol cream (TEMOVATE) 0.05 % Apply 1 application topically 2 (two) times daily.     doxycycline (VIBRAMYCIN) 100 MG capsule Take 100 mg by mouth 2 (two) times daily.     leucovorin  (WELLCOVORIN ) 25 MG tablet Take 1 tablet (25 mg total) by mouth 3 (three) times daily. Take Three times a day- Take for 2 consecutive days [a total of 6 doses] beginning 24 hours after each dose of Pralatrexate chemotherapy. 60 tablet 3   methocarbamol  (ROBAXIN ) 500 MG tablet Take 1 tablet (500 mg total) by mouth every 6 (six) hours as needed for muscle spasms. 30 tablet 3   pantoprazole  (PROTONIX ) 40 MG tablet Take 40 mg by mouth daily as needed (acid reflux).     potassium chloride  SA (KLOR-CON  M) 20 MEQ tablet Take 1 tablet (20 mEq total) by mouth daily for 7 days. 5 tablet 0   triamcinolone  ointment (KENALOG ) 0.1 % Apply 1 application topically 2 (two) times daily.     folic acid  (FOLVITE ) 1 MG tablet Take 1 tablet (1 mg total) by mouth daily. 90 tablet 6   furosemide  (LASIX ) 20 MG tablet Take 2 tablets (40 mg total) by mouth daily for 7 days. 7 tablet 0   ondansetron  (ZOFRAN ) 8 MG tablet Take 1 tablet (8 mg total) by mouth every 8 (eight) hours as needed for nausea or vomiting. 60 tablet 1   prochlorperazine  (COMPAZINE ) 10 MG tablet Take 1 tablet (10 mg total) by mouth every 6 (six) hours as needed for nausea or vomiting. 60 tablet 1   No current facility-administered medications for this visit.    PHYSICAL EXAMINATION:   Vitals:   03/29/24 1406  BP: 121/60  Pulse: 66  Resp: 20  Temp: 98.5 F (36.9 C)  SpO2: 97%   Filed Weights   03/29/24 1406  Weight: 183 lb 4.8 oz (83.1 kg)     Physical Exam Vitals and nursing note reviewed.  HENT:     Head: Normocephalic and atraumatic.     Mouth/Throat:     Pharynx: Oropharynx is clear.  Eyes:     Extraocular Movements: Extraocular movements intact.     Pupils: Pupils  are equal, round, and reactive to light.  Cardiovascular:     Rate and Rhythm: Normal rate and regular rhythm.  Pulmonary:     Comments: Decreased breath sounds bilaterally.  Abdominal:     Palpations: Abdomen is soft.  Musculoskeletal:        General: Normal range of motion.     Cervical back: Normal range of motion.  Skin:    General: Skin is warm.  Neurological:     General: No focal deficit present.     Mental Status: He is alert and oriented to person, place, and time.  Psychiatric:        Behavior: Behavior normal.        Judgment: Judgment normal.                 LABORATORY DATA:  I have reviewed the data as listed Lab Results  Component Value Date   WBC 9.3 10/28/2023   HGB 13.9 10/28/2023   HCT 39.9 10/28/2023   MCV 97.1 10/28/2023   PLT 284 10/28/2023   Recent Labs    05/07/23 1254 10/28/23 1912  NA 137 139  K 3.7 3.0*  CL 104 101  CO2 25 24  GLUCOSE 120* 96  BUN 17 14  CREATININE 0.93 1.02  CALCIUM  9.1 9.2  GFRNONAA >60 >60  PROT  --  6.5  ALBUMIN  --  3.5  AST  --  24  ALT  --  22  ALKPHOS  --  59  BILITOT  --  1.8*    RADIOGRAPHIC STUDIES: I have personally reviewed the radiological images as listed and agreed with the findings in the report. NM PET Image Restage (PS) Whole Body (F-18 FDG) Result Date: 04/03/2024 CLINICAL DATA:  Subsequent treatment strategy for [facility]-PETCTmycosis fungoides T-cell lymphoma. EXAM: NUCLEAR MEDICINE PET WHOLE BODY TECHNIQUE: 9.8 mCi F-18 FDG was injected intravenously. Full-ring PET imaging was performed from the head to foot after the radiotracer. CT data was obtained and used for attenuation correction and anatomic localization. Fasting blood glucose: 102 mg/dl  COMPARISON:  None Available. FINDINGS: HEAD/NECK: Posterior LEFT scalp lesion with SUV max equal 9.2 on image 14. Incidental CT findings: CHEST: Bilateral round pulmonary nodules of varying sizes with metabolic activity. Example nodule in the LEFT upper lobe measures 20 mm with SUV max equal 17.5 on image 77. Smaller example node in the LEFT lower lobe measuring 11 mm SUV max 9.1 approximately 10 nodules within the lungs. Several cutaneous lesions in the thorax. Example lesion over the LEFT central back SUV max equal 8.0 on image 89. Lesion in the proximal RIGHT upper arm with SUV max equal 4. On image 81. There is skin thickening associated with this lesion measuring 20 mm on image 80. Incidental CT findings: none ABDOMEN/PELVIS: No metabolic lesions in the solid organ in the abdomen pelvis. No abdominopelvic lymphadenopathy. There multiple cutaneous lesions over the LEFT abdominal wall associated with subtle skin thickening. Example lesion just LEFT of midline the lower abdomen with SUV max equal 7.1 on image 63. Incidental CT findings: none SKELETON: No lesions within the skeleton identified. Incidental CT findings: none EXTREMITIES: Hypermetabolic cutaneous lesions within the medial LEFT thigh. Thickened superficial lesion superior to the medial RIGHT femoral condyle measures 23 mmon image 242 with SUV max equal 8.9 Incidental CT findings: none IMPRESSION: 1. 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 2. Multiple hypermetabolic cutaneous lesions involving the scalp, trunk and extremities consistent with active mycosis fungoides (T-cell lymphoma). 3.  No evidence of active lymphoma in the skeleton. Electronically Signed   By: Jackquline Boxer M.D.   On: 04/03/2024 13:24     Mycosis fungoides, unspecified site (HCC) # since 2012- Cutaneous T-cell lymphoma-# FEB 2025- PET [facility]-  Extensive cutaneous/subcutaneous hypermetabolic lesions in the bilateral upper and lower  extremities, consistent with history of cutaneous T-cell lymphoma. No convincing evidence of nodal or visceral involvement. # MAY 2025- CT chest- IMPRESSION: 1. Numerous new bilateral pulmonary nodules, measuring up to 10 mm. Imaging features are highly concerning for metastatic disease.  # Patient currently status post multiple lines of therapy -most recently noted to have  progression with new tumors despite bexarotene.    I recommend switching over to pralatrexate. . The recommended dosage was 15mg /m2 for 3/4 weeks. The ORR was 45%.  I discussed the importance risk of mucositis-recommend folic acid  supplementation also B12 supplementation.  Given high risk of mucositis I recommend leucovorin  with each pralatrexate - 25 mg TID was self-administered for 2 consecutive days (a total of 6 doses) beginning 24 hours after each dose of Pralatrexate.  Prescription sent to pharmacy.  # Iatrogenic central hypothyroidism:  [sec to bexarotene.]-On thyroid  supplementation.  Monitor closely off bexarotene since their thyroid  issues resolve   # Pulmonary nodule - LLL nodule on PET scan 09/2023 - 0.7cm w low level increased uptake. s/p evaluation with pulmonary Dr. DELENA 03/27/24.  Await above PET scan.  Will discuss with Dr. DELENA.  # Prostate - pet scan with increased FDG avidity. PSA normal recently. he is setting up to see urologist locally in early July  # Poor IV access: Recommend evaluation with Dr. Dew/vascular for consideration of port placement which might be difficult/tricky given his diffuse skin lesions.  # ACP:   # IV ACCESS: Poor IV- Refer to Dr.Dew re: Mediport placement  Thank you Dr.Beavens for allowing me to participate in the care of your pleasant patient. Please do not hesitate to contact me with questions or concerns in the interim.  *add tsh to next labs- # DISPOSITION: # No blood work today # chemo education:  re: Pralatrexate- # PET scan ASAP-  # Refer to Dr.Dew re: Mediport  placement Addendum:  # ADD b12 injection on the day of chemo education # as per IS- # July 17th- MD; labs- cbc/cmp; chemo # day-8; lab- cbc/cmp; chemo # day 15-MD; labs- cbc/cmp; chemo- Dr.B   # 60 minutes face-to-face with the patient discussing the above plan of care; more than 50% of time spent on prognosis/ natural history; counseling and coordination.  Above plan of care was discussed with patient/family in detail.  My contact information was given to the patient/family.      Cindy JONELLE Joe, MD 04/03/2024 2:35 PM

## 2024-03-29 NOTE — Telephone Encounter (Signed)
 Faxed referral to Dr Marea at [facility] for [facility] placement.

## 2024-03-29 NOTE — Assessment & Plan Note (Addendum)
#   since 2012- Cutaneous T-cell lymphoma-# FEB 2025- PET [facility]-  Extensive cutaneous/subcutaneous hypermetabolic lesions in the bilateral upper and lower extremities, consistent with history of cutaneous T-cell lymphoma. No convincing evidence of nodal or visceral involvement. # MAY 2025- CT chest- IMPRESSION: 1. Numerous new bilateral pulmonary nodules, measuring up to 10 mm. Imaging features are highly concerning for metastatic disease.  # Patient currently status post multiple lines of therapy -most recently noted to have  progression with new tumors despite bexarotene.    I recommend switching over to pralatrexate. . The recommended dosage was 15mg /m2 for 3/4 weeks. The ORR was 45%.  I discussed the importance risk of mucositis-recommend folic acid  supplementation also B12 supplementation.  Given high risk of mucositis I recommend leucovorin  with each pralatrexate - 25 mg TID was self-administered for 2 consecutive days (a total of 6 doses) beginning 24 hours after each dose of Pralatrexate.  Prescription sent to pharmacy.  # Iatrogenic central hypothyroidism:  [sec to bexarotene.]-On thyroid  supplementation.  Monitor closely off bexarotene since their thyroid  issues resolve   # Pulmonary nodule - LLL nodule on PET scan 09/2023 - 0.7cm w low level increased uptake. s/p evaluation with pulmonary Dr. DELENA 03/27/24.  Await above PET scan.  Will discuss with Dr. DELENA.  # Prostate - pet scan with increased FDG avidity. PSA normal recently. he is setting up to see urologist locally in early July  # Poor IV access: Recommend evaluation with Dr. Dew/vascular for consideration of port placement which might be difficult/tricky given his diffuse skin lesions.  # ACP:   # IV ACCESS: Poor IV- Refer to Dr.Dew re: Mediport placement  Thank you Dr.Beavens for allowing me to participate in the care of your pleasant patient. Please do not hesitate to contact me with questions or concerns in the interim.  *add tsh to next  labs- # DISPOSITION: # No blood work today # chemo education:  re: Pralatrexate- # PET scan ASAP-  # Refer to Dr.Dew re: Mediport placement Addendum:  # ADD b12 injection on the day of chemo education # as per IS- # July 17th- MD; labs- cbc/cmp; chemo # day-8; lab- cbc/cmp; chemo # day 15-MD; labs- cbc/cmp; chemo- Dr.B   # 60 minutes face-to-face with the patient discussing the above plan of care; more than 50% of time spent on prognosis/ natural history; counseling and coordination.

## 2024-03-29 NOTE — Progress Notes (Signed)
 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 08/2022 09/2023 - progressive tumors - biopsy of skin cd30-, CD5+, CD4-/CD8- - no large cell transformation  10/2023-01/28/2024 - gemcitabine  x 4 cycles --> The tumors resolved with gemcitabine  but patch/plaque disease on his skin elsewhere progressed 03/03/24 - bexarotene 225mg /day   Mycosis fungoides, unspecified site (HCC)  05/24/2012 Initial Diagnosis   Mycosis fungoides, unspecified site (HCC)   04/03/2024 Cancer Staging   Staging form: Mycosis Fungoides and Sezary Syndrome, AJCC 8th Edition - Clinical: Stage IVB (cT4, cN0, pM1, B0) - Signed by Rennie Cindy SAUNDERS, MD on 04/03/2024   04/13/2024 -  Chemotherapy   Patient is on Treatment Plan : NON-HODGKINS LYMPHOMA T-CELL Pralatrexate q49d        HISTORY OF PRESENTING ILLNESS: Patient ambulating-independently. Alone   Manuel Cook 78 y.o.  male pleasant patient with a longstanding history of T-cell cutaneous lymphoma-is here for initial evaluation/and proceed with ongoing therapy.   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  However more recently because of logistics and the distance-he is interested in following up closer home.  Patient notes to have progression of disease recently.  With increasing nodularity of the skin lesions and also increasing itching.   Also complains of poor IV access.  Poor appetite.  Positive for weight loss.  Review of Systems  Constitutional:  Positive for malaise/fatigue and weight loss. Negative for chills, diaphoresis and fever.  HENT:  Negative for nosebleeds and sore throat.   Eyes:  Negative for double vision.  Respiratory:  Negative for cough, hemoptysis, sputum production, shortness of breath and wheezing.   Cardiovascular:  Negative for chest pain, palpitations, orthopnea and leg swelling.  Gastrointestinal:  Negative for abdominal pain, blood in stool, constipation, diarrhea, heartburn, melena, nausea and vomiting.  Genitourinary:  Negative for dysuria, frequency and urgency.  Musculoskeletal:  Negative for back pain and joint pain.  Skin:  Positive for itching and rash.  Neurological:  Negative for dizziness, tingling, focal weakness, weakness and headaches.  Endo/Heme/Allergies:  Does not bruise/bleed easily.  Psychiatric/Behavioral:  Negative for depression. The patient is not nervous/anxious and does not have insomnia.     MEDICAL HISTORY:  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HCC)    Pneumonia    Sleep apnea     SURGICAL HISTORY: Past Surgical History:  Procedure Laterality Date   ANTERIOR LAT LUMBAR FUSION N/A 04/08/2021   Procedure: Lumbar one-two Lumbar two-three Anterolateral  lumbar interbody fusion with posterior percutaneous fixation Lumbar one to Lumbar three, removal of old hardware at Lumbar four-five;  Surgeon: Colon Shove, MD;  Location[facility];  Service:  Neurosurgery;  Laterality: N/A;   BACK SURGERY     BIOPSY  07/20/2023   Procedure: BIOPSY;  Surgeon: Maryruth Ole DASEN, MD;  Location: [facility] ENDOSCOPY;  Service: Endoscopy;;   CARDIAC CATHETERIZATION     COLONOSCOPY WITH PROPOFOL  N/A 12/22/2016   Procedure: COLONOSCOPY WITH PROPOFOL ;  Surgeon: Gladis RAYMOND Mariner, MD;  Location: [facility] ENDOSCOPY;  Service: Endoscopy;  Laterality: N/A;   CORONARY ANGIOPLASTY WITH STENT PLACEMENT     ESOPHAGOGASTRODUODENOSCOPY N/A 09/12/2021   Procedure: ESOPHAGOGASTRODUODENOSCOPY (EGD);  Surgeon: Onita Elspeth Sharper, DO;  Location: [facility] ENDOSCOPY;  Service: Gastroenterology;  Laterality: N/A;   ESOPHAGOGASTRODUODENOSCOPY (EGD) WITH PROPOFOL  N/A 07/20/2023   Procedure: ESOPHAGOGASTRODUODENOSCOPY (EGD) WITH PROPOFOL ;  Surgeon: Maryruth Ole DASEN, MD;  Location: [facility] ENDOSCOPY;  Service: Endoscopy;  Laterality: N/A;   EYE SURGERY     LUMBAR FUSION  10/28/2009   L3-5 PLIF   LUMBAR PERCUTANEOUS PEDICLE SCREW 2 LEVEL N/A 04/08/2021   Procedure: LUMBAR PERCUTANEOUS PEDICLE SCREW LUMBAR ONE TO THREE;  Surgeon: Colon Shove, MD;  Location: [facility] OR;  Service: Neurosurgery;  Laterality: N/A;   Neck fusion  1995   TONSILLECTOMY      SOCIAL HISTORY: Social History   Socioeconomic History   Marital status: Married    Spouse name: Not on file   Number of children: Not on file   Years of education: Not on file   Highest education level: Not on file  Occupational History   Not on file  Tobacco Use   Smoking status: Former   Smokeless tobacco: Never   Tobacco comments:    qiut age 21  Vaping Use   Vaping status: Never Used  Substance and Sexual Activity   Alcohol  use: Yes    Alcohol /week: 0.0 standard drinks of alcohol     Comment: social   Drug use: No   Sexual activity: Not on file  Other Topics Concern   Not on file  Social History Narrative   Not on file   Social Drivers of Health   Financial Resource Strain: Low Risk  (01/18/2024)   Received from  [facility]   Overall Financial Resource Strain (CARDIA)    Difficulty of Paying Living Expenses: Not hard at all  Food Insecurity: No Food Insecurity (03/29/2024)   Hunger Vital Sign    Worried About Running Out of Food in the Last Year: Never true    Ran Out of Food in the Last Year: Never true  Transportation Needs: No Transportation Needs (03/29/2024)   PRAPARE - Administrator, Civil Service (Medical): No    Lack of Transportation (Non-Medical): No  Physical Activity: Not on file  Stress: No Stress Concern Present (06/18/2022)   [employer] of Occupational Health - Occupational Stress Questionnaire    Feeling of Stress : Not at all  Social Connections: Socially Integrated (06/18/2022)   Social Connection and Isolation Panel    Frequency of Communication with Friends and Family: More than three times a week    Frequency of Social Gatherings with Friends and Family: More than three times a week    Attends Religious Services: More than 4 times per year    Active Member of [employer] or Organizations: Yes    Attends Banker Meetings: More than 4 times per year    Marital  Status: Married  Catering manager Violence: Not At Risk (03/29/2024)   Humiliation, Afraid, Rape, and Kick questionnaire    Fear of Current or Ex-Partner: No    Emotionally Abused: No    Physically Abused: No    Sexually Abused: No    FAMILY HISTORY: Family History  Problem Relation Age of Onset   Heart attack Father        Brother   Diabetes Mellitus II Brother    Prostate cancer Neg Hx     ALLERGIES:  is allergic to sulfa antibiotics.  MEDICATIONS:  Current Outpatient Medications  Medication Sig Dispense Refill   acetaminophen  (TYLENOL ) 500 MG tablet Take 1,000 mg by mouth every 6 (six) hours as needed for moderate pain or headache.     acitretin (SORIATANE) 25 MG capsule Take 25 mg by mouth daily before breakfast.     aspirin 81 MG EC tablet Take 81 mg by mouth  daily.       atenolol  (TENORMIN ) 25 MG tablet Take by mouth daily.     atorvastatin  (LIPITOR) 20 MG tablet Take 20 mg by mouth daily.     augmented betamethasone  dipropionate (DIPROLENE -AF) 0.05 % ointment Apply topically 2 (two) times daily.     bexarotene (TARGRETIN) 75 MG CAPS capsule Take 3 capsules by mouth daily.     clobetasol cream (TEMOVATE) 0.05 % Apply 1 application topically 2 (two) times daily.     doxycycline (VIBRAMYCIN) 100 MG capsule Take 100 mg by mouth 2 (two) times daily.     leucovorin  (WELLCOVORIN ) 25 MG tablet Take 1 tablet (25 mg total) by mouth 3 (three) times daily. Take Three times a day- Take for 2 consecutive days [a total of 6 doses] beginning 24 hours after each dose of Pralatrexate chemotherapy. 60 tablet 3   methocarbamol  (ROBAXIN ) 500 MG tablet Take 1 tablet (500 mg total) by mouth every 6 (six) hours as needed for muscle spasms. 30 tablet 3   pantoprazole  (PROTONIX ) 40 MG tablet Take 40 mg by mouth daily as needed (acid reflux).     potassium chloride  SA (KLOR-CON  M) 20 MEQ tablet Take 1 tablet (20 mEq total) by mouth daily for 7 days. 5 tablet 0   triamcinolone  ointment (KENALOG ) 0.1 % Apply 1 application topically 2 (two) times daily.     folic acid  (FOLVITE ) 1 MG tablet Take 1 tablet (1 mg total) by mouth daily. 90 tablet 6   furosemide  (LASIX ) 20 MG tablet Take 2 tablets (40 mg total) by mouth daily for 7 days. 7 tablet 0   ondansetron  (ZOFRAN ) 8 MG tablet Take 1 tablet (8 mg total) by mouth every 8 (eight) hours as needed for nausea or vomiting. 60 tablet 1   prochlorperazine  (COMPAZINE ) 10 MG tablet Take 1 tablet (10 mg total) by mouth every 6 (six) hours as needed for nausea or vomiting. 60 tablet 1   No current facility-administered medications for this visit.    PHYSICAL EXAMINATION:   Vitals:   03/29/24 1406  BP: 121/60  Pulse: 66  Resp: 20  Temp: 98.5 F (36.9 C)  SpO2: 97%   Filed Weights   03/29/24 1406  Weight: 183 lb 4.8 oz (83.1 kg)     Physical Exam Vitals and nursing note reviewed.  HENT:     Head: Normocephalic and atraumatic.     Mouth/Throat:     Pharynx: Oropharynx is clear.  Eyes:     Extraocular Movements: Extraocular movements intact.     Pupils: Pupils  are equal, round, and reactive to light.  Cardiovascular:     Rate and Rhythm: Normal rate and regular rhythm.  Pulmonary:     Comments: Decreased breath sounds bilaterally.  Abdominal:     Palpations: Abdomen is soft.  Musculoskeletal:        General: Normal range of motion.     Cervical back: Normal range of motion.  Skin:    General: Skin is warm.  Neurological:     General: No focal deficit present.     Mental Status: He is alert and oriented to person, place, and time.  Psychiatric:        Behavior: Behavior normal.        Judgment: Judgment normal.                 LABORATORY DATA:  I have reviewed the data as listed Lab Results  Component Value Date   WBC 9.3 10/28/2023   HGB 13.9 10/28/2023   HCT 39.9 10/28/2023   MCV 97.1 10/28/2023   PLT 284 10/28/2023   Recent Labs    05/07/23 1254 10/28/23 1912  NA 137 139  K 3.7 3.0*  CL 104 101  CO2 25 24  GLUCOSE 120* 96  BUN 17 14  CREATININE 0.93 1.02  CALCIUM  9.1 9.2  GFRNONAA >60 >60  PROT  --  6.5  ALBUMIN  --  3.5  AST  --  24  ALT  --  22  ALKPHOS  --  59  BILITOT  --  1.8*    RADIOGRAPHIC STUDIES: I have personally reviewed the radiological images as listed and agreed with the findings in the report. NM PET Image Restage (PS) Whole Body (F-18 FDG) Result Date: 04/03/2024 CLINICAL DATA:  Subsequent treatment strategy for [facility]-PETCTmycosis fungoides T-cell lymphoma. EXAM: NUCLEAR MEDICINE PET WHOLE BODY TECHNIQUE: 9.8 mCi F-18 FDG was injected intravenously. Full-ring PET imaging was performed from the head to foot after the radiotracer. CT data was obtained and used for attenuation correction and anatomic localization. Fasting blood glucose: 102 mg/dl  COMPARISON:  None Available. FINDINGS: HEAD/NECK: Posterior LEFT scalp lesion with SUV max equal 9.2 on image 14. Incidental CT findings: CHEST: Bilateral round pulmonary nodules of varying sizes with metabolic activity. Example nodule in the LEFT upper lobe measures 20 mm with SUV max equal 17.5 on image 77. Smaller example node in the LEFT lower lobe measuring 11 mm SUV max 9.1 approximately 10 nodules within the lungs. Several cutaneous lesions in the thorax. Example lesion over the LEFT central back SUV max equal 8.0 on image 89. Lesion in the proximal RIGHT upper arm with SUV max equal 4. On image 81. There is skin thickening associated with this lesion measuring 20 mm on image 80. Incidental CT findings: none ABDOMEN/PELVIS: No metabolic lesions in the solid organ in the abdomen pelvis. No abdominopelvic lymphadenopathy. There multiple cutaneous lesions over the LEFT abdominal wall associated with subtle skin thickening. Example lesion just LEFT of midline the lower abdomen with SUV max equal 7.1 on image 63. Incidental CT findings: none SKELETON: No lesions within the skeleton identified. Incidental CT findings: none EXTREMITIES: Hypermetabolic cutaneous lesions within the medial LEFT thigh. Thickened superficial lesion superior to the medial RIGHT femoral condyle measures 23 mmon image 242 with SUV max equal 8.9 Incidental CT findings: none IMPRESSION: 1. 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 2. Multiple hypermetabolic cutaneous lesions involving the scalp, trunk and extremities consistent with active mycosis fungoides (T-cell lymphoma). 3.  No evidence of active lymphoma in the skeleton. Electronically Signed   By: Jackquline Boxer M.D.   On: 04/03/2024 13:24     Mycosis fungoides, unspecified site (HCC) # since 2012- Cutaneous T-cell lymphoma-# FEB 2025- PET [facility]-  Extensive cutaneous/subcutaneous hypermetabolic lesions in the bilateral upper and lower  extremities, consistent with history of cutaneous T-cell lymphoma. No convincing evidence of nodal or visceral involvement. # MAY 2025- CT chest- IMPRESSION: 1. Numerous new bilateral pulmonary nodules, measuring up to 10 mm. Imaging features are highly concerning for metastatic disease.  # Patient currently status post multiple lines of therapy -most recently noted to have  progression with new tumors despite bexarotene.    I recommend switching over to pralatrexate. . The recommended dosage was 15mg /m2 for 3/4 weeks. The ORR was 45%.  I discussed the importance risk of mucositis-recommend folic acid  supplementation also B12 supplementation.  Given high risk of mucositis I recommend leucovorin  with each pralatrexate - 25 mg TID was self-administered for 2 consecutive days (a total of 6 doses) beginning 24 hours after each dose of Pralatrexate.  Prescription sent to pharmacy.  # Iatrogenic central hypothyroidism:  [sec to bexarotene.]-On thyroid  supplementation.  Monitor closely off bexarotene since their thyroid  issues resolve   # Pulmonary nodule - LLL nodule on PET scan 09/2023 - 0.7cm w low level increased uptake. s/p evaluation with pulmonary Dr. DELENA 03/27/24.  Await above PET scan.  Will discuss with Dr. DELENA.  # Prostate - pet scan with increased FDG avidity. PSA normal recently. he is setting up to see urologist locally in early July  # Poor IV access: Recommend evaluation with Dr. Dew/vascular for consideration of port placement which might be difficult/tricky given his diffuse skin lesions.  # ACP:   # IV ACCESS: Poor IV- Refer to Dr.Dew re: Mediport placement  Thank you Dr.Beavens for allowing me to participate in the care of your pleasant patient. Please do not hesitate to contact me with questions or concerns in the interim.  *add tsh to next labs- # DISPOSITION: # No blood work today # chemo education:  re: Pralatrexate- # PET scan ASAP-  # Refer to Dr.Dew re: Mediport  placement Addendum:  # ADD b12 injection on the day of chemo education # as per IS- # July 17th- MD; labs- cbc/cmp; chemo # day-8; lab- cbc/cmp; chemo # day 15-MD; labs- cbc/cmp; chemo- Dr.B   # 60 minutes face-to-face with the patient discussing the above plan of care; more than 50% of time spent on prognosis/ natural history; counseling and coordination.  Above plan of care was discussed with patient/family in detail.  My contact information was given to the patient/family.      Cindy JONELLE Joe, MD 04/03/2024 2:35 PM

## 2024-03-30 ENCOUNTER — Telehealth (INDEPENDENT_AMBULATORY_CARE_PROVIDER_SITE_OTHER): Payer: Self-pay

## 2024-03-30 ENCOUNTER — Other Ambulatory Visit: Payer: Self-pay

## 2024-03-30 NOTE — Progress Notes (Signed)
 Pharmacist Chemotherapy Monitoring - Initial Assessment    Anticipated start date: 04/13/24   The following has been reviewed per standard work regarding the patient's treatment regimen: The patient's diagnosis, treatment plan and drug doses, and organ/hematologic function Lab orders and baseline tests specific to treatment regimen  The treatment plan start date, drug sequencing, and pre-medications Prior authorization status  Patient's documented medication list, including drug-drug interaction screen and prescriptions for anti-emetics and supportive care specific to the treatment regimen The drug concentrations, fluid compatibility, administration routes, and timing of the medications to be used The patient's access for treatment and lifetime cumulative dose history, if applicable  The patient's medication allergies and previous infusion related reactions, if applicable   Changes made to treatment plan:  dose adjustment of 15mg /m2 due to indication and co-administration of oral bexarotene and change of treatment regimen to Q28 day cycle. I8,I1,I84 for subsequent cycles after C1  Follow up needed:  Baseline B12 injection prior to therapy initiation   Manuel Cook E Danelia Snodgrass, PharmD, BCPS Clinical Pharmacist   03/30/2024  3:00 PM

## 2024-03-30 NOTE — Telephone Encounter (Signed)
 Spoke with the patient and he is scheduled with Dr. Marea on 04/06/24 with a 11:15 am arrival time to the [facility] for a port placement. Pre-procedure instructions were discussed and will be sent to Mychart and mailed.

## 2024-04-03 ENCOUNTER — Encounter: Payer: Self-pay | Admitting: Internal Medicine

## 2024-04-03 ENCOUNTER — Encounter
Admission: RE | Admit: 2024-04-03 | Discharge: 2024-04-03 | Disposition: A | Discharge status: Home / Self Care | Source: Ambulatory Visit | Attending: Internal Medicine | Admitting: Internal Medicine

## 2024-04-03 ENCOUNTER — Other Ambulatory Visit

## 2024-04-03 DIAGNOSIS — C8400 Mycosis fungoides, unspecified site: Secondary | ICD-10-CM | POA: Diagnosis not present

## 2024-04-03 DIAGNOSIS — R918 Other nonspecific abnormal finding of lung field: Secondary | ICD-10-CM | POA: Diagnosis not present

## 2024-04-03 LAB — GLUCOSE, CAPILLARY: Glucose-Capillary: 102 mg/dL — ABNORMAL HIGH (ref 70–99)

## 2024-04-03 MED ORDER — LEUCOVORIN CALCIUM 25 MG PO TABS: 25.0000 mg | ORAL_TABLET | Freq: Three times a day (TID) | ORAL | 3 refills | Status: DC

## 2024-04-03 MED ORDER — FLUDEOXYGLUCOSE F - 18 (FDG) INJECTION
9.5000 | Freq: Once | INTRAVENOUS | Status: AC | PRN
  Administered 2024-04-03: 9.78 via INTRAVENOUS

## 2024-04-04 ENCOUNTER — Inpatient Hospital Stay

## 2024-04-04 ENCOUNTER — Encounter: Payer: Self-pay | Admitting: Internal Medicine

## 2024-04-05 ENCOUNTER — Encounter: Admitting: Physician Assistant

## 2024-04-05 ENCOUNTER — Other Ambulatory Visit: Payer: Self-pay | Admitting: Internal Medicine

## 2024-04-05 DIAGNOSIS — C8408 Mycosis fungoides, lymph nodes of multiple sites: Secondary | ICD-10-CM | POA: Diagnosis not present

## 2024-04-05 DIAGNOSIS — L97823 Non-pressure chronic ulcer of other part of left lower leg with necrosis of muscle: Secondary | ICD-10-CM | POA: Diagnosis not present

## 2024-04-05 MED ORDER — SODIUM CHLORIDE 0.9 % IV SOLN
80.0000 mg | Freq: Once | INTRAVENOUS | Status: DC
  Filled 2024-04-05: qty 2

## 2024-04-06 ENCOUNTER — Ambulatory Visit
Admission: RE | Admit: 2024-04-06 | Discharge: 2024-04-06 | Disposition: A | Discharge status: Home / Self Care | Attending: Vascular Surgery | Admitting: Vascular Surgery

## 2024-04-06 ENCOUNTER — Other Ambulatory Visit: Payer: Self-pay

## 2024-04-06 ENCOUNTER — Encounter: Payer: Self-pay | Admitting: Vascular Surgery

## 2024-04-06 ENCOUNTER — Encounter
Admission: RE | Disposition: A | Discharge status: Home / Self Care | Payer: Self-pay | Source: Home / Self Care | Attending: Vascular Surgery

## 2024-04-06 DIAGNOSIS — R918 Other nonspecific abnormal finding of lung field: Secondary | ICD-10-CM | POA: Diagnosis not present

## 2024-04-06 DIAGNOSIS — C8400 Mycosis fungoides, unspecified site: Secondary | ICD-10-CM | POA: Diagnosis not present

## 2024-04-06 DIAGNOSIS — E038 Other specified hypothyroidism: Secondary | ICD-10-CM | POA: Insufficient documentation

## 2024-04-06 HISTORY — PX: PORTA CATH INSERTION: CATH118285

## 2024-04-06 SURGERY — PORTA CATH INSERTION
Anesthesia: Moderate Sedation

## 2024-04-06 MED ORDER — FAMOTIDINE 20 MG PO TABS: 40.0000 mg | ORAL_TABLET | Freq: Once | ORAL | Status: DC | PRN

## 2024-04-06 MED ORDER — DIPHENHYDRAMINE HCL 50 MG/ML IJ SOLN: 50.0000 mg | Freq: Once | INTRAMUSCULAR | Status: DC | PRN

## 2024-04-06 MED ORDER — HEPARIN (PORCINE) IN NACL 1000-0.9 UT/500ML-% IV SOLN
INTRAVENOUS | Status: DC | PRN
Start: 2024-04-06 — End: 2024-04-06
  Administered 2024-04-06: 500 mL

## 2024-04-06 MED ORDER — CEFAZOLIN SODIUM-DEXTROSE 2-4 GM/100ML-% IV SOLN
INTRAVENOUS | Status: AC
  Filled 2024-04-06: qty 100

## 2024-04-06 MED ORDER — MIDAZOLAM HCL 2 MG/ML PO SYRP: 8.0000 mg | ORAL_SOLUTION | Freq: Once | ORAL | Status: DC | PRN

## 2024-04-06 MED ORDER — CEFAZOLIN SODIUM-DEXTROSE 2-4 GM/100ML-% IV SOLN
2.0000 g | INTRAVENOUS | Status: AC
  Administered 2024-04-06: 2 g via INTRAVENOUS

## 2024-04-06 MED ORDER — HYDROMORPHONE HCL 1 MG/ML IJ SOLN: 1.0000 mg | Freq: Once | INTRAMUSCULAR | Status: DC | PRN

## 2024-04-06 MED ORDER — MIDAZOLAM HCL 2 MG/2ML IJ SOLN
INTRAMUSCULAR | Status: AC
  Filled 2024-04-06: qty 2

## 2024-04-06 MED ORDER — FENTANYL CITRATE PF 50 MCG/ML IJ SOSY
PREFILLED_SYRINGE | INTRAMUSCULAR | Status: AC
  Filled 2024-04-06: qty 1

## 2024-04-06 MED ORDER — MIDAZOLAM HCL 2 MG/2ML IJ SOLN
INTRAMUSCULAR | Status: DC | PRN
Start: 2024-04-06 — End: 2024-04-06
  Administered 2024-04-06: 2 mg via INTRAVENOUS

## 2024-04-06 MED ORDER — METHYLPREDNISOLONE SODIUM SUCC 125 MG IJ SOLR: 125.0000 mg | Freq: Once | INTRAMUSCULAR | Status: DC | PRN

## 2024-04-06 MED ORDER — ONDANSETRON HCL 4 MG/2ML IJ SOLN: 4.0000 mg | Freq: Four times a day (QID) | INTRAMUSCULAR | Status: DC | PRN

## 2024-04-06 MED ORDER — FENTANYL CITRATE (PF) 100 MCG/2ML IJ SOLN
INTRAMUSCULAR | Status: DC | PRN
  Administered 2024-04-06: 50 ug via INTRAVENOUS

## 2024-04-06 MED ORDER — SODIUM CHLORIDE 0.9 % IV SOLN: INTRAVENOUS | Status: DC

## 2024-04-06 MED ORDER — LIDOCAINE-EPINEPHRINE (PF) 1 %-1:200000 IJ SOLN
INTRAMUSCULAR | Status: DC | PRN
  Administered 2024-04-06: 20 mL

## 2024-04-06 SURGICAL SUPPLY — 9 items
COVER PROBE ULTRASOUND 5X96 (MISCELLANEOUS) IMPLANT
DERMABOND ADVANCED .7 DNX12 (GAUZE/BANDAGES/DRESSINGS) IMPLANT
HANDLE YANKAUER SUCT BULB TIP (MISCELLANEOUS) IMPLANT
KIT PORT POWER 8FR ISP CVUE (Port) IMPLANT
PACK ANGIOGRAPHY (CUSTOM PROCEDURE TRAY) ×1 IMPLANT
PENCIL ELECTRO HAND CTR (MISCELLANEOUS) IMPLANT
SUT MNCRL AB 4-0 PS2 18 (SUTURE) IMPLANT
SUT VIC AB 3-0 SH 27X BRD (SUTURE) IMPLANT
TUBING CONNECTING 10 (TUBING) IMPLANT

## 2024-04-06 NOTE — Interval H&P Note (Signed)
 History and Physical Interval Note:  04/06/2024 11:29 AM  Manuel Cook  has presented today for surgery, with the diagnosis of Porta Cath Placement   Mycosis fungoides.  The various methods of treatment have been discussed with the patient and family. After consideration of risks, benefits and other options for treatment, the patient has consented to  Procedure(s): PORTA CATH INSERTION (N/A) as a surgical intervention.  The patient's history has been reviewed, patient examined, no change in status, stable for surgery.  I have reviewed the patient's chart and labs.  Questions were answered to the patient's satisfaction.     [name]

## 2024-04-06 NOTE — Op Note (Signed)
      [facility]       Operative Note  Date: 04/06/2024  Preoperative diagnosis:  1. Mycosis Fungoides  Postoperative diagnosis:  Same as above  Procedures: #1. Ultrasound guidance for vascular access to the right internal jugular vein. #2. Fluoroscopic guidance for placement of catheter. #3. Placement of CT compatible Port-A-Cath, right internal jugular vein.  Surgeon: Selinda Gu, MD.   Anesthesia: Local with moderate conscious sedation for approximately 25 minutes using 2 mg of Versed  and 50 mcg of Fentanyl   Fluoroscopy time: less than 1 minute  Contrast used: 0  Estimated blood loss: 5 cc  Indication for the procedure:  The patient is a 78 y.o.male with mycosis fungoides.  The patient needs a Port-A-Cath for durable venous access, chemotherapy, lab draws, and CT scans. We are asked to place this. Risks and benefits were discussed and informed consent was obtained.  Description of procedure: The patient was brought to the vascular and interventional radiology suite.  Moderate conscious sedation was administered throughout the procedure during a face to face encounter with the patient with my supervision of the RN administering medicines and monitoring the patient's vital signs, pulse oximetry, telemetry and mental status throughout from the start of the procedure until the patient was taken to the recovery room. The right neck chest and shoulder were sterilely prepped and draped, and a sterile surgical field was created. Ultrasound was used to help visualize a patent right internal jugular vein. This was then accessed under direct ultrasound guidance without difficulty with the Seldinger needle and a permanent image was recorded. A J-wire was placed. After skin nick and dilatation, the peel-away sheath was then placed over the wire. I then anesthetized an area under the clavicle approximately 1-2 fingerbreadths. A transverse incision was created and an inferior  pocket was created with electrocautery and blunt dissection. The port was then brought onto the field, placed into the pocket and secured to the chest wall with 2 Prolene sutures. The catheter was connected to the port and tunneled from the subclavicular incision to the access site. Fluoroscopic guidance was then used to cut the catheter to an appropriate length. The catheter was then placed through the peel-away sheath and the peel-away sheath was removed. The catheter tip was parked in excellent location under fluorocoscopic guidance in the cavoatrial juntion. The pocket was then irrigated with antibiotic impregnated saline and the wound was closed with a running 3-0 Vicryl and a 4-0 Monocryl. The access incision was closed with a single 4-0 Monocryl. The Huber needle was used to withdraw blood and flush the port with heparinized saline. Dermabond was then placed as a dressing. The patient tolerated the procedure well and was taken to the recovery room in stable condition.   Selinda Gu 04/06/2024 12:43 PM   This note was created with Dragon Medical transcription system. Any errors in dictation are purely unintentional.

## 2024-04-07 ENCOUNTER — Encounter: Payer: Self-pay | Admitting: Vascular Surgery

## 2024-04-07 ENCOUNTER — Other Ambulatory Visit

## 2024-04-07 ENCOUNTER — Ambulatory Visit

## 2024-04-10 ENCOUNTER — Inpatient Hospital Stay

## 2024-04-10 ENCOUNTER — Other Ambulatory Visit: Payer: Self-pay | Admitting: *Deleted

## 2024-04-10 DIAGNOSIS — Z5111 Encounter for antineoplastic chemotherapy: Secondary | ICD-10-CM | POA: Diagnosis not present

## 2024-04-10 DIAGNOSIS — C8400 Mycosis fungoides, unspecified site: Secondary | ICD-10-CM

## 2024-04-10 MED ORDER — CYANOCOBALAMIN 1000 MCG/ML IJ SOLN
1000.0000 ug | Freq: Once | INTRAMUSCULAR | Status: AC
  Administered 2024-04-10: 1000 ug via INTRAMUSCULAR
  Filled 2024-04-10: qty 1

## 2024-04-10 MED ORDER — LIDOCAINE-PRILOCAINE 2.5-2.5 % EX CREA: 1.0000 | TOPICAL_CREAM | CUTANEOUS | 3 refills | Status: DC | PRN

## 2024-04-10 NOTE — Progress Notes (Signed)
 [facility] CSW Progress Note  Clinical Social Work introduced self to patient during Patient Education with Raoul Moats, Charity fundraiser.  Provided information regarding CSW role, including counseling, advanced care planning and support group.  Answered questions as needed.   Follow Up Plan:  CSW will follow-up with patient by phone     Macario CHRISTELLA Au, LCSW Clinical Social Worker [facility]

## 2024-04-12 ENCOUNTER — Encounter: Admitting: Physician Assistant

## 2024-04-12 DIAGNOSIS — L97823 Non-pressure chronic ulcer of other part of left lower leg with necrosis of muscle: Secondary | ICD-10-CM | POA: Diagnosis not present

## 2024-04-12 DIAGNOSIS — C9150 Adult T-cell lymphoma/leukemia (HTLV-1-associated) not having achieved remission: Secondary | ICD-10-CM | POA: Diagnosis not present

## 2024-04-12 DIAGNOSIS — C8408 Mycosis fungoides, lymph nodes of multiple sites: Secondary | ICD-10-CM | POA: Diagnosis not present

## 2024-04-13 ENCOUNTER — Inpatient Hospital Stay (HOSPITAL_BASED_OUTPATIENT_CLINIC_OR_DEPARTMENT_OTHER): Admitting: Internal Medicine

## 2024-04-13 ENCOUNTER — Encounter: Payer: Self-pay | Admitting: Internal Medicine

## 2024-04-13 ENCOUNTER — Inpatient Hospital Stay

## 2024-04-13 ENCOUNTER — Other Ambulatory Visit: Payer: Self-pay | Admitting: Pulmonary Disease

## 2024-04-13 VITALS — BP 127/49 | HR 55 | Temp 97.7°F | Resp 20 | Ht 67.0 in | Wt 188.7 lb

## 2024-04-13 VITALS — BP 125/51 | HR 56

## 2024-04-13 DIAGNOSIS — C8408 Mycosis fungoides, lymph nodes of multiple sites: Secondary | ICD-10-CM | POA: Diagnosis not present

## 2024-04-13 DIAGNOSIS — R918 Other nonspecific abnormal finding of lung field: Secondary | ICD-10-CM

## 2024-04-13 DIAGNOSIS — C8400 Mycosis fungoides, unspecified site: Secondary | ICD-10-CM

## 2024-04-13 DIAGNOSIS — Z5111 Encounter for antineoplastic chemotherapy: Secondary | ICD-10-CM | POA: Diagnosis not present

## 2024-04-13 LAB — CBC WITH DIFFERENTIAL (CANCER CENTER ONLY)
Abs Immature Granulocytes: 0.04 K/uL (ref 0.00–0.07)
Basophils Absolute: 0.1 K/uL (ref 0.0–0.1)
Basophils Relative: 1 %
Eosinophils Absolute: 0.3 K/uL (ref 0.0–0.5)
Eosinophils Relative: 5 %
HCT: 37 % — ABNORMAL LOW (ref 39.0–52.0)
Hemoglobin: 11.7 g/dL — ABNORMAL LOW (ref 13.0–17.0)
Immature Granulocytes: 1 %
Lymphocytes Relative: 23 %
Lymphs Abs: 1.3 K/uL (ref 0.7–4.0)
MCH: 30.7 pg (ref 26.0–34.0)
MCHC: 31.6 g/dL (ref 30.0–36.0)
MCV: 97.1 fL (ref 80.0–100.0)
Monocytes Absolute: 0.8 K/uL (ref 0.1–1.0)
Monocytes Relative: 14 %
Neutro Abs: 3.2 K/uL (ref 1.7–7.7)
Neutrophils Relative %: 56 %
Platelet Count: 222 K/uL (ref 150–400)
RBC: 3.81 MIL/uL — ABNORMAL LOW (ref 4.22–5.81)
RDW: 15 % (ref 11.5–15.5)
WBC Count: 5.7 K/uL (ref 4.0–10.5)
nRBC: 0 % (ref 0.0–0.2)

## 2024-04-13 LAB — CMP (CANCER CENTER ONLY)
ALT: 13 U/L (ref 0–44)
AST: 20 U/L (ref 15–41)
Albumin: 3.6 g/dL (ref 3.5–5.0)
Alkaline Phosphatase: 82 U/L (ref 38–126)
Anion gap: 9 (ref 5–15)
BUN: 18 mg/dL (ref 8–23)
CO2: 25 mmol/L (ref 22–32)
Calcium: 9.1 mg/dL (ref 8.9–10.3)
Chloride: 105 mmol/L (ref 98–111)
Creatinine: 0.81 mg/dL (ref 0.61–1.24)
GFR, Estimated: >60 mL/min (ref >60)
Glucose, Bld: 122 mg/dL — ABNORMAL HIGH (ref 70–99)
Potassium: 3.6 mmol/L (ref 3.5–5.1)
Sodium: 139 mmol/L (ref 135–145)
Total Bilirubin: 0.8 mg/dL (ref 0.0–1.2)
Total Protein: 6.9 g/dL (ref 6.5–8.1)

## 2024-04-13 MED ORDER — PRALATREXATE CHEMO INJECTION 20 MG/ML
15.0000 mg/m2 | Freq: Once | INTRAVENOUS | Status: AC
  Administered 2024-04-13: 30 mg via INTRAVENOUS
  Filled 2024-04-13: qty 1.5

## 2024-04-13 MED ORDER — HYDROCOD POLI-CHLORPHE POLI ER 10-8 MG/5ML PO SUER: 5.0000 mL | Freq: Two times a day (BID) | ORAL | 0 refills | Status: DC | PRN

## 2024-04-13 MED ORDER — SODIUM CHLORIDE 0.9 % IV SOLN
INTRAVENOUS | Status: DC
Start: 2024-04-13 — End: 2024-04-13
  Filled 2024-04-13: qty 250

## 2024-04-13 MED ORDER — PROCHLORPERAZINE MALEATE 10 MG PO TABS
10.0000 mg | ORAL_TABLET | Freq: Once | ORAL | Status: AC
  Administered 2024-04-13: 10 mg via ORAL
  Filled 2024-04-13: qty 1

## 2024-04-13 MED ORDER — HEPARIN SOD (PORK) LOCK FLUSH 100 UNIT/ML IV SOLN
500.0000 [IU] | Freq: Once | INTRAVENOUS | Status: AC | PRN
  Administered 2024-04-13: 500 [IU]
  Filled 2024-04-13: qty 5

## 2024-04-13 NOTE — Progress Notes (Signed)
 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 No convincing evidence of nodal or visceral involvement. # MAY 2025- CT chest- IMPRESSION: 1. Numerous new bilateral pulmonary nodules, measuring up to 10 mm. Imaging features are highly concerning for metastatic disease.  # Patient currently status post multiple lines of therapy -most recently noted to have  progression with new tumors despite bexarotene.    # JULY 17th, 2025- pralatrexate - 15mg /m2 for 3/4 weeks.   Mycosis fungoides, lymph nodes of multiple sites (HCC)  05/24/2012 Initial Diagnosis   Mycosis fungoides, unspecified site (HCC)    04/03/2024 Cancer Staging   Staging form: Mycosis Fungoides and Sezary Syndrome, AJCC 8th Edition - Clinical: Stage IVB (cT4, cN0, pM1, B0) - Signed by Rennie Cindy SAUNDERS, MD on 04/03/2024   04/13/2024 -  Chemotherapy   Patient is on Treatment Plan : NON-HODGKINS LYMPHOMA T-CELL Pralatrexate  q49d        HISTORY OF PRESENTING ILLNESS: Patient ambulating-independently. With wife.   Manuel Cook 78 y.o.  male pleasant patient with a longstanding history of T-cell cutaneous lymphoma/mycosis fungoides status post multiple lines of therapy is here to proceed with chemotherapy/and also reviewed the recent PET scan.  Patient s/p IV Mediport placement.  Also status post chemotherapy education.  Patient complains of ongoing itching and also worsening skin nodules.  Also complains of worsening cough over the last few months.  Complains of bilateral leg swelling-on diuretic. Pt states the numbness in his hands is getting worse.   His appetite is fair.  Positive for weight loss.  Review of Systems  Constitutional:  Positive for malaise/fatigue and weight loss. Negative for chills, diaphoresis and fever.  HENT:  Negative for nosebleeds and sore throat.   Eyes:  Negative for double vision.  Respiratory:  Negative for cough, hemoptysis, sputum production, shortness of breath and wheezing.   Cardiovascular:  Negative for chest pain, palpitations, orthopnea and leg swelling.  Gastrointestinal:  Negative for abdominal pain, blood in stool, constipation, diarrhea, heartburn, melena, nausea and vomiting.  Genitourinary:  Negative for dysuria, frequency and urgency.  Musculoskeletal:  Negative for back pain and joint pain.  Skin:  Positive for itching and rash.  Neurological:  Negative for dizziness, tingling, focal weakness, weakness and headaches.  Endo/Heme/Allergies:  Does not bruise/bleed easily.  Psychiatric/Behavioral:  Negative for depression. The patient is not nervous/anxious  and does not  have insomnia.     MEDICAL HISTORY:  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HCC)    Pneumonia    Sleep apnea     SURGICAL HISTORY: Past Surgical History:  Procedure Laterality Date   ANTERIOR LAT LUMBAR FUSION N/A 04/08/2021   Procedure: Lumbar one-two Lumbar two-three Anterolateral lumbar interbody fusion with posterior percutaneous fixation Lumbar one to Lumbar three, removal of old hardware at Lumbar four-five;  Surgeon: Colon Shove, MD;  Location: [facility] OR;  Service: Neurosurgery;  Laterality: N/A;   BACK SURGERY     BIOPSY  07/20/2023   Procedure: BIOPSY;  Surgeon: Maryruth Ole DASEN, MD;  Location: [facility] ENDOSCOPY;  Service: Endoscopy;;   CARDIAC CATHETERIZATION     COLONOSCOPY WITH PROPOFOL  N/A 12/22/2016   Procedure: COLONOSCOPY WITH PROPOFOL ;  Surgeon: Gladis RAYMOND Mariner, MD;  Location: [facility] ENDOSCOPY;  Service: Endoscopy;  Laterality: N/A;   CORONARY ANGIOPLASTY WITH STENT PLACEMENT     ESOPHAGOGASTRODUODENOSCOPY N/A 09/12/2021   Procedure: ESOPHAGOGASTRODUODENOSCOPY (EGD);  Surgeon: Onita Elspeth Sharper, DO;  Location: [facility] ENDOSCOPY;  Service: Gastroenterology;  Laterality: N/A;   ESOPHAGOGASTRODUODENOSCOPY (EGD) WITH PROPOFOL  N/A 07/20/2023   Procedure: ESOPHAGOGASTRODUODENOSCOPY (EGD) WITH PROPOFOL ;  Surgeon: Maryruth Ole DASEN, MD;  Location: [facility] ENDOSCOPY;  Service: Endoscopy;  Laterality: N/A;   EYE SURGERY     LUMBAR FUSION  10/28/2009   L3-5 PLIF   LUMBAR PERCUTANEOUS PEDICLE SCREW 2 LEVEL N/A 04/08/2021   Procedure: LUMBAR PERCUTANEOUS PEDICLE SCREW LUMBAR ONE TO THREE;  Surgeon: Colon Shove, MD;  Location: [facility] OR;  Service: Neurosurgery;  Laterality: N/A;   Neck fusion  1995   PORTA CATH INSERTION N/A 04/06/2024   Procedure: PORTA CATH INSERTION;  Surgeon: Marea Selinda RAMAN, MD;  Location: [facility] INVASIVE CV  LAB;  Service: Cardiovascular;  Laterality: N/A;   TONSILLECTOMY      SOCIAL HISTORY: Social History   Socioeconomic History   Marital status: Married    Spouse name: Not on file   Number of children: Not on file   Years of education: Not on file   Highest education level: Not on file  Occupational History   Not on file  Tobacco Use   Smoking status: Former   Smokeless tobacco: Never   Tobacco comments:    qiut age 72  Vaping Use   Vaping status: Never Used  Substance and Sexual Activity   Alcohol  use: Yes    Alcohol /week: 0.0 standard drinks of alcohol     Comment: social   Drug use: No   Sexual activity: Not on file  Other Topics Concern   Not on file  Social History Narrative   Not on file   Social Drivers of Health   Financial Resource Strain: Low Risk  (01/18/2024)   Received from [facility]   Overall Financial Resource Strain (CARDIA)    Difficulty of Paying Living Expenses: Not hard at all  Food Insecurity: No Food Insecurity (03/29/2024)   Hunger Vital Sign    Worried About Running Out of Food in the Last Year: Never true    Ran Out of Food in the Last Year: Never true  Transportation Needs: No Transportation Needs (03/29/2024)   PRAPARE - Administrator, Civil Service (Medical):  No    Lack of Transportation (Non-Medical): No  Physical Activity: Not on file  Stress: No Stress Concern Present (06/18/2022)   [employer] of Occupational Health - Occupational Stress Questionnaire    Feeling of Stress : Not at all  Social Connections: Socially Integrated (06/18/2022)   Social Connection and Isolation Panel    Frequency of Communication with Friends and Family: More than three times a week    Frequency of Social Gatherings with Friends and Family: More than three times a week    Attends Religious Services: More than 4 times per year    Active Member of [employer] or Organizations: Yes    Attends Engineer, structural: More than  4 times per year    Marital Status: Married  Catering manager Violence: Not At Risk (03/29/2024)   Humiliation, Afraid, Rape, and Kick questionnaire    Fear of Current or Ex-Partner: No    Emotionally Abused: No    Physically Abused: No    Sexually Abused: No    FAMILY HISTORY: Family History  Problem Relation Age of Onset   Heart attack Father        Brother   Diabetes Mellitus II Brother    Prostate cancer Neg Hx     ALLERGIES:  is allergic to sulfa antibiotics.  MEDICATIONS:  Current Outpatient Medications  Medication Sig Dispense Refill   acetaminophen  (TYLENOL ) 500 MG tablet Take 1,000 mg by mouth every 6 (six) hours as needed for moderate pain or headache.     acitretin (SORIATANE) 25 MG capsule Take 25 mg by mouth daily before breakfast.     amLODipine (NORVASC) 5 MG tablet Take 5 mg by mouth daily.     aspirin 81 MG EC tablet Take 81 mg by mouth daily.       atenolol  (TENORMIN ) 25 MG tablet Take by mouth daily.     atorvastatin  (LIPITOR) 20 MG tablet Take 20 mg by mouth daily.     augmented betamethasone  dipropionate (DIPROLENE -AF) 0.05 % ointment Apply topically 2 (two) times daily.     bexarotene (TARGRETIN) 75 MG CAPS capsule Take 3 capsules by mouth daily.     chlorpheniramine-HYDROcodone (TUSSIONEX) 10-8 MG/5ML Take 5 mLs by mouth every 12 (twelve) hours as needed for cough. 140 mL 0   clobetasol cream (TEMOVATE) 0.05 % Apply 1 application topically 2 (two) times daily.     doxycycline (VIBRAMYCIN) 100 MG capsule Take 100 mg by mouth 2 (two) times daily.     folic acid  (FOLVITE ) 1 MG tablet Take 1 tablet (1 mg total) by mouth daily. 90 tablet 6   furosemide  (LASIX ) 20 MG tablet Take 2 tablets (40 mg total) by mouth daily for 7 days. 7 tablet 0   HYDROcodone-acetaminophen  (NORCO/VICODIN) 5-325 MG tablet Take 1 tablet by mouth every 6 (six) hours as needed for moderate pain (pain score 4-6).     leucovorin  (WELLCOVORIN ) 25 MG tablet Take 1 tablet (25 mg total) by mouth  3 (three) times daily. Take Three times a day- Take for 2 consecutive days [a total of 6 doses] beginning 24 hours after each dose of Pralatrexate  chemotherapy. 60 tablet 3   lidocaine -prilocaine  (EMLA ) cream Apply 1 Application topically as needed. Apply to port and cover with saran wrap 1-2 hours prior to port access 30 g 3   methocarbamol  (ROBAXIN ) 500 MG tablet Take 1 tablet (500 mg total) by mouth every 6 (six) hours as needed for muscle spasms. 30 tablet 3  ondansetron  (ZOFRAN ) 8 MG tablet Take 1 tablet (8 mg total) by mouth every 8 (eight) hours as needed for nausea or vomiting. 60 tablet 1   pantoprazole  (PROTONIX ) 40 MG tablet Take 40 mg by mouth daily as needed (acid reflux).     potassium chloride  SA (KLOR-CON  M) 20 MEQ tablet Take 1 tablet (20 mEq total) by mouth daily for 7 days. 5 tablet 0   prochlorperazine  (COMPAZINE ) 10 MG tablet Take 1 tablet (10 mg total) by mouth every 6 (six) hours as needed for nausea or vomiting. 60 tablet 1   triamcinolone  ointment (KENALOG ) 0.1 % Apply 1 application topically 2 (two) times daily.     No current facility-administered medications for this visit.   Facility-Administered Medications Ordered in Other Visits  Medication Dose Route Frequency Provider Last Rate Last Admin   0.9 %  sodium chloride  infusion   Intravenous Continuous Jaheem Hedgepath R, MD 10 mL/hr at 04/13/24 0943 New Bag at 04/13/24 0943   PRALAtrexate  (FOLOTYN ) chemo injection 30 mg  15 mg/m2 (Treatment Plan Recorded) Intravenous Once Kypton Eltringham R, MD        PHYSICAL EXAMINATION:   Vitals:   04/13/24 0804  BP: (!) 127/49  Pulse: (!) 55  Resp: 20  Temp: 97.7 F (36.5 C)  SpO2: 99%   Filed Weights   04/13/24 0804  Weight: 188 lb 11.2 oz (85.6 kg)    Physical Exam Vitals and nursing note reviewed.  HENT:     Head: Normocephalic and atraumatic.     Mouth/Throat:     Pharynx: Oropharynx is clear.  Eyes:     Extraocular Movements: Extraocular movements  intact.     Pupils: Pupils are equal, round, and reactive to light.  Cardiovascular:     Rate and Rhythm: Normal rate and regular rhythm.  Pulmonary:     Comments: Decreased breath sounds bilaterally.  Abdominal:     Palpations: Abdomen is soft.  Musculoskeletal:        General: Normal range of motion.     Cervical back: Normal range of motion.  Skin:    General: Skin is warm.  Neurological:     General: No focal deficit present.     Mental Status: He is alert and oriented to person, place, and time.  Psychiatric:        Behavior: Behavior normal.        Judgment: Judgment normal.                 LABORATORY DATA:  I have reviewed the data as listed Lab Results  Component Value Date   WBC 5.7 04/13/2024   HGB 11.7 (L) 04/13/2024   HCT 37.0 (L) 04/13/2024   MCV 97.1 04/13/2024   PLT 222 04/13/2024   Recent Labs    05/07/23 1254 10/28/23 1912 04/13/24 0815  NA 137 139 139  K 3.7 3.0* 3.6  CL 104 101 105  CO2 25 24 25   GLUCOSE 120* 96 122*  BUN 17 14 18   CREATININE 0.93 1.02 0.81  CALCIUM  9.1 9.2 9.1  GFRNONAA >60 >60 >60  PROT  --  6.5 6.9  ALBUMIN  --  3.5 3.6  AST  --  24 20  ALT  --  22 13  ALKPHOS  --  59 82  BILITOT  --  1.8* 0.8    RADIOGRAPHIC STUDIES: I have personally reviewed the radiological images as listed and agreed with the findings in the report. PERIPHERAL VASCULAR CATHETERIZATION Result  Date: 04/06/2024 See surgical note for result.  NM PET Image Restage (PS) Whole Body (F-18 FDG) Result Date: 04/03/2024 CLINICAL DATA:  Subsequent treatment strategy for [facility]-PETCTmycosis fungoides T-cell lymphoma. EXAM: NUCLEAR MEDICINE PET WHOLE BODY TECHNIQUE: 9.8 mCi F-18 FDG was injected intravenously. Full-ring PET imaging was performed from the head to foot after the radiotracer. CT data was obtained and used for attenuation correction and anatomic localization. Fasting blood glucose: 102 mg/dl COMPARISON:  None Available. FINDINGS:  HEAD/NECK: Posterior LEFT scalp lesion with SUV max equal 9.2 on image 14. Incidental CT findings: CHEST: Bilateral round pulmonary nodules of varying sizes with metabolic activity. Example nodule in the LEFT upper lobe measures 20 mm with SUV max equal 17.5 on image 77. Smaller example node in the LEFT lower lobe measuring 11 mm SUV max 9.1 approximately 10 nodules within the lungs. Several cutaneous lesions in the thorax. Example lesion over the LEFT central back SUV max equal 8.0 on image 89. Lesion in the proximal RIGHT upper arm with SUV max equal 4. On image 81. There is skin thickening associated with this lesion measuring 20 mm on image 80. Incidental CT findings: none ABDOMEN/PELVIS: No metabolic lesions in the solid organ in the abdomen pelvis. No abdominopelvic lymphadenopathy. There multiple cutaneous lesions over the LEFT abdominal wall associated with subtle skin thickening. Example lesion just LEFT of midline the lower abdomen with SUV max equal 7.1 on image 63. Incidental CT findings: none SKELETON: No lesions within the skeleton identified. Incidental CT findings: none EXTREMITIES: Hypermetabolic cutaneous lesions within the medial LEFT thigh. Thickened superficial lesion superior to the medial RIGHT femoral condyle measures 23 mmon image 242 with SUV max equal 8.9 Incidental CT findings: none IMPRESSION: 1. 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 2. Multiple hypermetabolic cutaneous lesions involving the scalp, trunk and extremities consistent with active mycosis fungoides (T-cell lymphoma). 3. No evidence of active lymphoma in the skeleton. Electronically Signed   By: Jackquline Boxer M.D.   On: 04/03/2024 13:24     Mycosis fungoides, lymph nodes of multiple sites (HCC) # since 2012-mycosis fungoides -cutaneous T-cell lymphoma-PET scan- JULY 2025-multiple skin lesions and increasing multiple lung nodules concerning for metastatic disease.  Discussed  with Dr.Aleskerov- HOLD off biopsy.  STAGE IV   # Patient currently status post multiple lines of therapy -most recently noted to have  progression with new tumors despite bexarotene.  Discontinue bexarotene.  Proceed with  pralatrexate  [15mg /m2 for 3/4 weeks].  # Proceed with cycle number 1 day 1 of pralatrexate  [3 weeks on 1 week off].  Patient s/p B12 injection.  Continue folic acid  oral supplement. Given high risk of mucositis I recommend leucovorin  with each pralatrexate  - 25 mg TID was self-administered for 2 consecutive days (a total of 6 doses) beginning 24 hours after each dose of Pralatrexate .  Prescription sent to pharmacy.  Discussed response rates of about 45%.  Treatments are palliative not curative.   # #Mucositis prophylaxis:  salt and baking soda rinses 3-4 times a day-for soreness in the mouth.  Recommend eating bland/nonspicy cold/soft foods.  Also use a gentle toothbrush. If worse- recommend Magic mouthwash/prescription;  # Iatrogenic central hypothyroidism:  [sec to bexarotene.]-On thyroid  supplementation.  Monitor closely off bexarotene since their thyroid  issues resolve   # Prostate - pet scan with increased FDG avidity. PSA normal recently. he is setting up to see urologist locally in early July  #: Recommend evaluation with Dr. Dew/vascular for consideration of port placement which might be  difficult/tricky given his diffuse skin lesions.  # ACP: recommend palliative care re: GOC.   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   Weekly chemo- q 2 week- MD;  # DISPOSITION: # chemo today-  # evaluation with Josh re: palliative care/ GOC # in 1 week-  lab- cbc/cmp; chemo # in 2 weeks- MD; labs- cbc/cmp; chemo-  # in 4 weeks- MD; labs- cbc/cmp; chemo- # in 5  week-  lab- cbc/cmp; chemo  # in 6 weeks- MD; labs- cbc/cmp; chemo-Dr.B   # 40  minutes face-to-face with the patient discussing the above plan of care; more than 50% of time spent on prognosis/ natural  history; counseling and coordination.  # I reviewed the blood work- with the patient in detail; also reviewed the imaging independently [as summarized above]; and with the patient in detail.    Above plan of care was discussed with patient/family in detail.  My contact information was given to the patient/family.      Cindy JONELLE Joe, MD 04/13/2024 10:10 AM

## 2024-04-13 NOTE — Patient Instructions (Signed)

## 2024-04-13 NOTE — Assessment & Plan Note (Addendum)
#   since 2012-mycosis fungoides -cutaneous T-cell lymphoma-PET scan- JULY 2025-multiple skin lesions and increasing multiple lung nodules concerning for metastatic disease.  Discussed with Dr.Aleskerov- HOLD off biopsy.  STAGE IV   # Patient currently status post multiple lines of therapy -most recently noted to have  progression with new tumors despite bexarotene.  Discontinue bexarotene.  Proceed with  pralatrexate  [15mg /m2 for 3/4 weeks].  # Proceed with cycle number 1 day 1 of pralatrexate  [3 weeks on 1 week off].  Patient s/p B12 injection.  Continue folic acid  oral supplement. Given high risk of mucositis I recommend leucovorin  with each pralatrexate  - 25 mg TID was self-administered for 2 consecutive days (a total of 6 doses) beginning 24 hours after each dose of Pralatrexate .  Prescription sent to pharmacy.  Discussed response rates of about 45%.  Treatments are palliative not curative.   # #Mucositis prophylaxis:  salt and baking soda rinses 3-4 times a day-for soreness in the mouth.  Recommend eating bland/nonspicy cold/soft foods.  Also use a gentle toothbrush. If worse- recommend Magic mouthwash/prescription;  # Iatrogenic central hypothyroidism:  [sec to bexarotene.]-On thyroid  supplementation.  Monitor closely off bexarotene since their thyroid  issues resolve   # Prostate - pet scan with increased FDG avidity. PSA normal recently. he is setting up to see urologist locally in early July  # ACP: recommend palliative care re: GOC.   # Cough: sec to pulmonary mets- [Dr.A]- sent a script- for tussionex prn.   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   Weekly chemo- q 2 week- MD;  # DISPOSITION: # chemo today-  # evaluation with Josh re: palliative care/ GOC # in 1 week-  lab- cbc/cmp; chemo # in 2 weeks- MD; labs- cbc/cmp; chemo-  # in 4 weeks- MD; labs- cbc/cmp; chemo- # in 5  week-  lab- cbc/cmp; chemo  # in 6 weeks- MD; labs- cbc/cmp; chemo-Dr.B   # 40  minutes  face-to-face with the patient discussing the above plan of care; more than 50% of time spent on prognosis/ natural history; counseling and coordination.  # I reviewed the blood work- with the patient in detail; also reviewed the imaging independently [as summarized above]; and with the patient in detail.

## 2024-04-13 NOTE — Progress Notes (Signed)
 Pt asking if he can take all 3 of the leucovorin  at one time tomorrow?  Appetite 80% normal, getting better, he is drinking 1 boost per day. He is still having some trouble swallowing.  Pt states the numbness in his hands is getting worse. C/o cough since 4/25. He is asking if the cough is coming from chemo? He read in the chemo lesson book this could be possible.  C/o swelling in feet and legs again, taking lasix  as rx'd.  Is there something he can have for the skin itching? What he has already isn't helping much.

## 2024-04-13 NOTE — Patient Instructions (Addendum)
#   Salt and baking soda rinses 3-4 times a day-for soreness in the mouth.  Recommend eating bland/nonspicy cold/soft foods.  Also use a gentle toothbrush.

## 2024-04-14 ENCOUNTER — Other Ambulatory Visit: Payer: Self-pay

## 2024-04-17 ENCOUNTER — Telehealth: Payer: Self-pay

## 2024-04-17 NOTE — Telephone Encounter (Signed)
Telephone call to patient for follow up after receiving first infusion.   Patient states infusion went great.  States eating good and drinking plenty of fluids.   Denies any nausea or vomiting.  Encouraged patient to call for any concerns or questions. 

## 2024-04-18 ENCOUNTER — Inpatient Hospital Stay (HOSPITAL_BASED_OUTPATIENT_CLINIC_OR_DEPARTMENT_OTHER): Admitting: Hospice and Palliative Medicine

## 2024-04-18 ENCOUNTER — Encounter: Payer: Self-pay | Admitting: Hospice and Palliative Medicine

## 2024-04-18 VITALS — BP 140/48 | HR 60 | Temp 98.6°F | Resp 19 | Wt 186.4 lb

## 2024-04-18 DIAGNOSIS — Z515 Encounter for palliative care: Secondary | ICD-10-CM | POA: Diagnosis not present

## 2024-04-18 DIAGNOSIS — Z5111 Encounter for antineoplastic chemotherapy: Secondary | ICD-10-CM | POA: Diagnosis not present

## 2024-04-18 DIAGNOSIS — C8408 Mycosis fungoides, lymph nodes of multiple sites: Secondary | ICD-10-CM

## 2024-04-18 NOTE — Progress Notes (Signed)
 Palliative Medicine [facility] at [facility] Telephone:([phone]) [phone] Fax:([phone]) [phone]   Name: Manuel Cook Date: 04/18/2024 MRN: [id number]  DOB: Dec 03, 1945  Patient Care Team: Sadie Manna, MD as PCP - General (Internal Medicine) Rennie Cindy JONELLE, MD as Consulting Physician (Oncology)    REASON FOR CONSULTATION: Manuel Cook is a 78 y.o. 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  Patient is referred to palliative care to address goals.   SOCIAL HISTORY:     reports that he has quit smoking. He has never used smokeless tobacco. He reports current alcohol  use. He reports that he does not use drugs.  Patient is married lives at home with his wife.  He has 3 sons who live nearby.  Patient retired as an Personnel officer.  ADVANCE DIRECTIVES:    CODE STATUS:   PAST MEDICAL HISTORY: 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HCC)    Pneumonia    Sleep apnea     PAST SURGICAL HISTORY:  Past Surgical History:  Procedure Laterality Date   ANTERIOR LAT LUMBAR FUSION N/A 04/08/2021   Procedure: Lumbar one-two Lumbar two-three Anterolateral lumbar interbody fusion with posterior percutaneous fixation Lumbar one to Lumbar three, removal of old hardware at Lumbar four-five;  Surgeon: Colon Shove, MD;  Location: [facility] OR;  Service: Neurosurgery;  Laterality: N/A;   BACK SURGERY     BIOPSY  07/20/2023   Procedure: BIOPSY;  Surgeon: Maryruth Ole DASEN, MD;  Location: [facility] ENDOSCOPY;  Service: Endoscopy;;   CARDIAC CATHETERIZATION     COLONOSCOPY WITH PROPOFOL  N/A 12/22/2016   Procedure: COLONOSCOPY WITH PROPOFOL ;  Surgeon: Gladis RAYMOND Mariner, MD;  Location: [facility] ENDOSCOPY;  Service: Endoscopy;   Laterality: N/A;   CORONARY ANGIOPLASTY WITH STENT PLACEMENT     ESOPHAGOGASTRODUODENOSCOPY N/A 09/12/2021   Procedure: ESOPHAGOGASTRODUODENOSCOPY (EGD);  Surgeon: Onita Elspeth Sharper, DO;  Location: [facility] ENDOSCOPY;  Service: Gastroenterology;  Laterality: N/A;   ESOPHAGOGASTRODUODENOSCOPY (EGD) WITH PROPOFOL  N/A 07/20/2023   Procedure: ESOPHAGOGASTRODUODENOSCOPY (EGD) WITH PROPOFOL ;  Surgeon: Maryruth Ole DASEN, MD;  Location: [facility] ENDOSCOPY;  Service: Endoscopy;  Laterality: N/A;   EYE SURGERY     LUMBAR FUSION  10/28/2009   L3-5 PLIF   LUMBAR PERCUTANEOUS PEDICLE SCREW 2 LEVEL N/A 04/08/2021   Procedure: LUMBAR PERCUTANEOUS PEDICLE SCREW LUMBAR ONE TO THREE;  Surgeon: Colon Shove, MD;  Location: [facility] OR;  Service: Neurosurgery;  Laterality: N/A;   Neck fusion  1995   PORTA CATH INSERTION N/A 04/06/2024   Procedure: PORTA CATH INSERTION;  Surgeon: Marea Selinda RAMAN, MD;  Location: [facility] INVASIVE CV LAB;  Service: Cardiovascular;  Laterality: N/A;   TONSILLECTOMY      HEMATOLOGY/ONCOLOGY HISTORY:  Oncology History Overview Note  Diagnosis: Mycosis fungoides with minimal CD30+ (less than 1%) on 06/2022 and 09/2023 biopsy, CD5+ in 50% Date of Diagnosis: 2012 Stage: T1bN0M0B0 (stage 1b) at diagnosis 07/20/22 and 10/14/23- T3N0M0B0 (stage IIB) but in 2025 he had multiple tumors coming up so disease activity had clearly changed but PET scan negative for extracutaneous disease  Regimen:  Steroid creams since 2013 NBUVB phototherapy started 2014-09/2014 Methotrexate 20mg /week 2019-2020 and then not see by derm for several years until10/2023 Radiation: 2400  cGy to his right lower arm lesions and left arm lesion. 08/2022 09/2023 - progressive tumors - biopsy of skin cd30-, CD5+, CD4-/CD8- - no large cell transformation  10/2023-01/28/2024 - gemcitabine  x 4 cycles --> The tumors resolved with gemcitabine  but patch/plaque disease on his skin elsewhere progressed 03/03/24 - bexarotene 225mg /day   # since  2012- Cutaneous T-cell lymphoma-# FEB 2025- PET [facility]-  Extensive cutaneous/subcutaneous hypermetabolic lesions in the bilateral upper and lower extremities, consistent with history of cutaneous T-cell lymphoma. No convincing evidence of nodal or visceral involvement. # MAY 2025- CT chest- IMPRESSION: 1. Numerous new bilateral pulmonary nodules, measuring up to 10 mm. Imaging features are highly concerning for metastatic disease.  # Patient currently status post multiple lines of therapy -most recently noted to have  progression with new tumors despite bexarotene.    # JULY 17th, 2025- pralatrexate - 15mg /m2 for 3/4 weeks.   Mycosis fungoides, lymph nodes of multiple sites (HCC)  05/24/2012 Initial Diagnosis   Mycosis fungoides, unspecified site (HCC)   04/03/2024 Cancer Staging   Staging form: Mycosis Fungoides and Sezary Syndrome, AJCC 8th Edition - Clinical: Stage IVB (cT4, cN0, pM1, B0) - Signed by Rennie Cindy SAUNDERS, MD on 04/03/2024   04/13/2024 -  Chemotherapy   Patient is on Treatment Plan : NON-HODGKINS LYMPHOMA T-CELL Pralatrexate  [id number]       ALLERGIES:  is allergic to sulfa antibiotics.  MEDICATIONS:  Current Outpatient Medications  Medication Sig Dispense Refill   acetaminophen  (TYLENOL ) 500 MG tablet Take 1,000 mg by mouth every 6 (six) hours as needed for moderate pain or headache.     acitretin (SORIATANE) 25 MG capsule Take 25 mg by mouth daily before breakfast.     amLODipine (NORVASC) 5 MG tablet Take 5 mg by mouth daily.     aspirin 81 MG EC tablet Take 81 mg by mouth daily.       atenolol  (TENORMIN ) 25 MG tablet Take by mouth daily.     atorvastatin  (LIPITOR) 20 MG tablet Take 20 mg by mouth daily.     augmented betamethasone  dipropionate (DIPROLENE -AF) 0.05 % ointment Apply topically 2 (two) times daily.     bexarotene (TARGRETIN) 75 MG CAPS capsule Take 3 capsules by mouth daily.     chlorpheniramine-HYDROcodone (TUSSIONEX) 10-8 MG/5ML Take 5 mLs by mouth every 12  (twelve) hours as needed for cough. 140 mL 0   clobetasol cream (TEMOVATE) 0.05 % Apply 1 application topically 2 (two) times daily.     doxycycline (VIBRAMYCIN) 100 MG capsule Take 100 mg by mouth 2 (two) times daily.     folic acid  (FOLVITE ) 1 MG tablet Take 1 tablet (1 mg total) by mouth daily. 90 tablet 6   furosemide  (LASIX ) 20 MG tablet Take 2 tablets (40 mg total) by mouth daily for 7 days. 7 tablet 0   HYDROcodone-acetaminophen  (NORCO/VICODIN) 5-325 MG tablet Take 1 tablet by mouth every 6 (six) hours as needed for moderate pain (pain score 4-6).     leucovorin  (WELLCOVORIN ) 25 MG tablet Take 1 tablet (25 mg total) by mouth 3 (three) times daily. Take Three times a day- Take for 2 consecutive days [a total of 6 doses] beginning 24 hours after each dose of Pralatrexate  chemotherapy. 60 tablet 3   lidocaine -prilocaine  (EMLA ) cream Apply 1 Application topically as needed. Apply to port and cover with saran wrap 1-2 hours prior to port access 30 g 3   methocarbamol  (ROBAXIN ) 500 MG tablet Take 1 tablet (500 mg total) by mouth  every 6 (six) hours as needed for muscle spasms. 30 tablet 3   ondansetron  (ZOFRAN ) 8 MG tablet Take 1 tablet (8 mg total) by mouth every 8 (eight) hours as needed for nausea or vomiting. 60 tablet 1   pantoprazole  (PROTONIX ) 40 MG tablet Take 40 mg by mouth daily as needed (acid reflux).     potassium chloride  SA (KLOR-CON  M) 20 MEQ tablet Take 1 tablet (20 mEq total) by mouth daily for 7 days. 5 tablet 0   prochlorperazine  (COMPAZINE ) 10 MG tablet Take 1 tablet (10 mg total) by mouth every 6 (six) hours as needed for nausea or vomiting. 60 tablet 1   triamcinolone  ointment (KENALOG ) 0.1 % Apply 1 application topically 2 (two) times daily.     No current facility-administered medications for this visit.    VITAL SIGNS: There were no vitals taken for this visit. There were no vitals filed for this visit.  Estimated body mass index is 29.55 kg/m as calculated from the  following:   Height as of 04/13/24: 5' 7 (1.702 m).   Weight as of 04/13/24: 188 lb 11.2 oz (85.6 kg).  LABS: CBC:    Component Value Date/Time   WBC 5.7 04/13/2024 0754   WBC 9.3 10/28/2023 1912   HGB 11.7 (L) 04/13/2024 0754   HCT 37.0 (L) 04/13/2024 0754   PLT 222 04/13/2024 0754   MCV 97.1 04/13/2024 0754   NEUTROABS 3.2 04/13/2024 0754   LYMPHSABS 1.3 04/13/2024 0754   MONOABS 0.8 04/13/2024 0754   EOSABS 0.3 04/13/2024 0754   BASOSABS 0.1 04/13/2024 0754   Comprehensive Metabolic Panel:    Component Value Date/Time   NA 139 04/13/2024 0815   K 3.6 04/13/2024 0815   CL 105 04/13/2024 0815   CO2 25 04/13/2024 0815   BUN 18 04/13/2024 0815   CREATININE 0.81 04/13/2024 0815   GLUCOSE 122 (H) 04/13/2024 0815   CALCIUM  9.1 04/13/2024 0815   AST 20 04/13/2024 0815   ALT 13 04/13/2024 0815   ALKPHOS 82 04/13/2024 0815   BILITOT 0.8 04/13/2024 0815   PROT 6.9 04/13/2024 0815   ALBUMIN 3.6 04/13/2024 0815    RADIOGRAPHIC STUDIES: PERIPHERAL VASCULAR CATHETERIZATION Result Date: 04/06/2024 See surgical note for result.  NM PET Image Restage (PS) Whole Body (F-18 FDG) Result Date: 04/03/2024 CLINICAL DATA:  Subsequent treatment strategy for [facility]-PETCTmycosis fungoides T-cell lymphoma. EXAM: NUCLEAR MEDICINE PET WHOLE BODY TECHNIQUE: 9.8 mCi F-18 FDG was injected intravenously. Full-ring PET imaging was performed from the head to foot after the radiotracer. CT data was obtained and used for attenuation correction and anatomic localization. Fasting blood glucose: 102 mg/dl COMPARISON:  None Available. FINDINGS: HEAD/NECK: Posterior LEFT scalp lesion with SUV max equal 9.2 on image 14. Incidental CT findings: CHEST: Bilateral round pulmonary nodules of varying sizes with metabolic activity. Example nodule in the LEFT upper lobe measures 20 mm with SUV max equal 17.5 on image 77. Smaller example node in the LEFT lower lobe measuring 11 mm SUV max 9.1 approximately 10 nodules within  the lungs. Several cutaneous lesions in the thorax. Example lesion over the LEFT central back SUV max equal 8.0 on image 89. Lesion in the proximal RIGHT upper arm with SUV max equal 4. On image 81. There is skin thickening associated with this lesion measuring 20 mm on image 80. Incidental CT findings: none ABDOMEN/PELVIS: No metabolic lesions in the solid organ in the abdomen pelvis. No abdominopelvic lymphadenopathy. There multiple cutaneous lesions over the LEFT abdominal wall  associated with subtle skin thickening. Example lesion just LEFT of midline the lower abdomen with SUV max equal 7.1 on image 63. Incidental CT findings: none SKELETON: No lesions within the skeleton identified. Incidental CT findings: none EXTREMITIES: Hypermetabolic cutaneous lesions within the medial LEFT thigh. Thickened superficial lesion superior to the medial RIGHT femoral condyle measures 23 mmon image 242 with SUV max equal 8.9 Incidental CT findings: none IMPRESSION: 1. 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 2. Multiple hypermetabolic cutaneous lesions involving the scalp, trunk and extremities consistent with active mycosis fungoides (T-cell lymphoma). 3. No evidence of active lymphoma in the skeleton. Electronically Signed   By: Jackquline Boxer M.D.   On: 04/03/2024 13:24    PERFORMANCE STATUS (ECOG) : 1 - Symptomatic but completely ambulatory  Review of Systems Unless otherwise noted, a complete review of systems is negative.  Physical Exam General: NAD Pulmonary: Unlabored Abdomen: soft, nontender, + bowel sounds GU: no suprapubic tenderness Extremities: no edema, no joint deformities Skin: Rash in various stages of healing across body Neurological: Weakness but otherwise nonfocal  IMPRESSION: I met with patient and wife today.  Introduced palliative care and attempted to establish therapeutic rapport.  Patient described dealing with T-cell lymphoma over many  decades.  However, there has been significant progression recently.  Patient is in agreement with current scope of treatment and is hopeful that current chemotherapy will result in meaningful improvement.  Symptomatically, patient endorses history of painful rashes from his lymphoma across his body.  It sometimes interferes with his sleep and leads to him sleeping in a recliner.  He does have Norco but says that he generally will only use it once or twice a week and primarily to help him sleep.  Patient lives at home with his wife.  He has strong Investment banker, operational and attends church at [employer].  Patient describes having a robust support system.  Patient is interested in establishing advanced directives.  I sent him home with ACP documents and MOST form.  Will refer to ACP clinic.  PLAN: - Continue current scope of treatment - ACP/MOST form reviewed - Referral to ACP clinic - RTC 1 month   Patient expressed understanding and was in agreement with this plan. He also understands that He can call the clinic at any time with any questions, concerns, or complaints.     Time Total: 30 minutes  Visit consisted of counseling and education dealing with the complex and emotionally intense issues of symptom management and palliative care in the setting of serious and potentially life-threatening illness.Greater than 50%  of this time was spent counseling and coordinating care related to the above assessment and plan.  Signed by: Fonda Mower, PhD, NP-C

## 2024-04-19 ENCOUNTER — Inpatient Hospital Stay: Admitting: Licensed Clinical Social Worker

## 2024-04-19 NOTE — Progress Notes (Signed)
 [facility] Clinical Social Work  Clinical Social Work was referred by medical provider for advance directives.  Clinical Social Worker contacted patient by phone to offer support and assess for needs.     Interventions: Provided education and assistance to client regarding Advanced Directives.   Confirmed upcoming appointments per his request.      Follow Up Plan:  CSW will see patient on Thursday, 7/24, at 11:30am, in infusion    Manuel HERO Decoda Van, LCSW  Clinical Social Worker [facility]

## 2024-04-20 ENCOUNTER — Inpatient Hospital Stay

## 2024-04-20 ENCOUNTER — Inpatient Hospital Stay: Admission: RE | Admit: 2024-04-20 | Source: Ambulatory Visit

## 2024-04-20 ENCOUNTER — Ambulatory Visit

## 2024-04-20 ENCOUNTER — Other Ambulatory Visit

## 2024-04-20 VITALS — BP 139/54 | HR 52 | Temp 97.5°F | Resp 18

## 2024-04-20 DIAGNOSIS — C8408 Mycosis fungoides, lymph nodes of multiple sites: Secondary | ICD-10-CM

## 2024-04-20 DIAGNOSIS — Z5111 Encounter for antineoplastic chemotherapy: Secondary | ICD-10-CM | POA: Diagnosis not present

## 2024-04-20 LAB — CMP (CANCER CENTER ONLY)
ALT: 16 U/L (ref 0–44)
AST: 20 U/L (ref 15–41)
Albumin: 3.8 g/dL (ref 3.5–5.0)
Alkaline Phosphatase: 75 U/L (ref 38–126)
Anion gap: 7 (ref 5–15)
BUN: 17 mg/dL (ref 8–23)
CO2: 25 mmol/L (ref 22–32)
Calcium: 9 mg/dL (ref 8.9–10.3)
Chloride: 105 mmol/L (ref 98–111)
Creatinine: 0.88 mg/dL (ref 0.61–1.24)
GFR, Estimated: >60 mL/min (ref >60)
Glucose, Bld: 126 mg/dL — ABNORMAL HIGH (ref 70–99)
Potassium: 3.6 mmol/L (ref 3.5–5.1)
Sodium: 137 mmol/L (ref 135–145)
Total Bilirubin: 0.8 mg/dL (ref 0.0–1.2)
Total Protein: 6.9 g/dL (ref 6.5–8.1)

## 2024-04-20 LAB — CBC WITH DIFFERENTIAL (CANCER CENTER ONLY)
Abs Immature Granulocytes: 0.02 K/uL (ref 0.00–0.07)
Basophils Absolute: 0.1 K/uL (ref 0.0–0.1)
Basophils Relative: 1 %
Eosinophils Absolute: 0.3 K/uL (ref 0.0–0.5)
Eosinophils Relative: 5 %
HCT: 36.1 % — ABNORMAL LOW (ref 39.0–52.0)
Hemoglobin: 11.8 g/dL — ABNORMAL LOW (ref 13.0–17.0)
Immature Granulocytes: 0 %
Lymphocytes Relative: 20 %
Lymphs Abs: 1.1 K/uL (ref 0.7–4.0)
MCH: 30.8 pg (ref 26.0–34.0)
MCHC: 32.7 g/dL (ref 30.0–36.0)
MCV: 94.3 fL (ref 80.0–100.0)
Monocytes Absolute: 0.7 K/uL (ref 0.1–1.0)
Monocytes Relative: 13 %
Neutro Abs: 3.3 K/uL (ref 1.7–7.7)
Neutrophils Relative %: 61 %
Platelet Count: 200 K/uL (ref 150–400)
RBC: 3.83 MIL/uL — ABNORMAL LOW (ref 4.22–5.81)
RDW: 14.8 % (ref 11.5–15.5)
WBC Count: 5.4 K/uL (ref 4.0–10.5)
nRBC: 0 % (ref 0.0–0.2)

## 2024-04-20 MED ORDER — SODIUM CHLORIDE 0.9 % IV SOLN
INTRAVENOUS | Status: DC
  Filled 2024-04-20: qty 250

## 2024-04-20 MED ORDER — PROCHLORPERAZINE MALEATE 10 MG PO TABS
10.0000 mg | ORAL_TABLET | Freq: Once | ORAL | Status: AC
  Administered 2024-04-20: 10 mg via ORAL
  Filled 2024-04-20: qty 1

## 2024-04-20 MED ORDER — PRALATREXATE CHEMO INJECTION 20 MG/ML
15.0000 mg/m2 | Freq: Once | INTRAVENOUS | Status: AC
  Administered 2024-04-20: 30 mg via INTRAVENOUS
  Filled 2024-04-20: qty 1.5

## 2024-04-20 MED ORDER — HEPARIN SOD (PORK) LOCK FLUSH 100 UNIT/ML IV SOLN
500.0000 [IU] | Freq: Once | INTRAVENOUS | Status: AC | PRN
  Administered 2024-04-20: 500 [IU]
  Filled 2024-04-20: qty 5

## 2024-04-20 NOTE — Progress Notes (Signed)
 [facility] CSW Progress Note  Visual merchandiser met with patient to follow-up on advance directives..    Interventions: Provided education and assistance to client regarding Advanced Directives. Gave patient the document to review.  He stated he was feeling drowsy and requested to meet with CSW at another time.  Patient expressed no other needs.      Follow Up Plan:  CSW will see patient on 04/25/24.    Manuel Cook CHRISTELLA Au, LCSW Clinical Social Worker [facility]

## 2024-04-20 NOTE — Patient Instructions (Signed)

## 2024-04-20 NOTE — Patient Instructions (Signed)

## 2024-04-26 ENCOUNTER — Ambulatory Visit: Admitting: Physician Assistant

## 2024-04-27 ENCOUNTER — Inpatient Hospital Stay

## 2024-04-27 ENCOUNTER — Inpatient Hospital Stay: Admitting: Internal Medicine

## 2024-04-27 ENCOUNTER — Other Ambulatory Visit

## 2024-04-27 ENCOUNTER — Encounter: Payer: Self-pay | Admitting: Internal Medicine

## 2024-04-27 VITALS — BP 139/52 | HR 75 | Temp 98.4°F | Resp 18 | Wt 189.3 lb

## 2024-04-27 DIAGNOSIS — C8408 Mycosis fungoides, lymph nodes of multiple sites: Secondary | ICD-10-CM

## 2024-04-27 DIAGNOSIS — Z5111 Encounter for antineoplastic chemotherapy: Secondary | ICD-10-CM | POA: Diagnosis not present

## 2024-04-27 LAB — CBC WITH DIFFERENTIAL (CANCER CENTER ONLY)
Abs Immature Granulocytes: 0.04 K/uL (ref 0.00–0.07)
Basophils Absolute: 0.1 K/uL (ref 0.0–0.1)
Basophils Relative: 1 %
Eosinophils Absolute: 0.2 K/uL (ref 0.0–0.5)
Eosinophils Relative: 4 %
HCT: 36.9 % — ABNORMAL LOW (ref 39.0–52.0)
Hemoglobin: 12 g/dL — ABNORMAL LOW (ref 13.0–17.0)
Immature Granulocytes: 1 %
Lymphocytes Relative: 18 %
Lymphs Abs: 1 K/uL (ref 0.7–4.0)
MCH: 30.5 pg (ref 26.0–34.0)
MCHC: 32.5 g/dL (ref 30.0–36.0)
MCV: 93.9 fL (ref 80.0–100.0)
Monocytes Absolute: 0.8 K/uL (ref 0.1–1.0)
Monocytes Relative: 14 %
Neutro Abs: 3.8 K/uL (ref 1.7–7.7)
Neutrophils Relative %: 62 %
Platelet Count: 184 K/uL (ref 150–400)
RBC: 3.93 MIL/uL — ABNORMAL LOW (ref 4.22–5.81)
RDW: 14.9 % (ref 11.5–15.5)
WBC Count: 5.9 K/uL (ref 4.0–10.5)
nRBC: 0 % (ref 0.0–0.2)

## 2024-04-27 LAB — CMP (CANCER CENTER ONLY)
ALT: 14 U/L (ref 0–44)
AST: 19 U/L (ref 15–41)
Albumin: 3.4 g/dL — ABNORMAL LOW (ref 3.5–5.0)
Alkaline Phosphatase: 73 U/L (ref 38–126)
Anion gap: 7 (ref 5–15)
BUN: 16 mg/dL (ref 8–23)
CO2: 25 mmol/L (ref 22–32)
Calcium: 9.1 mg/dL (ref 8.9–10.3)
Chloride: 106 mmol/L (ref 98–111)
Creatinine: 0.87 mg/dL (ref 0.61–1.24)
GFR, Estimated: >60 mL/min (ref >60)
Glucose, Bld: 108 mg/dL — ABNORMAL HIGH (ref 70–99)
Potassium: 3.5 mmol/L (ref 3.5–5.1)
Sodium: 138 mmol/L (ref 135–145)
Total Bilirubin: 1 mg/dL (ref 0.0–1.2)
Total Protein: 6.9 g/dL (ref 6.5–8.1)

## 2024-04-27 MED ORDER — SODIUM CHLORIDE 0.9 % IV SOLN
INTRAVENOUS | Status: DC
  Filled 2024-04-27: qty 250

## 2024-04-27 MED ORDER — PROCHLORPERAZINE MALEATE 10 MG PO TABS
10.0000 mg | ORAL_TABLET | Freq: Once | ORAL | Status: AC
Start: 2024-04-27 — End: 2024-04-27
  Administered 2024-04-27: 10 mg via ORAL
  Filled 2024-04-27: qty 1

## 2024-04-27 MED ORDER — BENZONATATE 100 MG PO CAPS: 200.0000 mg | ORAL_CAPSULE | Freq: Three times a day (TID) | ORAL | 1 refills | Status: DC | PRN

## 2024-04-27 MED ORDER — PRALATREXATE CHEMO INJECTION 20 MG/ML
15.0000 mg/m2 | Freq: Once | INTRAVENOUS | Status: AC
  Administered 2024-04-27: 30 mg via INTRAVENOUS
  Filled 2024-04-27: qty 1.5

## 2024-04-27 MED ORDER — HEPARIN SOD (PORK) LOCK FLUSH 100 UNIT/ML IV SOLN
500.0000 [IU] | Freq: Once | INTRAVENOUS | Status: AC | PRN
Start: 2024-04-27 — End: 2024-04-27
  Administered 2024-04-27: 500 [IU]
  Filled 2024-04-27: qty 5

## 2024-04-27 NOTE — Progress Notes (Signed)
 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 No convincing evidence of nodal or visceral involvement. # MAY 2025- CT chest- IMPRESSION: 1. Numerous new bilateral pulmonary nodules, measuring up to 10 mm. Imaging features are highly concerning for metastatic disease.  # Patient currently status post multiple lines of therapy -most recently noted to have  progression with new tumors despite bexarotene.    # JULY 17th, 2025- pralatrexate - 15mg /m2 for 3/4 weeks.   Mycosis fungoides, lymph nodes of multiple sites (HCC)  05/24/2012 Initial Diagnosis   Mycosis fungoides, unspecified site (HCC)   04/03/2024  Cancer Staging   Staging form: Mycosis Fungoides and Sezary Syndrome, AJCC 8th Edition - Clinical: Stage IVB (cT4, cN0, pM1, B0) - Signed by Rennie Cindy SAUNDERS, MD on 04/03/2024   04/13/2024 -  Chemotherapy   Patient is on Treatment Plan : NON-HODGKINS LYMPHOMA T-CELL Pralatrexate  q49d        HISTORY OF PRESENTING ILLNESS: Patient ambulating-independently. With wife.   Manuel Cook 78 y.o.  male pleasant patient with a longstanding history of T-cell cutaneous lymphoma/mycosis fungoides status post multiple lines of therapy currently on palliative chemotherapy is here for a follow up.  Notes to have improvement of left arm skin lesions.   Waist band is the worst with the wounds and the itching that accompanies it.   Patient notes have fair Appetite. 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 He can't button his shirts. Denies any nausea   continues to have cough using anti-tussive.   Complains of bilateral leg swelling-on diuretic. Pt states the numbness in his hands is getting worse.   His appetite is fair.  Positive for weight loss.  Review of Systems  Constitutional:  Positive for malaise/fatigue and weight loss. Negative for chills, diaphoresis and fever.  HENT:  Negative for nosebleeds and sore throat.   Eyes:  Negative for double vision.  Respiratory:  Negative for cough, hemoptysis, sputum production, shortness of breath and wheezing.   Cardiovascular:  Negative for chest pain, palpitations, orthopnea and leg swelling.  Gastrointestinal:  Negative for abdominal pain, blood in stool, constipation, diarrhea, heartburn, melena, nausea and vomiting.  Genitourinary:  Negative for dysuria, frequency and urgency.  Musculoskeletal:  Negative for back pain and joint pain.  Skin:  Positive for itching and rash.  Neurological:  Negative for dizziness, tingling, focal weakness, weakness and headaches.  Endo/Heme/Allergies:  Does not bruise/bleed easily.  Psychiatric/Behavioral:  Negative for  depression. The patient is not nervous/anxious and does not have insomnia.     MEDICAL HISTORY:  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HCC)    Pneumonia    Sleep apnea     SURGICAL HISTORY: Past Surgical History:  Procedure Laterality Date   ANTERIOR LAT LUMBAR FUSION N/A 04/08/2021   Procedure: Lumbar one-two Lumbar two-three Anterolateral lumbar interbody fusion with posterior percutaneous fixation Lumbar one to Lumbar three, removal of old hardware at Lumbar four-five;  Surgeon: Colon Shove, MD;  Location: [facility] OR;  Service: Neurosurgery;  Laterality: N/A;   BACK SURGERY     BIOPSY  07/20/2023   Procedure: BIOPSY;  Surgeon: Maryruth Ole DASEN, MD;  Location: [facility] ENDOSCOPY;  Service: Endoscopy;;   CARDIAC CATHETERIZATION     COLONOSCOPY WITH PROPOFOL  N/A 12/22/2016   Procedure: COLONOSCOPY WITH PROPOFOL ;  Surgeon: Gladis RAYMOND Mariner, MD;  Location: [facility] ENDOSCOPY;  Service: Endoscopy;  Laterality: N/A;   CORONARY ANGIOPLASTY WITH STENT PLACEMENT     ESOPHAGOGASTRODUODENOSCOPY N/A 09/12/2021   Procedure: ESOPHAGOGASTRODUODENOSCOPY (EGD);  Surgeon: Onita Elspeth Sharper, DO;  Location: [facility] ENDOSCOPY;  Service: Gastroenterology;  Laterality: N/A;   ESOPHAGOGASTRODUODENOSCOPY (EGD) WITH PROPOFOL  N/A 07/20/2023   Procedure: ESOPHAGOGASTRODUODENOSCOPY (EGD) WITH PROPOFOL ;  Surgeon: Maryruth Ole DASEN, MD;  Location: [facility] ENDOSCOPY;  Service: Endoscopy;  Laterality: N/A;   EYE SURGERY     LUMBAR FUSION  10/28/2009   L3-5 PLIF   LUMBAR PERCUTANEOUS PEDICLE SCREW 2 LEVEL N/A 04/08/2021   Procedure: LUMBAR PERCUTANEOUS PEDICLE SCREW LUMBAR ONE TO THREE;  Surgeon: Colon Shove, MD;  Location: [facility] OR;  Service: Neurosurgery;  Laterality: N/A;   Neck fusion  1995   PORTA CATH INSERTION N/A 04/06/2024   Procedure: PORTA CATH INSERTION;   Surgeon: Marea Selinda RAMAN, MD;  Location: [facility] INVASIVE CV LAB;  Service: Cardiovascular;  Laterality: N/A;   TONSILLECTOMY      SOCIAL HISTORY: Social History   Socioeconomic History   Marital status: Married    Spouse name: Not on file   Number of children: Not on file   Years of education: Not on file   Highest education level: Not on file  Occupational History   Not on file  Tobacco Use   Smoking status: Former   Smokeless tobacco: Never   Tobacco comments:    qiut age 57  Vaping Use   Vaping status: Never Used  Substance and Sexual Activity   Alcohol  use: Yes    Alcohol /week: 0.0 standard drinks of alcohol     Comment: social   Drug use: No   Sexual activity: Not on file  Other Topics Concern   Not on file  Social History Narrative   Not on file   Social Drivers of Health   Financial Resource Strain: Low Risk  (01/18/2024)   Received from [facility]   Overall Financial Resource Strain (CARDIA)    Difficulty of Paying Living Expenses: Not hard at all  Food Insecurity: No Food Insecurity (03/29/2024)   Hunger Vital Sign    Worried About Running Out of Food in the Last Year: Never true    Ran Out of Food in the Last Year: Never true  Transportation  Needs: No Transportation Needs (03/29/2024)   PRAPARE - Administrator, Civil Service (Medical): No    Lack of Transportation (Non-Medical): No  Physical Activity: Not on file  Stress: No Stress Concern Present (06/18/2022)   [employer] of Occupational Health - Occupational Stress Questionnaire    Feeling of Stress : Not at all  Social Connections: Socially Integrated (06/18/2022)   Social Connection and Isolation Panel    Frequency of Communication with Friends and Family: More than three times a week    Frequency of Social Gatherings with Friends and Family: More than three times a week    Attends Religious Services: More than 4 times per year    Active Member of [employer] or Organizations:  Yes    Attends Engineer, structural: More than 4 times per year    Marital Status: Married  Catering manager Violence: Not At Risk (03/29/2024)   Humiliation, Afraid, Rape, and Kick questionnaire    Fear of Current or Ex-Partner: No    Emotionally Abused: No    Physically Abused: No    Sexually Abused: No    FAMILY HISTORY: Family History  Problem Relation Age of Onset   Heart attack Father        Brother   Diabetes Mellitus II Brother    Prostate cancer Neg Hx     ALLERGIES:  is allergic to sulfa antibiotics.  MEDICATIONS:  Current Outpatient Medications  Medication Sig Dispense Refill   acetaminophen  (TYLENOL ) 500 MG tablet Take 1,000 mg by mouth every 6 (six) hours as needed for moderate pain or headache.     amLODipine (NORVASC) 5 MG tablet Take 5 mg by mouth daily.     atenolol  (TENORMIN ) 25 MG tablet Take by mouth daily.     atorvastatin  (LIPITOR) 20 MG tablet Take 20 mg by mouth daily.     augmented betamethasone  dipropionate (DIPROLENE -AF) 0.05 % ointment Apply topically 2 (two) times daily.     benzonatate  (TESSALON ) 100 MG capsule Take 2 capsules (200 mg total) by mouth 3 (three) times daily as needed for cough. 90 capsule 1   chlorpheniramine-HYDROcodone (TUSSIONEX) 10-8 MG/5ML Take 5 mLs by mouth every 12 (twelve) hours as needed for cough. 140 mL 0   clobetasol cream (TEMOVATE) 0.05 % Apply 1 application topically 2 (two) times daily.     doxycycline (VIBRAMYCIN) 100 MG capsule Take 100 mg by mouth 2 (two) times daily.     folic acid  (FOLVITE ) 1 MG tablet Take 1 tablet (1 mg total) by mouth daily. 90 tablet 6   furosemide  (LASIX ) 20 MG tablet Take 2 tablets (40 mg total) by mouth daily for 7 days. 7 tablet 0   HYDROcodone-acetaminophen  (NORCO/VICODIN) 5-325 MG tablet Take 1 tablet by mouth every 6 (six) hours as needed for moderate pain (pain score 4-6).     leucovorin  (WELLCOVORIN ) 25 MG tablet Take 1 tablet (25 mg total) by mouth 3 (three) times daily. Take  Three times a day- Take for 2 consecutive days [a total of 6 doses] beginning 24 hours after each dose of Pralatrexate  chemotherapy. 60 tablet 3   lidocaine -prilocaine  (EMLA ) cream Apply 1 Application topically as needed. Apply to port and cover with saran wrap 1-2 hours prior to port access 30 g 3   methocarbamol  (ROBAXIN ) 500 MG tablet Take 1 tablet (500 mg total) by mouth every 6 (six) hours as needed for muscle spasms. 30 tablet 3   ondansetron  (ZOFRAN ) 8 MG tablet Take 1  tablet (8 mg total) by mouth every 8 (eight) hours as needed for nausea or vomiting. 60 tablet 1   pantoprazole  (PROTONIX ) 40 MG tablet Take 40 mg by mouth daily as needed (acid reflux).     potassium chloride  SA (KLOR-CON  M) 20 MEQ tablet Take 1 tablet (20 mEq total) by mouth daily for 7 days. 5 tablet 0   prochlorperazine  (COMPAZINE ) 10 MG tablet Take 1 tablet (10 mg total) by mouth every 6 (six) hours as needed for nausea or vomiting. 60 tablet 1   triamcinolone  ointment (KENALOG ) 0.1 % Apply 1 application topically 2 (two) times daily.     acitretin (SORIATANE) 25 MG capsule Take 25 mg by mouth daily before breakfast.     bexarotene (TARGRETIN) 75 MG CAPS capsule Take 3 capsules by mouth daily.     No current facility-administered medications for this visit.   Facility-Administered Medications Ordered in Other Visits  Medication Dose Route Frequency Provider Last Rate Last Admin   0.9 %  sodium chloride  infusion   Intravenous Continuous Deneice Wack R, MD       PRALAtrexate  (FOLOTYN ) chemo injection 30 mg  15 mg/m2 (Treatment Plan Recorded) Intravenous Once Donnie Gedeon R, MD        PHYSICAL EXAMINATION:   Vitals:   04/27/24 0906 04/27/24 0930  BP: (!) 139/52   Pulse: 75   Resp:  18  Temp: 98.4 F (36.9 C)   SpO2: 99%    Filed Weights   04/27/24 0906  Weight: 189 lb 4.8 oz (85.9 kg)    Physical Exam Vitals and nursing note reviewed.  HENT:     Head: Normocephalic and atraumatic.      Mouth/Throat:     Pharynx: Oropharynx is clear.  Eyes:     Extraocular Movements: Extraocular movements intact.     Pupils: Pupils are equal, round, and reactive to light.  Cardiovascular:     Rate and Rhythm: Normal rate and regular rhythm.  Pulmonary:     Comments: Decreased breath sounds bilaterally.  Abdominal:     Palpations: Abdomen is soft.  Musculoskeletal:        General: Normal range of motion.     Cervical back: Normal range of motion.  Skin:    General: Skin is warm.  Neurological:     General: No focal deficit present.     Mental Status: He is alert and oriented to person, place, and time.  Psychiatric:        Behavior: Behavior normal.        Judgment: Judgment normal.                 LABORATORY DATA:  I have reviewed the data as listed Lab Results  Component Value Date   WBC 5.9 04/27/2024   HGB 12.0 (L) 04/27/2024   HCT 36.9 (L) 04/27/2024   MCV 93.9 04/27/2024   PLT 184 04/27/2024   Recent Labs    04/13/24 0815 04/20/24 1055 04/27/24 0852  NA 139 137 138  K 3.6 3.6 3.5  CL 105 105 106  CO2 25 25 25   GLUCOSE 122* 126* 108*  BUN 18 17 16   CREATININE 0.81 0.88 0.87  CALCIUM  9.1 9.0 9.1  GFRNONAA >60 >60 >60  PROT 6.9 6.9 6.9  ALBUMIN 3.6 3.8 3.4*  AST 20 20 19   ALT 13 16 14   ALKPHOS 82 75 73  BILITOT 0.8 0.8 1.0    RADIOGRAPHIC STUDIES: I have personally reviewed the radiological images  as listed and agreed with the findings in the report. PERIPHERAL VASCULAR CATHETERIZATION Result Date: 04/06/2024 See surgical note for result.  NM PET Image Restage (PS) Whole Body (F-18 FDG) Result Date: 04/03/2024 CLINICAL DATA:  Subsequent treatment strategy for [facility]-PETCTmycosis fungoides T-cell lymphoma. EXAM: NUCLEAR MEDICINE PET WHOLE BODY TECHNIQUE: 9.8 mCi F-18 FDG was injected intravenously. Full-ring PET imaging was performed from the head to foot after the radiotracer. CT data was obtained and used for attenuation correction and  anatomic localization. Fasting blood glucose: 102 mg/dl COMPARISON:  None Available. FINDINGS: HEAD/NECK: Posterior LEFT scalp lesion with SUV max equal 9.2 on image 14. Incidental CT findings: CHEST: Bilateral round pulmonary nodules of varying sizes with metabolic activity. Example nodule in the LEFT upper lobe measures 20 mm with SUV max equal 17.5 on image 77. Smaller example node in the LEFT lower lobe measuring 11 mm SUV max 9.1 approximately 10 nodules within the lungs. Several cutaneous lesions in the thorax. Example lesion over the LEFT central back SUV max equal 8.0 on image 89. Lesion in the proximal RIGHT upper arm with SUV max equal 4. On image 81. There is skin thickening associated with this lesion measuring 20 mm on image 80. Incidental CT findings: none ABDOMEN/PELVIS: No metabolic lesions in the solid organ in the abdomen pelvis. No abdominopelvic lymphadenopathy. There multiple cutaneous lesions over the LEFT abdominal wall associated with subtle skin thickening. Example lesion just LEFT of midline the lower abdomen with SUV max equal 7.1 on image 63. Incidental CT findings: none SKELETON: No lesions within the skeleton identified. Incidental CT findings: none EXTREMITIES: Hypermetabolic cutaneous lesions within the medial LEFT thigh. Thickened superficial lesion superior to the medial RIGHT femoral condyle measures 23 mmon image 242 with SUV max equal 8.9 Incidental CT findings: none IMPRESSION: 1. 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 2. Multiple hypermetabolic cutaneous lesions involving the scalp, trunk and extremities consistent with active mycosis fungoides (T-cell lymphoma). 3. No evidence of active lymphoma in the skeleton. Electronically Signed   By: Jackquline Boxer M.D.   On: 04/03/2024 13:24     Mycosis fungoides, lymph nodes of multiple sites (HCC) # since 2012-mycosis fungoides -cutaneous T-cell lymphoma-PET scan- JULY 2025-multiple  skin lesions and increasing multiple lung nodules concerning for metastatic disease.  Discussed with Dr.Aleskerov- NO lung biopsy.  STAGE IV- JULY 14th- 2025- pralatrexate  [15mg /m2 for 3/4 weeks].  # Proceed with cycle number 1 day 15 chemotherapy. Labs-CBC/chemistries were reviewed with the patient.  Continue leucovorin  with each pralatrexate  - 25 mg TID; Daily folic acid  supplemental B12.   # Iatrogenic central hypothyroidism:  [sec to bexarotene.]-On thyroid  supplementation.  Monitor closely off bexarotene since their thyroid  issues resolve  add TSH to labs today  # Prostate - pet scan with increased FDG avidity. PSA normal recently. he is setting up to see urologist locally in early July  # ACP: s/p pallaitive referral.   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   # Constipation- add stool softener/laxative as needed.  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   Weekly chemo- q 2 week- MD; 2 cycles-   PS # DISPOSITION: # chemo today-  # add TSH to labs today # Weekly lab- cbc/cmp # as per IS-Dr.B      Above plan of care was discussed with patient/family in detail.  My contact information was given to the patient/family.      Eulla Kochanowski R  Rennie, MD 04/27/2024 10:27 AM

## 2024-04-27 NOTE — Assessment & Plan Note (Addendum)
#   since 2012-mycosis fungoides -cutaneous T-cell lymphoma-PET scan- JULY 2025-multiple skin lesions and increasing multiple lung nodules concerning for metastatic disease.  Discussed with Dr.Aleskerov- NO lung biopsy.  STAGE IV- JULY 14th- 2025- pralatrexate  [15mg /m2 for 3/4 weeks].  # Proceed with cycle number 1 day 15 chemotherapy. Labs-CBC/chemistries were reviewed with the patient.  Continue leucovorin  with each pralatrexate  - 25 mg TID; Daily folic acid  supplemental B12.   # Iatrogenic central hypothyroidism:  [sec to bexarotene.]-On thyroid  supplementation.  Monitor closely off bexarotene since their thyroid  issues resolve  add TSH to labs today  # Prostate - pet scan with increased FDG avidity. PSA normal recently. he is setting up to see urologist locally in early July  # ACP: s/p pallaitive referral.   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   # Constipation- add stool softener/laxative as needed.  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   Weekly chemo- q 2 week- MD; 2 cycles-   PS # DISPOSITION: # chemo today-  # add TSH to labs today # Weekly lab- cbc/cmp # as per IS-Dr.B

## 2024-04-27 NOTE — Progress Notes (Signed)
 Joints ache more after treatment. Still has new nodules popping up on skin. Appetite is 50 %.  Waist band is the worst with the wounds and the itching that accompanies it. 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 He can't button his shirts. Discussed starting a stool softener for constipation. Denies any nausea.

## 2024-04-28 ENCOUNTER — Ambulatory Visit: Admit: 2024-04-28 | Admitting: Pulmonary Disease

## 2024-04-28 SURGERY — BRONCHOSCOPY, WITH EBUS
Anesthesia: General

## 2024-05-03 ENCOUNTER — Other Ambulatory Visit: Payer: Self-pay | Admitting: *Deleted

## 2024-05-03 ENCOUNTER — Encounter: Attending: Physician Assistant | Admitting: Physician Assistant

## 2024-05-03 DIAGNOSIS — I10 Essential (primary) hypertension: Secondary | ICD-10-CM | POA: Diagnosis not present

## 2024-05-03 DIAGNOSIS — C8408 Mycosis fungoides, lymph nodes of multiple sites: Secondary | ICD-10-CM | POA: Diagnosis not present

## 2024-05-03 DIAGNOSIS — C9150 Adult T-cell lymphoma/leukemia (HTLV-1-associated) not having achieved remission: Secondary | ICD-10-CM | POA: Diagnosis not present

## 2024-05-03 DIAGNOSIS — L97823 Non-pressure chronic ulcer of other part of left lower leg with necrosis of muscle: Secondary | ICD-10-CM | POA: Insufficient documentation

## 2024-05-03 DIAGNOSIS — I2510 Atherosclerotic heart disease of native coronary artery without angina pectoris: Secondary | ICD-10-CM | POA: Insufficient documentation

## 2024-05-03 DIAGNOSIS — L97812 Non-pressure chronic ulcer of other part of right lower leg with fat layer exposed: Secondary | ICD-10-CM | POA: Diagnosis not present

## 2024-05-04 ENCOUNTER — Inpatient Hospital Stay: Attending: Internal Medicine

## 2024-05-04 DIAGNOSIS — C8408 Mycosis fungoides, lymph nodes of multiple sites: Secondary | ICD-10-CM | POA: Diagnosis not present

## 2024-05-04 DIAGNOSIS — Z79899 Other long term (current) drug therapy: Secondary | ICD-10-CM | POA: Diagnosis not present

## 2024-05-04 DIAGNOSIS — Z5111 Encounter for antineoplastic chemotherapy: Secondary | ICD-10-CM | POA: Diagnosis not present

## 2024-05-04 DIAGNOSIS — Z79620 Long term (current) use of immunosuppressive biologic: Secondary | ICD-10-CM | POA: Diagnosis not present

## 2024-05-04 LAB — CBC WITH DIFFERENTIAL (CANCER CENTER ONLY)
Abs Immature Granulocytes: 0.04 K/uL (ref 0.00–0.07)
Basophils Absolute: 0.1 K/uL (ref 0.0–0.1)
Basophils Relative: 1 %
Eosinophils Absolute: 0.2 K/uL (ref 0.0–0.5)
Eosinophils Relative: 4 %
HCT: 38.7 % — ABNORMAL LOW (ref 39.0–52.0)
Hemoglobin: 12.5 g/dL — ABNORMAL LOW (ref 13.0–17.0)
Immature Granulocytes: 1 %
Lymphocytes Relative: 16 %
Lymphs Abs: 0.9 K/uL (ref 0.7–4.0)
MCH: 30.9 pg (ref 26.0–34.0)
MCHC: 32.3 g/dL (ref 30.0–36.0)
MCV: 95.6 fL (ref 80.0–100.0)
Monocytes Absolute: 0.8 K/uL (ref 0.1–1.0)
Monocytes Relative: 14 %
Neutro Abs: 3.7 K/uL (ref 1.7–7.7)
Neutrophils Relative %: 64 %
Platelet Count: 188 K/uL (ref 150–400)
RBC: 4.05 MIL/uL — ABNORMAL LOW (ref 4.22–5.81)
RDW: 15.5 % (ref 11.5–15.5)
WBC Count: 5.6 K/uL (ref 4.0–10.5)
nRBC: 0 % (ref 0.0–0.2)

## 2024-05-04 LAB — CMP (CANCER CENTER ONLY)
ALT: 15 U/L (ref 0–44)
AST: 21 U/L (ref 15–41)
Albumin: 3.7 g/dL (ref 3.5–5.0)
Alkaline Phosphatase: 76 U/L (ref 38–126)
Anion gap: 9 (ref 5–15)
BUN: 13 mg/dL (ref 8–23)
CO2: 22 mmol/L (ref 22–32)
Calcium: 9.3 mg/dL (ref 8.9–10.3)
Chloride: 109 mmol/L (ref 98–111)
Creatinine: 0.8 mg/dL (ref 0.61–1.24)
GFR, Estimated: >60 mL/min (ref >60)
Glucose, Bld: 116 mg/dL — ABNORMAL HIGH (ref 70–99)
Potassium: 3.6 mmol/L (ref 3.5–5.1)
Sodium: 140 mmol/L (ref 135–145)
Total Bilirubin: 0.8 mg/dL (ref 0.0–1.2)
Total Protein: 6.9 g/dL (ref 6.5–8.1)

## 2024-05-05 ENCOUNTER — Inpatient Hospital Stay

## 2024-05-05 ENCOUNTER — Encounter: Payer: Self-pay | Admitting: Internal Medicine

## 2024-05-05 DIAGNOSIS — C9150 Adult T-cell lymphoma/leukemia (HTLV-1-associated) not having achieved remission: Secondary | ICD-10-CM | POA: Diagnosis not present

## 2024-05-05 DIAGNOSIS — Z9181 History of falling: Secondary | ICD-10-CM | POA: Diagnosis not present

## 2024-05-05 DIAGNOSIS — Z7982 Long term (current) use of aspirin: Secondary | ICD-10-CM | POA: Diagnosis not present

## 2024-05-05 DIAGNOSIS — L97822 Non-pressure chronic ulcer of other part of left lower leg with fat layer exposed: Secondary | ICD-10-CM | POA: Diagnosis not present

## 2024-05-05 DIAGNOSIS — I2510 Atherosclerotic heart disease of native coronary artery without angina pectoris: Secondary | ICD-10-CM | POA: Diagnosis not present

## 2024-05-05 DIAGNOSIS — Z792 Long term (current) use of antibiotics: Secondary | ICD-10-CM | POA: Diagnosis not present

## 2024-05-05 DIAGNOSIS — I10 Essential (primary) hypertension: Secondary | ICD-10-CM | POA: Diagnosis not present

## 2024-05-05 NOTE — Progress Notes (Signed)
 [facility] CSW Progress Note  Clinical Child psychotherapist contacted patient by phone to follow-up on advance directive.  He reports minimal relief and continues to be uncomfortable due to his wounds.  Interventions: Provided education and assistance to client regarding Advanced Directives.  Also provided active listening and supportive counseling.      Follow Up Plan:  CSW will see patient on 8/14 during infusion.    Macario CHRISTELLA Au, LCSW Clinical Social Worker [facility]

## 2024-05-10 ENCOUNTER — Ambulatory Visit: Admitting: Physician Assistant

## 2024-05-10 DIAGNOSIS — S81001A Unspecified open wound, right knee, initial encounter: Secondary | ICD-10-CM | POA: Diagnosis not present

## 2024-05-10 DIAGNOSIS — S21202A Unspecified open wound of left back wall of thorax without penetration into thoracic cavity, initial encounter: Secondary | ICD-10-CM | POA: Diagnosis not present

## 2024-05-11 ENCOUNTER — Encounter: Payer: Self-pay | Admitting: Internal Medicine

## 2024-05-11 ENCOUNTER — Inpatient Hospital Stay (HOSPITAL_BASED_OUTPATIENT_CLINIC_OR_DEPARTMENT_OTHER): Admitting: Internal Medicine

## 2024-05-11 ENCOUNTER — Inpatient Hospital Stay

## 2024-05-11 ENCOUNTER — Other Ambulatory Visit: Payer: Self-pay

## 2024-05-11 VITALS — BP 139/49 | HR 63 | Temp 97.8°F | Resp 18 | Ht 67.0 in | Wt 190.5 lb

## 2024-05-11 VITALS — BP 132/50 | HR 56 | Resp 18

## 2024-05-11 DIAGNOSIS — Z5111 Encounter for antineoplastic chemotherapy: Secondary | ICD-10-CM | POA: Diagnosis not present

## 2024-05-11 DIAGNOSIS — C8408 Mycosis fungoides, lymph nodes of multiple sites: Secondary | ICD-10-CM

## 2024-05-11 LAB — CMP (CANCER CENTER ONLY)
ALT: 14 U/L (ref 0–44)
AST: 17 U/L (ref 15–41)
Albumin: 3.5 g/dL (ref 3.5–5.0)
Alkaline Phosphatase: 79 U/L (ref 38–126)
Anion gap: 7 (ref 5–15)
BUN: 14 mg/dL (ref 8–23)
CO2: 27 mmol/L (ref 22–32)
Calcium: 9.3 mg/dL (ref 8.9–10.3)
Chloride: 105 mmol/L (ref 98–111)
Creatinine: 0.83 mg/dL (ref 0.61–1.24)
GFR, Estimated: >60 mL/min (ref >60)
Glucose, Bld: 129 mg/dL — ABNORMAL HIGH (ref 70–99)
Potassium: 3.9 mmol/L (ref 3.5–5.1)
Sodium: 139 mmol/L (ref 135–145)
Total Bilirubin: 0.9 mg/dL (ref 0.0–1.2)
Total Protein: 7 g/dL (ref 6.5–8.1)

## 2024-05-11 LAB — CBC WITH DIFFERENTIAL (CANCER CENTER ONLY)
Abs Immature Granulocytes: 0.05 K/uL (ref 0.00–0.07)
Basophils Absolute: 0.1 K/uL (ref 0.0–0.1)
Basophils Relative: 1 %
Eosinophils Absolute: 0.3 K/uL (ref 0.0–0.5)
Eosinophils Relative: 4 %
HCT: 39 % (ref 39.0–52.0)
Hemoglobin: 12.7 g/dL — ABNORMAL LOW (ref 13.0–17.0)
Immature Granulocytes: 1 %
Lymphocytes Relative: 15 %
Lymphs Abs: 1 K/uL (ref 0.7–4.0)
MCH: 30.8 pg (ref 26.0–34.0)
MCHC: 32.6 g/dL (ref 30.0–36.0)
MCV: 94.7 fL (ref 80.0–100.0)
Monocytes Absolute: 0.8 K/uL (ref 0.1–1.0)
Monocytes Relative: 13 %
Neutro Abs: 4.1 K/uL (ref 1.7–7.7)
Neutrophils Relative %: 66 %
Platelet Count: 207 K/uL (ref 150–400)
RBC: 4.12 MIL/uL — ABNORMAL LOW (ref 4.22–5.81)
RDW: 15.2 % (ref 11.5–15.5)
WBC Count: 6.3 K/uL (ref 4.0–10.5)
nRBC: 0 % (ref 0.0–0.2)

## 2024-05-11 LAB — TSH: TSH: 2.911 u[IU]/mL (ref 0.350–4.500)

## 2024-05-11 MED ORDER — PROCHLORPERAZINE MALEATE 10 MG PO TABS
10.0000 mg | ORAL_TABLET | Freq: Once | ORAL | Status: AC
  Administered 2024-05-11: 10 mg via ORAL
  Filled 2024-05-11: qty 1

## 2024-05-11 MED ORDER — PREDNISONE 20 MG PO TABS: ORAL_TABLET | ORAL | 0 refills | Status: DC

## 2024-05-11 MED ORDER — SODIUM CHLORIDE 0.9 % IV SOLN
INTRAVENOUS | Status: DC
Start: 2024-05-11 — End: 2024-05-11
  Filled 2024-05-11: qty 250

## 2024-05-11 MED ORDER — MONTELUKAST SODIUM 10 MG PO TABS: 10.0000 mg | ORAL_TABLET | Freq: Every day | ORAL | 3 refills | Status: DC

## 2024-05-11 MED ORDER — PRALATREXATE CHEMO INJECTION 20 MG/ML
15.0000 mg/m2 | Freq: Once | INTRAVENOUS | Status: AC
  Administered 2024-05-11: 30 mg via INTRAVENOUS
  Filled 2024-05-11: qty 1.5

## 2024-05-11 NOTE — Progress Notes (Signed)
 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 No convincing evidence of nodal or visceral involvement. # MAY 2025- CT chest- IMPRESSION: 1. Numerous new bilateral pulmonary nodules, measuring up to 10 mm. Imaging features are highly concerning for metastatic disease.  # Patient currently status post multiple lines of therapy -most recently noted to have  progression with new tumors despite bexarotene .    # JULY 17th, 2025- pralatrexate - 15mg /m2 for 3/4 weeks.   Mycosis fungoides, lymph nodes of multiple sites (HCC)  05/24/2012 Initial Diagnosis   Mycosis fungoides, unspecified site (HCC)   04/03/2024  Cancer Staging   Staging form: Mycosis Fungoides and Sezary Syndrome, AJCC 8th Edition - Clinical: Stage IVB (cT4, cN0, pM1, B0) - Signed by Rennie Cindy SAUNDERS, MD on 04/03/2024   04/13/2024 -  Chemotherapy   Patient is on Treatment Plan : NON-HODGKINS LYMPHOMA T-CELL Pralatrexate  q49d        HISTORY OF PRESENTING ILLNESS: Patient ambulating-independently. With wife.   Manuel Cook 78 y.o.  male pleasant patient with a longstanding history of T-cell cutaneous lymphoma/mycosis fungoides status post multiple lines of therapy currently on palliative chemotherapy is here for a follow up.  Patient states he's having some pain today, continues to have cough using anti-tussive.   Patient continues  the right thumb and right knee. He also is having severe itching that he would like insight about.   Notes to have improvement of left arm skin lesions.   Patient notes have fair Appetite.   Complains of bilateral leg swelling-on diuretic. Pt states the numbness in his hands is getting worse.   His appetite is fair.  Positive for weight loss.  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   Review of Systems  Constitutional:  Positive for malaise/fatigue and weight loss. Negative for chills, diaphoresis and fever.  HENT:  Negative for nosebleeds and sore throat.   Eyes:  Negative for double vision.  Respiratory:  Negative for cough, hemoptysis, sputum production, shortness of breath and wheezing.   Cardiovascular:  Negative for chest pain, palpitations, orthopnea and leg swelling.  Gastrointestinal:  Negative for abdominal pain, blood in stool, constipation, diarrhea, heartburn, melena, nausea and vomiting.  Genitourinary:  Negative for dysuria, frequency and urgency.  Musculoskeletal:  Negative for back pain and joint pain.  Skin:  Positive for itching and rash.  Neurological:  Negative for  dizziness, tingling, focal weakness, weakness and headaches.  Endo/Heme/Allergies:  Does not bruise/bleed easily.   Psychiatric/Behavioral:  Negative for depression. The patient is not nervous/anxious and does not have insomnia.     MEDICAL HISTORY:  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HCC)    Pneumonia    Sleep apnea     SURGICAL HISTORY: Past Surgical History:  Procedure Laterality Date   ANTERIOR LAT LUMBAR FUSION N/A 04/08/2021   Procedure: Lumbar one-two Lumbar two-three Anterolateral lumbar interbody fusion with posterior percutaneous fixation Lumbar one to Lumbar three, removal of old hardware at Lumbar four-five;  Surgeon: Colon Shove, MD;  Location: [facility] OR;  Service: Neurosurgery;  Laterality: N/A;   BACK SURGERY     BIOPSY  07/20/2023   Procedure: BIOPSY;  Surgeon: Maryruth Ole DASEN, MD;  Location: [facility] ENDOSCOPY;  Service: Endoscopy;;   CARDIAC CATHETERIZATION     COLONOSCOPY WITH PROPOFOL  N/A 12/22/2016   Procedure: COLONOSCOPY WITH PROPOFOL ;  Surgeon: Gladis RAYMOND Mariner, MD;  Location: [facility] ENDOSCOPY;  Service: Endoscopy;  Laterality: N/A;   CORONARY ANGIOPLASTY WITH STENT PLACEMENT     ESOPHAGOGASTRODUODENOSCOPY N/A 09/12/2021   Procedure: ESOPHAGOGASTRODUODENOSCOPY (EGD);  Surgeon: Onita Elspeth Sharper, DO;  Location: [facility] ENDOSCOPY;  Service: Gastroenterology;  Laterality: N/A;   ESOPHAGOGASTRODUODENOSCOPY (EGD) WITH PROPOFOL  N/A 07/20/2023   Procedure: ESOPHAGOGASTRODUODENOSCOPY (EGD) WITH PROPOFOL ;  Surgeon: Maryruth Ole DASEN, MD;  Location: [facility] ENDOSCOPY;  Service: Endoscopy;  Laterality: N/A;   EYE SURGERY     LUMBAR FUSION  10/28/2009   L3-5 PLIF   LUMBAR PERCUTANEOUS PEDICLE SCREW 2 LEVEL N/A 04/08/2021   Procedure: LUMBAR PERCUTANEOUS PEDICLE SCREW LUMBAR ONE TO THREE;  Surgeon: Colon Shove, MD;  Location: [facility] OR;  Service: Neurosurgery;  Laterality: N/A;   Neck fusion  1995   PORTA CATH INSERTION N/A  04/06/2024   Procedure: PORTA CATH INSERTION;  Surgeon: Marea Selinda RAMAN, MD;  Location: [facility] INVASIVE CV LAB;  Service: Cardiovascular;  Laterality: N/A;   TONSILLECTOMY      SOCIAL HISTORY: Social History   Socioeconomic History   Marital status: Married    Spouse name: Not on file   Number of children: Not on file   Years of education: Not on file   Highest education level: Not on file  Occupational History   Not on file  Tobacco Use   Smoking status: Former   Smokeless tobacco: Never   Tobacco comments:    qiut age 67  Vaping Use   Vaping status: Never Used  Substance and Sexual Activity   Alcohol  use: Yes    Alcohol /week: 0.0 standard drinks of alcohol     Comment: social   Drug use: No   Sexual activity: Not on file  Other Topics Concern   Not on file  Social History Narrative   Not on file   Social Drivers of Health   Financial Resource Strain: Low Risk  (01/18/2024)   Received from [facility]   Overall Financial Resource Strain (CARDIA)    Difficulty of Paying Living Expenses: Not hard at all  Food Insecurity: No Food Insecurity (03/29/2024)   Hunger Vital Sign    Worried About Running Out of Food in the Last Year: Never true    Ran Out of Food  in the Last Year: Never true  Transportation Needs: No Transportation Needs (03/29/2024)   PRAPARE - Administrator, Civil Service (Medical): No    Lack of Transportation (Non-Medical): No  Physical Activity: Not on file  Stress: No Stress Concern Present (06/18/2022)   [employer] of Occupational Health - Occupational Stress Questionnaire    Feeling of Stress : Not at all  Social Connections: Socially Integrated (06/18/2022)   Social Connection and Isolation Panel    Frequency of Communication with Friends and Family: More than three times a week    Frequency of Social Gatherings with Friends and Family: More than three times a week    Attends Religious Services: More than 4 times per year     Active Member of [employer] or Organizations: Yes    Attends Engineer, structural: More than 4 times per year    Marital Status: Married  Catering manager Violence: Not At Risk (03/29/2024)   Humiliation, Afraid, Rape, and Kick questionnaire    Fear of Current or Ex-Partner: No    Emotionally Abused: No    Physically Abused: No    Sexually Abused: No    FAMILY HISTORY: Family History  Problem Relation Age of Onset   Heart attack Father        Brother   Diabetes Mellitus II Brother    Prostate cancer Neg Hx     ALLERGIES:  is allergic to sulfa antibiotics.  MEDICATIONS:  Current Outpatient Medications  Medication Sig Dispense Refill   acetaminophen  (TYLENOL ) 500 MG tablet Take 1,000 mg by mouth every 6 (six) hours as needed for moderate pain or headache.     amLODipine (NORVASC) 5 MG tablet Take 5 mg by mouth daily.     atenolol  (TENORMIN ) 25 MG tablet Take by mouth daily.     atorvastatin  (LIPITOR) 20 MG tablet Take 20 mg by mouth daily.     augmented betamethasone  dipropionate (DIPROLENE -AF) 0.05 % ointment Apply topically 2 (two) times daily.     benzonatate  (TESSALON ) 100 MG capsule Take 2 capsules (200 mg total) by mouth 3 (three) times daily as needed for cough. 90 capsule 1   clobetasol cream (TEMOVATE) 0.05 % Apply 1 application topically 2 (two) times daily.     doxycycline (VIBRAMYCIN) 100 MG capsule Take 100 mg by mouth 2 (two) times daily.     folic acid  (FOLVITE ) 1 MG tablet Take 1 tablet (1 mg total) by mouth daily. 90 tablet 6   furosemide  (LASIX ) 20 MG tablet Take 2 tablets (40 mg total) by mouth daily for 7 days. 7 tablet 0   HYDROcodone-acetaminophen  (NORCO/VICODIN) 5-325 MG tablet Take 1 tablet by mouth every 6 (six) hours as needed for moderate pain (pain score 4-6).     leucovorin  (WELLCOVORIN ) 25 MG tablet Take 1 tablet (25 mg total) by mouth 3 (three) times daily. Take Three times a day- Take for 2 consecutive days [a total of 6 doses] beginning 24  hours after each dose of Pralatrexate  chemotherapy. 60 tablet 3   lidocaine -prilocaine  (EMLA ) cream Apply 1 Application topically as needed. Apply to port and cover with saran wrap 1-2 hours prior to port access 30 g 3   montelukast  (SINGULAIR ) 10 MG tablet Take 1 tablet (10 mg total) by mouth at bedtime. 30 tablet 3   ondansetron  (ZOFRAN ) 8 MG tablet Take 1 tablet (8 mg total) by mouth every 8 (eight) hours as needed for nausea or vomiting. 60 tablet 1  pantoprazole  (PROTONIX ) 40 MG tablet Take 40 mg by mouth daily as needed (acid reflux).     potassium chloride  SA (KLOR-CON  M) 20 MEQ tablet Take 1 tablet (20 mEq total) by mouth daily for 7 days. 5 tablet 0   predniSONE  (DELTASONE ) 20 MG tablet Once a day with food- 30 tablet 0   prochlorperazine  (COMPAZINE ) 10 MG tablet Take 1 tablet (10 mg total) by mouth every 6 (six) hours as needed for nausea or vomiting. 60 tablet 1   acitretin (SORIATANE) 25 MG capsule Take 25 mg by mouth daily before breakfast. (Patient not taking: Reported on 05/11/2024)     bexarotene  (TARGRETIN ) 75 MG CAPS capsule Take 3 capsules by mouth daily. (Patient not taking: Reported on 05/11/2024)     chlorpheniramine-HYDROcodone (TUSSIONEX) 10-8 MG/5ML Take 5 mLs by mouth every 12 (twelve) hours as needed for cough. (Patient not taking: Reported on 05/11/2024) 140 mL 0   methocarbamol  (ROBAXIN ) 500 MG tablet Take 1 tablet (500 mg total) by mouth every 6 (six) hours as needed for muscle spasms. (Patient not taking: Reported on 05/11/2024) 30 tablet 3   triamcinolone  ointment (KENALOG ) 0.1 % Apply 1 application topically 2 (two) times daily. (Patient not taking: Reported on 05/11/2024)     No current facility-administered medications for this visit.   Facility-Administered Medications Ordered in Other Visits  Medication Dose Route Frequency Provider Last Rate Last Admin   0.9 %  sodium chloride  infusion   Intravenous Continuous Kaarin Pardy R, MD 10 mL/hr at 05/11/24 0953 New  Bag at 05/11/24 0953    PHYSICAL EXAMINATION:   Vitals:   05/11/24 0907  BP: (!) 139/49  Pulse: 63  Resp: 18  Temp: 97.8 F (36.6 C)  SpO2: 100%   Filed Weights   05/11/24 0907  Weight: 190 lb 8 oz (86.4 kg)    Physical Exam Vitals and nursing note reviewed.  HENT:     Head: Normocephalic and atraumatic.     Mouth/Throat:     Pharynx: Oropharynx is clear.  Eyes:     Extraocular Movements: Extraocular movements intact.     Pupils: Pupils are equal, round, and reactive to light.  Cardiovascular:     Rate and Rhythm: Normal rate and regular rhythm.  Pulmonary:     Comments: Decreased breath sounds bilaterally.  Abdominal:     Palpations: Abdomen is soft.  Musculoskeletal:        General: Normal range of motion.     Cervical back: Normal range of motion.  Skin:    General: Skin is warm.  Neurological:     General: No focal deficit present.     Mental Status: He is alert and oriented to person, place, and time.  Psychiatric:        Behavior: Behavior normal.        Judgment: Judgment normal.              AUG 14th, 2025- BEFORE CYCLE # 2                LABORATORY DATA:  I have reviewed the data as listed Lab Results  Component Value Date   WBC 6.3 05/11/2024   HGB 12.7 (L) 05/11/2024   HCT 39.0 05/11/2024   MCV 94.7 05/11/2024   PLT 207 05/11/2024   Recent Labs    04/27/24 0852 05/04/24 0853 05/11/24 0838  NA 138 140 139  K 3.5 3.6 3.9  CL 106 109 105  CO2 25 22 27  GLUCOSE 108* 116* 129*  BUN 16 13 14   CREATININE 0.87 0.80 0.83  CALCIUM  9.1 9.3 9.3  GFRNONAA >60 >60 >60  PROT 6.9 6.9 7.0  ALBUMIN 3.4* 3.7 3.5  AST 19 21 17   ALT 14 15 14   ALKPHOS 73 76 79  BILITOT 1.0 0.8 0.9    RADIOGRAPHIC STUDIES: I have personally reviewed the radiological images as listed and agreed with the findings in the report. No results found.    Mycosis fungoides, lymph nodes of multiple sites (HCC) # since 2012-mycosis fungoides  -cutaneous T-cell lymphoma-PET scan- JULY 2025-multiple skin lesions and increasing multiple lung nodules concerning for metastatic disease.  Discussed with Dr.Aleskerov- NO lung biopsy.  STAGE IV- JULY 14th- 2025- pralatrexate  [15mg /m2 for 3/4 weeks].  # Proceed with cycle number 2 day 1 chemotherapy. Labs-CBC/chemistries were reviewed with the patient.  Continue leucovorin  with each pralatrexate  - 25 mg TID; Daily folic acid  supplemental B12.  Clinically stable-   # Skin itching- recommend prednisone   20 mg/day; Clairtin daily- singulair -   # Iatrogenic central hypothyroidism:  [sec to bexarotene .]-On thyroid  supplementation.  Monitor closely off bexarotene  since their thyroid  issues resolve  add TSH to labs today  # Prostate - pet scan with increased FDG avidity. PSA normal recently. he is setting up to see urologist locally in early July  # ACP: s/p pallaitive referral.   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   # Constipation- add stool softener/laxative as needed.  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   Weekly chemo- q 2 week- MD; 2 cycles-   PS # DISPOSITION: # chemo today-  # add TSH to labs today # Weekly lab- cbc/cmp # as per IS-Dr.B      Above plan of care was discussed with patient/family in detail.  My contact information was given to the patient/family.      Cindy JONELLE Joe, MD 05/11/2024 10:36 AM

## 2024-05-11 NOTE — Patient Instructions (Signed)
#   Clairtin daily

## 2024-05-11 NOTE — Progress Notes (Signed)
 Patient states he's having some pain today, located in the right thumb and right knee. He also is having severe itching that he would like insight about.

## 2024-05-11 NOTE — Assessment & Plan Note (Addendum)
#   since 2012-mycosis fungoides -cutaneous T-cell lymphoma-PET scan- JULY 2025-multiple skin lesions and increasing multiple lung nodules concerning for metastatic disease.  Discussed with Dr.Aleskerov- NO lung biopsy.  STAGE IV- JULY 14th- 2025- pralatrexate  [15mg /m2 for 3/4 weeks].  # Proceed with cycle number 2 day 1 chemotherapy. Labs-CBC/chemistries were reviewed with the patient.  Continue leucovorin  with each pralatrexate  - 25 mg TID; Daily folic acid  supplemental B12.  Clinically stable-   # Skin itching- recommend prednisone   20 mg/day; Clairtin daily- singulair -   # Iatrogenic central hypothyroidism:  [sec to bexarotene .]-On thyroid  supplementation.  Monitor closely off bexarotene  since their thyroid  issues resolve  add TSH to labs today  # Prostate - pet scan with increased FDG avidity. PSA normal recently. he is setting up to see urologist locally in early July  # ACP: s/p pallaitive referral.   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   # Constipation- add stool softener/laxative as needed.  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   Weekly chemo- q 2 week- MD; 2 cycles-   PS # DISPOSITION: # chemo today-  # add TSH to labs today # Weekly lab- cbc/cmp # as per IS-Dr.B   05/12/2024- Will add bexarotene  225 mg [75 mg 3 pills day along with Pralatrexate ] chemo- discussed with pharmacy- GB

## 2024-05-12 ENCOUNTER — Other Ambulatory Visit (HOSPITAL_COMMUNITY): Payer: Self-pay

## 2024-05-12 ENCOUNTER — Telehealth: Payer: Self-pay | Admitting: Pharmacist

## 2024-05-12 ENCOUNTER — Encounter: Payer: Self-pay | Admitting: Internal Medicine

## 2024-05-12 ENCOUNTER — Telehealth: Payer: Self-pay | Admitting: Pharmacy Technician

## 2024-05-12 DIAGNOSIS — C8408 Mycosis fungoides, lymph nodes of multiple sites: Secondary | ICD-10-CM

## 2024-05-12 MED ORDER — BEXAROTENE 75 MG PO CAPS: 225.0000 mg | ORAL_CAPSULE | Freq: Every day | ORAL | 2 refills | Status: DC

## 2024-05-12 NOTE — Telephone Encounter (Signed)
 Oral Oncology Patient Advocate Encounter  After completing a benefits investigation, prior authorization for Bexarotene  is not required at this time through [facility].  Patient's copay is $1,169.43.     Myan Suit (Patty) Chet Burnet, CPhT  [facility], [place], [place] Oral Chemotherapy Patient Advocate Specialist III Phone: [phone]  Fax: [phone]

## 2024-05-12 NOTE — Telephone Encounter (Signed)
 Patient reports having full bottle of Targretin  at home. He will get started back on the Targretin  tomorrow 05/13/24 bobette by MD).

## 2024-05-12 NOTE — Telephone Encounter (Signed)
 Clinical Pharmacist Practitioner Encounter   Received restart prescription for Targretin  (bexarotene ) for the treatment of cutaneous T-cell lymphoma in conjunction with pralatrexate  and leucovorin , planned duration until disease progression or unacceptable drug toxicity.  Patient was previously on medication from [facility] although currently on hold. Patient has transitioned care back to [facility] and Dr. Rennie would like for patient to resume treatment.  Prescription dose and frequency assessed.   Patient will need lipid, thyroid , and hepatic monitoring while on treatment.  Fasting blood lipid profile: every 8 weeks as clinically indicated Liver function tests: every 8 weeks thereafter Thyroid  function tests; at baseline and periodically during treatment  Current medication list in Epic reviewed, several DDIs with bexarotene  identified: Bexarotene  may decrease the serum concentration of atorvastatin , amlodipine, hydrocodone, prednisone , and montelukast . Monitor patient for decreased effectiveness of the listed medications. No baseline dose adjustment needed.   Evaluated chart and no patient barriers to medication adherence identified.   Prescription has been e-scribed to the [facility] for benefits analysis and approval.  Oral Oncology Clinic will continue to follow for insurance authorization, copayment issues, initial counseling and start date.   Mylee Falin N. Sharmaine Bain, PharmD, BCOP, CPP Hematology/Oncology Clinical Pharmacist [facility] Oral Chemotherapy Navigation Clinic [phone]  05/12/2024 1:10 PM

## 2024-05-12 NOTE — Addendum Note (Signed)
 Addended by: RENNIE LIGHTER R on: 05/12/2024 12:55 PM   Modules accepted: Orders

## 2024-05-15 ENCOUNTER — Telehealth: Payer: Self-pay | Admitting: *Deleted

## 2024-05-15 NOTE — Telephone Encounter (Signed)
 Per Almarie talking to the patient says that his itching is worse and he has taken all the medicines he was given to help with itching he since he has started back on Targretin  .  He is also having a lot more pain on the skin of the thumb, as well as the right knee skin area problem.  The nurse wanted to see if the doctor could think of something else to help him with his pain and the itching.

## 2024-05-17 ENCOUNTER — Telehealth: Payer: Self-pay | Admitting: Pharmacy Technician

## 2024-05-17 ENCOUNTER — Encounter: Admitting: Physician Assistant

## 2024-05-17 ENCOUNTER — Telehealth: Payer: Self-pay | Admitting: Nurse Practitioner

## 2024-05-17 DIAGNOSIS — C8408 Mycosis fungoides, lymph nodes of multiple sites: Secondary | ICD-10-CM | POA: Diagnosis not present

## 2024-05-17 DIAGNOSIS — L299 Pruritus, unspecified: Secondary | ICD-10-CM

## 2024-05-17 DIAGNOSIS — S21209A Unspecified open wound of unspecified back wall of thorax without penetration into thoracic cavity, initial encounter: Secondary | ICD-10-CM | POA: Diagnosis not present

## 2024-05-17 DIAGNOSIS — C9150 Adult T-cell lymphoma/leukemia (HTLV-1-associated) not having achieved remission: Secondary | ICD-10-CM | POA: Diagnosis not present

## 2024-05-17 MED ORDER — HYDROXYZINE HCL 25 MG PO TABS: 25.0000 mg | ORAL_TABLET | Freq: Four times a day (QID) | ORAL | 0 refills | Status: DC | PRN

## 2024-05-17 NOTE — Progress Notes (Signed)
 Called patient to discuss itching. Patient unable to talk now. At wound center. Recommend re-trying hydroxyzine  25 q6h as needed for itching. Previously stopped d/t side effects. He can continue singulair , prednisone .  He questions being told to discontinue doxycyline but is unsure where this information came from. I'm unfamiliar with this and will defer back to primary team for clarification.

## 2024-05-17 NOTE — Telephone Encounter (Signed)
 Oral Oncology Patient Advocate Encounter  Patient is transitioning care from [facility].   Patient is enrolled in PAP through [facility].  When I contacted the PAP the representative stated that they would just need a new prescription faxed over from the patient's current oncologist at [facility].  I have e-faxed an updated prescription (pages 5 and 6 of PAP application, as requested by program representative) to [facility].  [facility] fax number: [phone] [facility] phone number: [phone]  [place] [phone]) Chet Burnet, CPhT  [facility], Zelda Salmon, [place] Oral Chemotherapy Patient Advocate Specialist III Phone: [phone]  Fax: [phone]

## 2024-05-18 ENCOUNTER — Inpatient Hospital Stay

## 2024-05-18 VITALS — BP 158/65 | HR 60 | Temp 97.4°F | Resp 18 | Ht 67.0 in | Wt 190.0 lb

## 2024-05-18 DIAGNOSIS — Z5111 Encounter for antineoplastic chemotherapy: Secondary | ICD-10-CM | POA: Diagnosis not present

## 2024-05-18 DIAGNOSIS — C8408 Mycosis fungoides, lymph nodes of multiple sites: Secondary | ICD-10-CM

## 2024-05-18 LAB — CMP (CANCER CENTER ONLY)
ALT: 13 U/L (ref 0–44)
AST: 20 U/L (ref 15–41)
Albumin: 3.6 g/dL (ref 3.5–5.0)
Alkaline Phosphatase: 91 U/L (ref 38–126)
Anion gap: 9 (ref 5–15)
BUN: 26 mg/dL — ABNORMAL HIGH (ref 8–23)
CO2: 26 mmol/L (ref 22–32)
Calcium: 9 mg/dL (ref 8.9–10.3)
Chloride: 103 mmol/L (ref 98–111)
Creatinine: 1.09 mg/dL (ref 0.61–1.24)
GFR, Estimated: >60 mL/min (ref >60)
Glucose, Bld: 148 mg/dL — ABNORMAL HIGH (ref 70–99)
Potassium: 3.7 mmol/L (ref 3.5–5.1)
Sodium: 138 mmol/L (ref 135–145)
Total Bilirubin: 0.5 mg/dL (ref 0.0–1.2)
Total Protein: 6.8 g/dL (ref 6.5–8.1)

## 2024-05-18 LAB — CBC WITH DIFFERENTIAL (CANCER CENTER ONLY)
Abs Immature Granulocytes: 0.14 K/uL — ABNORMAL HIGH (ref 0.00–0.07)
Basophils Absolute: 0.1 K/uL (ref 0.0–0.1)
Basophils Relative: 1 %
Eosinophils Absolute: 0.1 K/uL (ref 0.0–0.5)
Eosinophils Relative: 1 %
HCT: 38.3 % — ABNORMAL LOW (ref 39.0–52.0)
Hemoglobin: 12.5 g/dL — ABNORMAL LOW (ref 13.0–17.0)
Immature Granulocytes: 1 %
Lymphocytes Relative: 6 %
Lymphs Abs: 0.6 K/uL — ABNORMAL LOW (ref 0.7–4.0)
MCH: 30.9 pg (ref 26.0–34.0)
MCHC: 32.6 g/dL (ref 30.0–36.0)
MCV: 94.6 fL (ref 80.0–100.0)
Monocytes Absolute: 0.6 K/uL (ref 0.1–1.0)
Monocytes Relative: 6 %
Neutro Abs: 8.6 K/uL — ABNORMAL HIGH (ref 1.7–7.7)
Neutrophils Relative %: 85 %
Platelet Count: 232 K/uL (ref 150–400)
RBC: 4.05 MIL/uL — ABNORMAL LOW (ref 4.22–5.81)
RDW: 15.9 % — ABNORMAL HIGH (ref 11.5–15.5)
WBC Count: 10.2 K/uL (ref 4.0–10.5)
nRBC: 0 % (ref 0.0–0.2)

## 2024-05-18 MED ORDER — PROCHLORPERAZINE MALEATE 10 MG PO TABS
10.0000 mg | ORAL_TABLET | Freq: Once | ORAL | Status: AC
Start: 2024-05-18 — End: 2024-05-18
  Administered 2024-05-18: 10 mg via ORAL
  Filled 2024-05-18: qty 1

## 2024-05-18 MED ORDER — PRALATREXATE CHEMO INJECTION 20 MG/ML
15.0000 mg/m2 | Freq: Once | INTRAVENOUS | Status: AC
  Administered 2024-05-18: 30 mg via INTRAVENOUS
  Filled 2024-05-18: qty 1.5

## 2024-05-18 MED ORDER — SODIUM CHLORIDE 0.9 % IV SOLN
INTRAVENOUS | Status: DC
  Filled 2024-05-18: qty 250

## 2024-05-18 NOTE — Progress Notes (Signed)
 [facility] CSW Progress Note  Visual merchandiser met with patient to follow-up on emotional support.    Interventions: Provided brief mental health counseling with regard to adjusting to his illness.  Patient appeared to respond positively to life review.  He still has excessive itching.  His medical team is aware.       Follow Up Plan:  CSW will follow-up with patient by phone     Macario CHRISTELLA Au, LCSW Clinical Social Worker [facility]

## 2024-05-18 NOTE — Patient Instructions (Signed)
 [facility] - A DEPT OF [facility]  Discharge Instructions: Thank you for choosing [facility] to provide your oncology and hematology care.  If you have a lab appointment with the Cancer Center, please go directly to the Cancer Center and check in at the registration area.  Wear comfortable clothing and clothing appropriate for easy access to any Portacath or PICC line.   We strive to give you quality time with your provider. You may need to reschedule your appointment if you arrive late (15 or more minutes).  Arriving late affects you and other patients whose appointments are after yours.  Also, if you miss three or more appointments without notifying the office, you may be dismissed from the clinic at the provider's discretion.      For prescription refill requests, have your pharmacy contact our office and allow 72 hours for refills to be completed.    Today you received the following chemotherapy and/or immunotherapy agents FOLOTYN       To help prevent nausea and vomiting after your treatment, we encourage you to take your nausea medication as directed.  BELOW ARE SYMPTOMS THAT SHOULD BE REPORTED IMMEDIATELY: *FEVER GREATER THAN 100.4 F (38 C) OR HIGHER *CHILLS OR SWEATING *NAUSEA AND VOMITING THAT IS NOT CONTROLLED WITH YOUR NAUSEA MEDICATION *UNUSUAL SHORTNESS OF BREATH *UNUSUAL BRUISING OR BLEEDING *URINARY PROBLEMS (pain or burning when urinating, or frequent urination) *BOWEL PROBLEMS (unusual diarrhea, constipation, pain near the anus) TENDERNESS IN MOUTH AND THROAT WITH OR WITHOUT PRESENCE OF ULCERS (sore throat, sores in mouth, or a toothache) UNUSUAL RASH, SWELLING OR PAIN  UNUSUAL VAGINAL DISCHARGE OR ITCHING   Items with * indicate a potential emergency and should be followed up as soon as possible or go to the Emergency Department if any problems should occur.  Please show the CHEMOTHERAPY ALERT CARD or IMMUNOTHERAPY  ALERT CARD at check-in to the Emergency Department and triage nurse.  Should you have questions after your visit or need to cancel or reschedule your appointment, please contact [facility] - A DEPT OF [facility]  [phone] and follow the prompts.  Office hours are 8:00 a.m. to 4:30 p.m. Monday - Friday. Please note that voicemails left after 4:00 p.m. may not be returned until the following business day.  We are closed weekends and major holidays. You have access to a nurse at all times for urgent questions. Please call the main number to the clinic [phone] and follow the prompts.  For any non-urgent questions, you may also contact your provider using MyChart. We now offer e-Visits for anyone 75 and older to request care online for non-urgent symptoms. For details visit [email or web address].   Also download the MyChart app! Go to the app store, search MyChart, open the app, select [place], and log in with your MyChart username and password.  Pralatrexate  Injection What is this medication? PRALATREXATE  (pral a TREX ate) treats lymphoma. It works by slowing down the growth of cancer cells. This medicine may be used for other purposes; ask your health care provider or pharmacist if you have questions. COMMON BRAND NAME(S): Folotyn  What should I tell my care team before I take this medication? They need to know if you have any of these conditions: Infection, such as chickenpox, cold sores, herpes Kidney disease Liver disease Low blood cell levels (white cells, red cells, and platelets) An unusual or allergic reaction to pralatrexate , other  medications, foods, dyes, or preservatives If you or your partner are pregnant or trying to get pregnant Breast-feeding How should I use this medication? This medication is injected into a vein. It is given by your care team in a hospital or clinic setting. Talk to your care team about the use of this  medication in children. Special care may be needed. Overdosage: If you think you have taken too much of this medicine contact a poison control center or emergency room at once. NOTE: This medicine is only for you. Do not share this medicine with others. What if I miss a dose? Keep appointments for follow-up doses. It is important not to miss your dose. Call your care team if you are unable to keep an appointment. What may interact with this medication? Do not take this medication with any of the following: Live virus vaccines This medication may interact with the following: NSAIDs, medications for pain and inflammation, such as ibuprofen or naproxen Probenecid Trimethoprim; sulfamethoxazole This list may not describe all possible interactions. Give your health care provider a list of all the medicines, herbs, non-prescription drugs, or dietary supplements you use. Also tell them if you smoke, drink alcohol , or use illegal drugs. Some items may interact with your medicine. What should I watch for while using this medication? Your condition will be monitored carefully while you are receiving this medication. This medication may make you feel generally unwell. This is not uncommon as chemotherapy can affect healthy cells as well as cancer cells. Report any side effects. Continue your course of treatment even though you feel ill unless your care team tells you to stop. This medication may increase your risk of getting an infection. Call your care team for advice if you get a fever, chills, sore throat, or other symptoms of a cold or flu. Do not treat yourself. Try to avoid being around people who are sick. This medication may increase your risk to bruise or bleed. Call your care team if you notice any unusual bleeding. Talk to your care team if you or your partner wish to become pregnant or think you might be pregnant. This medication can cause serious birth defects if taken during pregnancy and for 6  months after the last dose. A negative pregnancy test is required before starting this medication. A reliable form of contraception is recommended while taking this medication and for 6 months after the last dose. Talk to your care team about effective forms of contraception. Do not father a child while taking this medication and for 3 months after the last dose. Use a condom while having sex during this time period. Do not breastfeed while taking this medication and for 1 week after the last dose. What side effects may I notice from receiving this medication? Side effects that you should report to your care team as soon as possible: Allergic reactions--skin rash, itching, hives, swelling of the face, lips, tongue, or throat Infection--fever, chills, cough, sore throat, wounds that don't heal, pain or trouble when passing urine, general feeling of discomfort or being unwell Liver injury--right upper belly pain, loss of appetite, nausea, light-colored stool, dark yellow or brown urine, yellowing skin or eyes, unusual weakness or fatigue Low red blood cell level--unusual weakness or fatigue, dizziness, headache, trouble breathing Pain, redness, or swelling with sores inside the mouth or throat Redness, blistering, peeling, or loosening of the skin, including inside the mouth Tumor lysis syndrome (TLS)--nausea, vomiting, diarrhea, decrease in the amount of urine, dark urine,  unusual weakness or fatigue, confusion, muscle pain or cramps, fast or irregular heartbeat, joint pain Unusual bruising or bleeding Side effects that usually do not require medical attention (report to your care team if they continue or are bothersome): Constipation Cough Diarrhea Fatigue Nausea Swelling of the ankles, hands, or feet Vomiting This list may not describe all possible side effects. Call your doctor for medical advice about side effects. You may report side effects to FDA at 1-800-FDA-1088. Where should I keep my  medication? This medication is given in a hospital or clinic. It will not be stored at home. NOTE: This sheet is a summary. It may not cover all possible information. If you have questions about this medicine, talk to your doctor, pharmacist, or health care provider.  2024 Elsevier/Gold Standard (2022-01-20 00:00:00)

## 2024-05-24 ENCOUNTER — Ambulatory Visit: Admitting: Internal Medicine

## 2024-05-25 ENCOUNTER — Inpatient Hospital Stay (HOSPITAL_BASED_OUTPATIENT_CLINIC_OR_DEPARTMENT_OTHER): Admitting: Internal Medicine

## 2024-05-25 ENCOUNTER — Inpatient Hospital Stay

## 2024-05-25 ENCOUNTER — Inpatient Hospital Stay: Admitting: Hospice and Palliative Medicine

## 2024-05-25 ENCOUNTER — Other Ambulatory Visit: Payer: Self-pay

## 2024-05-25 ENCOUNTER — Other Ambulatory Visit (HOSPITAL_COMMUNITY): Payer: Self-pay

## 2024-05-25 ENCOUNTER — Telehealth: Payer: Self-pay

## 2024-05-25 ENCOUNTER — Telehealth: Payer: Self-pay | Admitting: Pharmacist

## 2024-05-25 ENCOUNTER — Encounter: Payer: Self-pay | Admitting: Internal Medicine

## 2024-05-25 VITALS — BP 118/48 | HR 60 | Temp 97.7°F | Resp 18 | Ht 67.0 in | Wt 191.0 lb

## 2024-05-25 DIAGNOSIS — Z5111 Encounter for antineoplastic chemotherapy: Secondary | ICD-10-CM | POA: Diagnosis not present

## 2024-05-25 DIAGNOSIS — C8408 Mycosis fungoides, lymph nodes of multiple sites: Secondary | ICD-10-CM

## 2024-05-25 LAB — CBC WITH DIFFERENTIAL (CANCER CENTER ONLY)
Abs Immature Granulocytes: 0.15 K/uL — ABNORMAL HIGH (ref 0.00–0.07)
Basophils Absolute: 0.1 K/uL (ref 0.0–0.1)
Basophils Relative: 1 %
Eosinophils Absolute: 0.1 K/uL (ref 0.0–0.5)
Eosinophils Relative: 2 %
HCT: 37.9 % — ABNORMAL LOW (ref 39.0–52.0)
Hemoglobin: 12.4 g/dL — ABNORMAL LOW (ref 13.0–17.0)
Immature Granulocytes: 2 %
Lymphocytes Relative: 14 %
Lymphs Abs: 1 K/uL (ref 0.7–4.0)
MCH: 31.6 pg (ref 26.0–34.0)
MCHC: 32.7 g/dL (ref 30.0–36.0)
MCV: 96.7 fL (ref 80.0–100.0)
Monocytes Absolute: 0.8 K/uL (ref 0.1–1.0)
Monocytes Relative: 12 %
Neutro Abs: 4.8 K/uL (ref 1.7–7.7)
Neutrophils Relative %: 69 %
Platelet Count: 212 K/uL (ref 150–400)
RBC: 3.92 MIL/uL — ABNORMAL LOW (ref 4.22–5.81)
RDW: 17.2 % — ABNORMAL HIGH (ref 11.5–15.5)
WBC Count: 7 K/uL (ref 4.0–10.5)
nRBC: 0 % (ref 0.0–0.2)

## 2024-05-25 LAB — CMP (CANCER CENTER ONLY)
ALT: 13 U/L (ref 0–44)
AST: 20 U/L (ref 15–41)
Albumin: 3.2 g/dL — ABNORMAL LOW (ref 3.5–5.0)
Alkaline Phosphatase: 71 U/L (ref 38–126)
Anion gap: 7 (ref 5–15)
BUN: 24 mg/dL — ABNORMAL HIGH (ref 8–23)
CO2: 25 mmol/L (ref 22–32)
Calcium: 8.9 mg/dL (ref 8.9–10.3)
Chloride: 106 mmol/L (ref 98–111)
Creatinine: 0.92 mg/dL (ref 0.61–1.24)
GFR, Estimated: >60 mL/min (ref >60)
Glucose, Bld: 114 mg/dL — ABNORMAL HIGH (ref 70–99)
Potassium: 3.6 mmol/L (ref 3.5–5.1)
Sodium: 138 mmol/L (ref 135–145)
Total Bilirubin: 0.5 mg/dL (ref 0.0–1.2)
Total Protein: 6.4 g/dL — ABNORMAL LOW (ref 6.5–8.1)

## 2024-05-25 MED ORDER — MIRTAZAPINE 15 MG PO TABS: 15.0000 mg | ORAL_TABLET | Freq: Every day | ORAL | 1 refills | Status: DC

## 2024-05-25 MED ORDER — PREGABALIN 75 MG PO CAPS: 75.0000 mg | ORAL_CAPSULE | Freq: Two times a day (BID) | ORAL | 1 refills | Status: DC

## 2024-05-25 MED ORDER — SODIUM CHLORIDE 0.9 % IV SOLN
INTRAVENOUS | Status: DC
Start: 2024-05-25 — End: 2024-05-25
  Filled 2024-05-25: qty 250

## 2024-05-25 MED ORDER — APREPITANT 125 MG PO CAPS
ORAL_CAPSULE | ORAL | 0 refills | Status: DC
  Filled 2024-05-25: qty 3, 3d supply, fill #0
  Filled 2024-05-26: qty 2, 30d supply, fill #0

## 2024-05-25 MED ORDER — PRALATREXATE CHEMO INJECTION 20 MG/ML
15.0000 mg/m2 | Freq: Once | INTRAVENOUS | Status: AC
  Administered 2024-05-25: 30 mg via INTRAVENOUS
  Filled 2024-05-25: qty 1.5

## 2024-05-25 MED ORDER — PROCHLORPERAZINE MALEATE 10 MG PO TABS
10.0000 mg | ORAL_TABLET | Freq: Once | ORAL | Status: AC
  Administered 2024-05-25: 10 mg via ORAL
  Filled 2024-05-25: qty 1

## 2024-05-25 NOTE — Telephone Encounter (Signed)
 Pt notified by message

## 2024-05-25 NOTE — Telephone Encounter (Signed)
 Pts wife is asking if the pt can either eat honey or put honey on his lesions? They were told by another pt to try.

## 2024-05-25 NOTE — Telephone Encounter (Signed)
 Clinical Pharmacist Practitioner Encounter  New authorization  Received notification from [employer] that prior authorization for Aprepitant  is required.   PA submitted on CMM Key [id number] Status is pending   Oral Oncology Clinic will continue to follow.   Dez Stauffer N. Lynsi Dooner, PharmD, BCOP, CPP Hematology/Oncology Clinical Pharmacist [facility] Oral Chemotherapy Navigation Clinic [phone]  05/25/2024 4:44 PM

## 2024-05-25 NOTE — Progress Notes (Signed)
 Pt has crusty-like necrotic raised lesions on his rt hand, b/l legs, bad smell. He states some areas are drying up some. His hand lesion hurts 10/10. Itching is unbearable 10/10. Needs something for itch.  C/o sob after coughing. When he coughs it is painful in the right shoulder upper chest area.

## 2024-05-25 NOTE — Progress Notes (Signed)
 Virtual Visit via Telephone Note  I connected with Manuel Cook on 05/25/24 at  2:10 PM EDT by telephone and verified that I am speaking with the correct person using two identifiers.  Location: Patient: Home Provider: Clinic   I discussed the limitations, risks, security and privacy concerns of performing an evaluation and management service by telephone and the availability of in person appointments. I also discussed with the patient that there may be a patient responsible charge related to this service. The patient expressed understanding and agreed to proceed.   History of Present Illness: Manuel Cook is a 78 y.o. 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  Patient is referred to palliative care to address goals.   Observations/Objective: I spoke with patient and wife by phone. Both report that patient is doing reasonably well. No acute changes or concerns reported. Patient is still having necrotic sores on skin and says he is anxious and hopeful to see improvement with treatment.   Patient says he was able to complete ACP documents. He is still considering MOST form.   Assessment and Plan: stage IV T-cell cutaneous lymphoma/mycosis fungoides - On chemotherapy  ACP - completed by LCSW. Patient/wife thinking about MOST form   Follow Up Instructions: RTC 1 month   I discussed the assessment and treatment plan with the patient. The patient was provided an opportunity to ask questions and all were answered. The patient agreed with the plan and demonstrated an understanding of the instructions.   The patient was advised to call back or seek an in-person evaluation if the symptoms worsen or if the condition fails to improve as anticipated.  I provided 10 minutes of non-face-to-face time during this encounter.   Manuel JONELLE MOWER, NP

## 2024-05-25 NOTE — Patient Instructions (Signed)
 [facility] - A DEPT OF [place]  Discharge Instructions: Thank you for choosing [facility] to provide your oncology and hematology care.  If you have a lab appointment with the Cancer Center, please go directly to the Cancer Center and check in at the registration area.  Wear comfortable clothing and clothing appropriate for easy access to any Portacath or PICC line.   We strive to give you quality time with your provider. You may need to reschedule your appointment if you arrive late (15 or more minutes).  Arriving late affects you and other patients whose appointments are after yours.  Also, if you miss three or more appointments without notifying the office, you may be dismissed from the clinic at the provider's discretion.      For prescription refill requests, have your pharmacy contact our office and allow 72 hours for refills to be completed.    Today you received the following chemotherapy and/or immunotherapy agents- Folotyn       To help prevent nausea and vomiting after your treatment, we encourage you to take your nausea medication as directed.  BELOW ARE SYMPTOMS THAT SHOULD BE REPORTED IMMEDIATELY: *FEVER GREATER THAN 100.4 F (38 C) OR HIGHER *CHILLS OR SWEATING *NAUSEA AND VOMITING THAT IS NOT CONTROLLED WITH YOUR NAUSEA MEDICATION *UNUSUAL SHORTNESS OF BREATH *UNUSUAL BRUISING OR BLEEDING *URINARY PROBLEMS (pain or burning when urinating, or frequent urination) *BOWEL PROBLEMS (unusual diarrhea, constipation, pain near the anus) TENDERNESS IN MOUTH AND THROAT WITH OR WITHOUT PRESENCE OF ULCERS (sore throat, sores in mouth, or a toothache) UNUSUAL RASH, SWELLING OR PAIN  UNUSUAL VAGINAL DISCHARGE OR ITCHING   Items with * indicate a potential emergency and should be followed up as soon as possible or go to the Emergency Department if any problems should occur.  Please show the CHEMOTHERAPY ALERT CARD or IMMUNOTHERAPY  ALERT CARD at check-in to the Emergency Department and triage nurse.  Should you have questions after your visit or need to cancel or reschedule your appointment, please contact [facility] - A DEPT OF [facility]  [phone] and follow the prompts.  Office hours are 8:00 a.m. to 4:30 p.m. Monday - Friday. Please note that voicemails left after 4:00 p.m. may not be returned until the following business day.  We are closed weekends and major holidays. You have access to a nurse at all times for urgent questions. Please call the main number to the clinic [phone] and follow the prompts.  For any non-urgent questions, you may also contact your provider using MyChart. We now offer e-Visits for anyone 60 and older to request care online for non-urgent symptoms. For details visit [email or web address].   Also download the MyChart app! Go to the app store, search MyChart, open the app, select [place], and log in with your MyChart username and password.

## 2024-05-25 NOTE — Progress Notes (Signed)
 [facility] Healthcare Advance Directives Clinical Social Work  Patient presented to Advance Directives Clinic  to review and complete healthcare advance directives.  Clinical Social Worker met with patient and his wife, Candis.  The patient designated Aron Inge as their primary healthcare agent and Casten Floren, Sr. as their secondary agent.  Patient also completed healthcare living will.    Documents were notarized and copies made for patient/family. Clinical Social Worker will send documents to medical records to be scanned into patient's chart. Clinical Social Worker encouraged patient/family to contact with any additional questions or concerns.   Macario CHRISTELLA Au, LCSW Clinical Social Worker [facility]

## 2024-05-25 NOTE — Progress Notes (Signed)
 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 No convincing evidence of nodal or visceral involvement. # MAY 2025- CT chest- IMPRESSION: 1. Numerous new bilateral pulmonary nodules, measuring up to 10 mm. Imaging features are highly concerning for metastatic disease.  # Patient currently status post multiple lines of therapy -most recently noted to have  progression with new tumors despite bexarotene .    # JULY 17th, 2025- pralatrexate - 15mg /m2 for 3/4 weeks.   Mycosis fungoides, lymph nodes of multiple sites (HCC)  05/24/2012 Initial Diagnosis   Mycosis fungoides, unspecified site (HCC)   04/03/2024  Cancer Staging   Staging form: Mycosis Fungoides and Sezary Syndrome, AJCC 8th Edition - Clinical: Stage IVB (cT4, cN0, pM1, B0) - Signed by Rennie Cindy SAUNDERS, MD on 04/03/2024   04/13/2024 -  Chemotherapy   Patient is on Treatment Plan : NON-HODGKINS LYMPHOMA T-CELL Pralatrexate  q49d        HISTORY OF PRESENTING ILLNESS: Patient ambulating-independently. With wife.   Manuel Cook 78 y.o.  male pleasant patient with a longstanding history of T-cell cutaneous lymphoma/mycosis fungoides status post multiple lines of therapy currently on palliative chemotherapy- pralatrexate  and  is here for a follow up.  Pt has crusty-like necrotic raised lesions on his rt hand, b/l legs, bad smell. He states some areas are drying up some. His hand lesion hurts 10/10. Itching is unbearable 10/10. Needs something for itch.  Notes to have improvement of cough with prednisone .   Patient notes have fair Appetite. Complains of bilateral leg swelling-on diuretic. Pt states the numbness in his hands is getting worse.   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   Review of Systems  Constitutional:  Positive for malaise/fatigue and weight loss. Negative for chills, diaphoresis and fever.  HENT:  Negative for nosebleeds and sore throat.   Eyes:  Negative for double vision.  Respiratory:  Negative for cough, hemoptysis, sputum production, shortness of breath and wheezing.   Cardiovascular:  Negative for chest pain, palpitations, orthopnea and leg swelling.  Gastrointestinal:  Negative for abdominal pain, blood in stool, constipation, diarrhea, heartburn, melena, nausea and vomiting.  Genitourinary:  Negative for dysuria, frequency and urgency.  Musculoskeletal:  Negative for back pain and joint pain.  Skin:  Positive for itching and rash.  Neurological:  Negative for dizziness, tingling, focal weakness, weakness and headaches.  Endo/Heme/Allergies:  Does not  bruise/bleed easily.  Psychiatric/Behavioral:  Negative for  depression. The patient is not nervous/anxious and does not have insomnia.     MEDICAL HISTORY:  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HCC)    Pneumonia    Sleep apnea     SURGICAL HISTORY: Past Surgical History:  Procedure Laterality Date   ANTERIOR LAT LUMBAR FUSION N/A 04/08/2021   Procedure: Lumbar one-two Lumbar two-three Anterolateral lumbar interbody fusion with posterior percutaneous fixation Lumbar one to Lumbar three, removal of old hardware at Lumbar four-five;  Surgeon: Colon Shove, MD;  Location: [facility] OR;  Service: Neurosurgery;  Laterality: N/A;   BACK SURGERY     BIOPSY  07/20/2023   Procedure: BIOPSY;  Surgeon: Maryruth Ole DASEN, MD;  Location: [facility] ENDOSCOPY;  Service: Endoscopy;;   CARDIAC CATHETERIZATION     COLONOSCOPY WITH PROPOFOL  N/A 12/22/2016   Procedure: COLONOSCOPY WITH PROPOFOL ;  Surgeon: Gladis RAYMOND Mariner, MD;  Location: [facility] ENDOSCOPY;  Service: Endoscopy;  Laterality: N/A;   CORONARY ANGIOPLASTY WITH STENT PLACEMENT     ESOPHAGOGASTRODUODENOSCOPY N/A 09/12/2021   Procedure: ESOPHAGOGASTRODUODENOSCOPY (EGD);  Surgeon: Onita Elspeth Sharper, DO;  Location: [facility] ENDOSCOPY;  Service: Gastroenterology;  Laterality: N/A;   ESOPHAGOGASTRODUODENOSCOPY (EGD) WITH PROPOFOL  N/A 07/20/2023   Procedure: ESOPHAGOGASTRODUODENOSCOPY (EGD) WITH PROPOFOL ;  Surgeon: Maryruth Ole DASEN, MD;  Location: [facility] ENDOSCOPY;  Service: Endoscopy;  Laterality: N/A;   EYE SURGERY     LUMBAR FUSION  10/28/2009   L3-5 PLIF   LUMBAR PERCUTANEOUS PEDICLE SCREW 2 LEVEL N/A 04/08/2021   Procedure: LUMBAR PERCUTANEOUS PEDICLE SCREW LUMBAR ONE TO THREE;  Surgeon: Colon Shove, MD;  Location: [facility] OR;  Service: Neurosurgery;  Laterality: N/A;   Neck fusion  1995   PORTA CATH INSERTION N/A 04/06/2024   Procedure: PORTA CATH INSERTION;   Surgeon: Marea Selinda RAMAN, MD;  Location: [facility] INVASIVE CV LAB;  Service: Cardiovascular;  Laterality: N/A;   TONSILLECTOMY      SOCIAL HISTORY: Social History   Socioeconomic History   Marital status: Married    Spouse name: Not on file   Number of children: Not on file   Years of education: Not on file   Highest education level: Not on file  Occupational History   Not on file  Tobacco Use   Smoking status: Former   Smokeless tobacco: Never   Tobacco comments:    qiut age 42  Vaping Use   Vaping status: Never Used  Substance and Sexual Activity   Alcohol  use: Yes    Alcohol /week: 0.0 standard drinks of alcohol     Comment: social   Drug use: No   Sexual activity: Not on file  Other Topics Concern   Not on file  Social History Narrative   Not on file   Social Drivers of Health   Financial Resource Strain: Low Risk  (01/18/2024)   Received from [facility]   Overall Financial Resource Strain (CARDIA)    Difficulty of Paying Living Expenses: Not hard at all  Food Insecurity: No Food Insecurity (03/29/2024)   Hunger Vital Sign    Worried About Running Out of Food in the Last Year: Never true    Ran Out of Food in the Last Year: Never true  Transportation Needs: No Transportation Needs (  03/29/2024)   PRAPARE - Administrator, Civil Service (Medical): No    Lack of Transportation (Non-Medical): No  Physical Activity: Not on file  Stress: No Stress Concern Present (06/18/2022)   [employer] of Occupational Health - Occupational Stress Questionnaire    Feeling of Stress : Not at all  Social Connections: Socially Integrated (06/18/2022)   Social Connection and Isolation Panel    Frequency of Communication with Friends and Family: More than three times a week    Frequency of Social Gatherings with Friends and Family: More than three times a week    Attends Religious Services: More than 4 times per year    Active Member of [employer] or Organizations:  Yes    Attends Engineer, structural: More than 4 times per year    Marital Status: Married  Catering manager Violence: Not At Risk (03/29/2024)   Humiliation, Afraid, Rape, and Kick questionnaire    Fear of Current or Ex-Partner: No    Emotionally Abused: No    Physically Abused: No    Sexually Abused: No    FAMILY HISTORY: Family History  Problem Relation Age of Onset   Heart attack Father        Brother   Diabetes Mellitus II Brother    Prostate cancer Neg Hx     ALLERGIES:  is allergic to sulfa antibiotics.  MEDICATIONS:  Current Outpatient Medications  Medication Sig Dispense Refill   acetaminophen  (TYLENOL ) 500 MG tablet Take 1,000 mg by mouth every 6 (six) hours as needed for moderate pain or headache.     amLODipine (NORVASC) 5 MG tablet Take 5 mg by mouth daily.     atenolol  (TENORMIN ) 25 MG tablet Take by mouth daily.     atorvastatin  (LIPITOR) 20 MG tablet Take 20 mg by mouth daily.     augmented betamethasone  dipropionate (DIPROLENE -AF) 0.05 % ointment Apply topically 2 (two) times daily.     benzonatate  (TESSALON ) 100 MG capsule Take 2 capsules (200 mg total) by mouth 3 (three) times daily as needed for cough. 90 capsule 1   bexarotene  (TARGRETIN ) 75 MG CAPS capsule Take 3 capsules (225 mg total) by mouth daily with breakfast. Take with food. (Patient taking differently: Take 225 mg by mouth daily with breakfast. Take with food.HE TAKES 75 MG TID) 90 capsule 2   clobetasol cream (TEMOVATE) 0.05 % Apply 1 application topically 2 (two) times daily.     folic acid  (FOLVITE ) 1 MG tablet Take 1 tablet (1 mg total) by mouth daily. 90 tablet 6   furosemide  (LASIX ) 20 MG tablet Take 2 tablets (40 mg total) by mouth daily for 7 days. (Patient taking differently: Take 40 mg by mouth daily. PRN) 7 tablet 0   HYDROcodone-acetaminophen  (NORCO/VICODIN) 5-325 MG tablet Take 1 tablet by mouth every 6 (six) hours as needed for moderate pain (pain score 4-6).     hydrOXYzine   (ATARAX ) 25 MG tablet Take 1 tablet (25 mg total) by mouth every 6 (six) hours as needed for itching (may cause drowsiness). 120 tablet 0   lidocaine -prilocaine  (EMLA ) cream Apply 1 Application topically as needed. Apply to port and cover with saran wrap 1-2 hours prior to port access 30 g 3   mirtazapine  (REMERON ) 15 MG tablet Take 1 tablet (15 mg total) by mouth at bedtime. 30 tablet 1   montelukast  (SINGULAIR ) 10 MG tablet Take 1 tablet (10 mg total) by mouth at bedtime. 30 tablet 3   ondansetron  (ZOFRAN )  8 MG tablet Take 1 tablet (8 mg total) by mouth every 8 (eight) hours as needed for nausea or vomiting. 60 tablet 1   pantoprazole  (PROTONIX ) 40 MG tablet Take 40 mg by mouth daily as needed (acid reflux).     potassium chloride  SA (KLOR-CON  M) 20 MEQ tablet Take 1 tablet (20 mEq total) by mouth daily for 7 days. (Patient taking differently: Take 20 mEq by mouth daily. PRN IF USING THE LASIX ) 5 tablet 0   predniSONE  (DELTASONE ) 20 MG tablet Once a day with food- 30 tablet 0   pregabalin  (LYRICA ) 75 MG capsule Take 1 capsule (75 mg total) by mouth 2 (two) times daily. 60 capsule 1   prochlorperazine  (COMPAZINE ) 10 MG tablet Take 1 tablet (10 mg total) by mouth every 6 (six) hours as needed for nausea or vomiting. 60 tablet 1   leucovorin  (WELLCOVORIN ) 25 MG tablet Take 1 tablet (25 mg total) by mouth 3 (three) times daily. Take Three times a day- Take for 2 consecutive days [a total of 6 doses] beginning 24 hours after each dose of Pralatrexate  chemotherapy. 60 tablet 3   No current facility-administered medications for this visit.   Facility-Administered Medications Ordered in Other Visits  Medication Dose Route Frequency Provider Last Rate Last Admin   0.9 %  sodium chloride  infusion   Intravenous Continuous Cordie Buening R, MD   Stopped at 05/25/24 1102    PHYSICAL EXAMINATION:   Vitals:   05/25/24 0853  BP: (!) 118/48  Pulse: 60  Resp: 18  Temp: 97.7 F (36.5 C)  SpO2: 99%    Filed Weights   05/25/24 0853  Weight: 191 lb (86.6 kg)    Physical Exam Vitals and nursing note reviewed.  HENT:     Head: Normocephalic and atraumatic.     Mouth/Throat:     Pharynx: Oropharynx is clear.  Eyes:     Extraocular Movements: Extraocular movements intact.     Pupils: Pupils are equal, round, and reactive to light.  Cardiovascular:     Rate and Rhythm: Normal rate and regular rhythm.  Pulmonary:     Comments: Decreased breath sounds bilaterally.  Abdominal:     Palpations: Abdomen is soft.  Musculoskeletal:        General: Normal range of motion.     Cervical back: Normal range of motion.  Skin:    General: Skin is warm.  Neurological:     General: No focal deficit present.     Mental Status: He is alert and oriented to person, place, and time.  Psychiatric:        Behavior: Behavior normal.        Judgment: Judgment normal.              AUG 14th, 2025- BEFORE CYCLE # 2             PRIOR TO CYCLE #-2 days #15-                LABORATORY DATA:  I have reviewed the data as listed Lab Results  Component Value Date   WBC 7.0 05/25/2024   HGB 12.4 (L) 05/25/2024   HCT 37.9 (L) 05/25/2024   MCV 96.7 05/25/2024   PLT 212 05/25/2024   Recent Labs    05/11/24 0838 05/18/24 1252 05/25/24 0854  NA 139 138 138  K 3.9 3.7 3.6  CL 105 103 106  CO2 27 26 25   GLUCOSE 129* 148* 114*  BUN 14 26*  24*  CREATININE 0.83 1.09 0.92  CALCIUM  9.3 9.0 8.9  GFRNONAA >60 >60 >60  PROT 7.0 6.8 6.4*  ALBUMIN 3.5 3.6 3.2*  AST 17 20 20   ALT 14 13 13   ALKPHOS 79 91 71  BILITOT 0.9 0.5 0.5    RADIOGRAPHIC STUDIES: I have personally reviewed the radiological images as listed and agreed with the findings in the report. No results found.    Mycosis fungoides, lymph nodes of multiple sites (HCC) # since 2012-mycosis fungoides -cutaneous T-cell lymphoma-PET scan- JULY 2025-multiple skin lesions and increasing multiple lung  nodules concerning for metastatic disease.  Discussed with Dr.Aleskerov- NO lung biopsy.  STAGE IV- JULY 14th- 2025- pralatrexate  [15mg /m2 for 3/4 weeks].  # Proceed with cycle number 2 day 15 chemotherapy. Labs-CBC/chemistries were reviewed with the patient.  Continue leucovorin  with each pralatrexate  - 25 mg TID; Daily folic acid  supplemental B12.  05/12/2024- ADDED  bexarotene  225 mg [75 mg 3 pills day along with Pralatrexate ] chemo  # Skin itching- recommend prednisone   20 mg/day; Clairtin daily- singulair - add lyrica   75 mg BID; and add remeron  15 mg mg at bedtime- continue atarax / prn benadryl  prn- consider adding emend  out patient. Recommend bring pill bottles.   # Iatrogenic central hypothyroidism:  [sec to bexarotene .]-On thyroid  supplementation.  Monitor closely off bexarotene  since their thyroid  issues resolve  AUG 2025- TSH- WNL; check lipid pane.   # Prostate - pet scan with increased FDG avidity. PSA normal recently. he is setting up to see urologist locally in early July  # ACP: s/p pallaitive referral.   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   # Constipation- add stool softener/laxative as needed.  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   Weekly chemo- q 2 week- MD; 2 cycles-   PS # DISPOSITION: # add lipid panel to next labs/week # chemo today-  # Weekly lab- cbc/cmp # as per IS-Dr.B     Above plan of care was discussed with patient/family in detail.  My contact information was given to the patient/family.      Cindy JONELLE Joe, MD 05/25/2024 11:19 AM

## 2024-05-25 NOTE — Patient Instructions (Signed)

## 2024-05-25 NOTE — Assessment & Plan Note (Addendum)
#   since 2012-mycosis fungoides -cutaneous T-cell lymphoma-PET scan- JULY 2025-multiple skin lesions and increasing multiple lung nodules concerning for metastatic disease.  Discussed with Dr.Aleskerov- NO lung biopsy.  STAGE IV- JULY 14th- 2025- pralatrexate  [15mg /m2 for 3/4 weeks].  # Proceed with cycle number 2 day 15 chemotherapy. Labs-CBC/chemistries were reviewed with the patient.  Continue leucovorin  with each pralatrexate  - 25 mg TID; Daily folic acid  supplemental B12.  05/12/2024- ADDED  bexarotene  225 mg [75 mg 3 pills day along with Pralatrexate ] chemo  # Skin itching- recommend prednisone   20 mg/day; Clairtin daily- singulair - add lyrica   75 mg BID; and add remeron  15 mg mg at bedtime- continue atarax / prn benadryl  prn- consider adding emend  out patient. Recommend bring pill bottles.   # Iatrogenic central hypothyroidism:  [sec to bexarotene .]-On thyroid  supplementation.  Monitor closely off bexarotene  since their thyroid  issues resolve  AUG 2025- TSH- WNL; check lipid pane.   # Prostate - pet scan with increased FDG avidity. PSA normal recently. he is setting up to see urologist locally in early July  # ACP: s/p pallaitive referral.   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   # Constipation- add stool softener/laxative as needed.  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   Weekly chemo- q 2 week- MD; 2 cycles-   PS # DISPOSITION: # add lipid panel to next labs/week # chemo today-  # Weekly lab- cbc/cmp # as per IS-Dr.B

## 2024-05-26 ENCOUNTER — Other Ambulatory Visit: Payer: Self-pay

## 2024-05-26 MED ORDER — APREPITANT 80 MG PO CAPS
ORAL_CAPSULE | ORAL | 0 refills | Status: DC
  Filled 2024-05-26: qty 4, 30d supply, fill #0

## 2024-05-26 NOTE — Telephone Encounter (Signed)
 Clinical Pharmacist Practitioner Encounter   New authorization  Prior Authorization for Aprepitant  has been approved.     PA# [id number] Effective dates: 05/25/24 through 09/27/24    Renaee GEANNIE Ditch, PharmD, BCOP, CPP Hematology/Oncology Clinical Pharmacist [facility] [phone]  05/26/2024 11:57 AM

## 2024-05-31 ENCOUNTER — Telehealth: Payer: Self-pay | Admitting: *Deleted

## 2024-05-31 ENCOUNTER — Encounter: Attending: Physician Assistant | Admitting: Physician Assistant

## 2024-05-31 ENCOUNTER — Encounter: Admitting: Hospice and Palliative Medicine

## 2024-05-31 DIAGNOSIS — C8408 Mycosis fungoides, lymph nodes of multiple sites: Secondary | ICD-10-CM | POA: Diagnosis not present

## 2024-05-31 DIAGNOSIS — L97823 Non-pressure chronic ulcer of other part of left lower leg with necrosis of muscle: Secondary | ICD-10-CM | POA: Diagnosis not present

## 2024-05-31 DIAGNOSIS — C9150 Adult T-cell lymphoma/leukemia (HTLV-1-associated) not having achieved remission: Secondary | ICD-10-CM | POA: Insufficient documentation

## 2024-05-31 DIAGNOSIS — S21209A Unspecified open wound of unspecified back wall of thorax without penetration into thoracic cavity, initial encounter: Secondary | ICD-10-CM | POA: Diagnosis not present

## 2024-05-31 DIAGNOSIS — L97812 Non-pressure chronic ulcer of other part of right lower leg with fat layer exposed: Secondary | ICD-10-CM | POA: Diagnosis not present

## 2024-05-31 DIAGNOSIS — I2510 Atherosclerotic heart disease of native coronary artery without angina pectoris: Secondary | ICD-10-CM | POA: Insufficient documentation

## 2024-05-31 DIAGNOSIS — I10 Essential (primary) hypertension: Secondary | ICD-10-CM | POA: Diagnosis not present

## 2024-05-31 NOTE — Telephone Encounter (Signed)
 Spoke with patient. He would like to change his lab apt tomorrow to 115 and see Josh tomorrow in person at 130pm. Patient stated that his hydrocodone was prescribed by [facility] in the past. This is an old prescription he has left over. The pain is related to his current wounds. He feels that the hydrocodone is not strong enough to alleviate pain. He follows up with wound care today as scheduled. Msg sent to scheduling to arrange for apts. Pt accepted apts at that time.

## 2024-05-31 NOTE — Telephone Encounter (Signed)
 The patient called saying that for the last 2 days with very bad pain of the arm and the hands he has hydrocodone and he has been using that and it is not helping at all.  The patient does not come back in til  September 4 and it is just for labs.  He would like to have somebody to see him or give him some different things to help with the pain

## 2024-06-01 ENCOUNTER — Inpatient Hospital Stay: Attending: Internal Medicine

## 2024-06-01 ENCOUNTER — Encounter: Payer: Self-pay | Admitting: Hospice and Palliative Medicine

## 2024-06-01 ENCOUNTER — Inpatient Hospital Stay (HOSPITAL_BASED_OUTPATIENT_CLINIC_OR_DEPARTMENT_OTHER): Admitting: Hospice and Palliative Medicine

## 2024-06-01 VITALS — BP 140/54 | HR 60 | Temp 97.8°F | Resp 16 | Wt 194.0 lb

## 2024-06-01 DIAGNOSIS — Z5111 Encounter for antineoplastic chemotherapy: Secondary | ICD-10-CM | POA: Diagnosis not present

## 2024-06-01 DIAGNOSIS — C8408 Mycosis fungoides, lymph nodes of multiple sites: Secondary | ICD-10-CM | POA: Diagnosis not present

## 2024-06-01 DIAGNOSIS — Z515 Encounter for palliative care: Secondary | ICD-10-CM | POA: Diagnosis not present

## 2024-06-01 DIAGNOSIS — Z79899 Other long term (current) drug therapy: Secondary | ICD-10-CM | POA: Diagnosis not present

## 2024-06-01 LAB — CMP (CANCER CENTER ONLY)
ALT: 13 U/L (ref 0–44)
AST: 19 U/L (ref 15–41)
Albumin: 3.3 g/dL — ABNORMAL LOW (ref 3.5–5.0)
Alkaline Phosphatase: 67 U/L (ref 38–126)
Anion gap: 11 (ref 5–15)
BUN: 21 mg/dL (ref 8–23)
CO2: 22 mmol/L (ref 22–32)
Calcium: 8.7 mg/dL — ABNORMAL LOW (ref 8.9–10.3)
Chloride: 101 mmol/L (ref 98–111)
Creatinine: 1 mg/dL (ref 0.61–1.24)
GFR, Estimated: >60 mL/min (ref >60)
Glucose, Bld: 202 mg/dL — ABNORMAL HIGH (ref 70–99)
Potassium: 3.9 mmol/L (ref 3.5–5.1)
Sodium: 134 mmol/L — ABNORMAL LOW (ref 135–145)
Total Bilirubin: 0.5 mg/dL (ref 0.0–1.2)
Total Protein: 6.3 g/dL — ABNORMAL LOW (ref 6.5–8.1)

## 2024-06-01 LAB — CBC WITH DIFFERENTIAL (CANCER CENTER ONLY)
Abs Immature Granulocytes: 0.12 K/uL — ABNORMAL HIGH (ref 0.00–0.07)
Basophils Absolute: 0 K/uL (ref 0.0–0.1)
Basophils Relative: 0 %
Eosinophils Absolute: 0 K/uL (ref 0.0–0.5)
Eosinophils Relative: 0 %
HCT: 37.7 % — ABNORMAL LOW (ref 39.0–52.0)
Hemoglobin: 12.3 g/dL — ABNORMAL LOW (ref 13.0–17.0)
Immature Granulocytes: 1 %
Lymphocytes Relative: 5 %
Lymphs Abs: 0.4 K/uL — ABNORMAL LOW (ref 0.7–4.0)
MCH: 31.5 pg (ref 26.0–34.0)
MCHC: 32.6 g/dL (ref 30.0–36.0)
MCV: 96.7 fL (ref 80.0–100.0)
Monocytes Absolute: 0.5 K/uL (ref 0.1–1.0)
Monocytes Relative: 6 %
Neutro Abs: 8.1 K/uL — ABNORMAL HIGH (ref 1.7–7.7)
Neutrophils Relative %: 88 %
Platelet Count: 193 K/uL (ref 150–400)
RBC: 3.9 MIL/uL — ABNORMAL LOW (ref 4.22–5.81)
RDW: 17.7 % — ABNORMAL HIGH (ref 11.5–15.5)
WBC Count: 9.2 K/uL (ref 4.0–10.5)
nRBC: 0 % (ref 0.0–0.2)

## 2024-06-01 MED ORDER — OXYCODONE HCL 10 MG PO TABS: 5.0000 mg | ORAL_TABLET | ORAL | 0 refills | Status: DC | PRN

## 2024-06-01 NOTE — Progress Notes (Signed)
 Palliative Medicine [facility] at [facility] [phone] [phone]   Name: Manuel Cook Date: 06/01/2024 MRN: [id number]  DOB: [birth year]  Patient Care Team: Manuel Manna, MD as PCP - General (Internal Medicine) Manuel Cindy JONELLE, MD as Consulting Physician (Oncology)    REASON FOR CONSULTATION: Manuel Cook is a 78 y.o. 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  Patient is referred to palliative care to address goals.   SOCIAL HISTORY:     reports that he has quit smoking. He has never used smokeless tobacco. He reports current alcohol  use. He reports that he does not use drugs.  Patient is married lives at home with his wife.  He has 3 sons who live nearby.  Patient retired as an Personnel officer.  ADVANCE DIRECTIVES:    CODE STATUS:   PAST MEDICAL HISTORY: 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HCC)    Pneumonia    Sleep apnea     PAST SURGICAL HISTORY:  Past Surgical History:  Procedure Laterality Date   ANTERIOR LAT LUMBAR FUSION N/A 04/08/2021   Procedure: Lumbar one-two Lumbar two-three Anterolateral lumbar interbody fusion with posterior percutaneous fixation Lumbar one to Lumbar three, removal of old hardware at Lumbar four-five;  Surgeon: Manuel Shove, MD;  Location: [facility] OR;  Service: Neurosurgery;  Laterality: N/A;   BACK SURGERY     BIOPSY  07/20/2023   Procedure: BIOPSY;  Surgeon: Manuel Ole DASEN, MD;  Location: [facility] ENDOSCOPY;  Service: Endoscopy;;   CARDIAC CATHETERIZATION     COLONOSCOPY WITH PROPOFOL  N/A 12/22/2016   Procedure: COLONOSCOPY WITH PROPOFOL ;  Surgeon: Manuel RAYMOND Mariner, MD;  Location: [facility] ENDOSCOPY;  Service: Endoscopy;   Laterality: N/A;   CORONARY ANGIOPLASTY WITH STENT PLACEMENT     ESOPHAGOGASTRODUODENOSCOPY N/A 09/12/2021   Procedure: ESOPHAGOGASTRODUODENOSCOPY (EGD);  Surgeon: Manuel Elspeth Sharper, DO;  Location: [facility] ENDOSCOPY;  Service: Gastroenterology;  Laterality: N/A;   ESOPHAGOGASTRODUODENOSCOPY (EGD) WITH PROPOFOL  N/A 07/20/2023   Procedure: ESOPHAGOGASTRODUODENOSCOPY (EGD) WITH PROPOFOL ;  Surgeon: Manuel Ole DASEN, MD;  Location: [facility] ENDOSCOPY;  Service: Endoscopy;  Laterality: N/A;   EYE SURGERY     LUMBAR FUSION  10/28/2009   L3-5 PLIF   LUMBAR PERCUTANEOUS PEDICLE SCREW 2 LEVEL N/A 04/08/2021   Procedure: LUMBAR PERCUTANEOUS PEDICLE SCREW LUMBAR ONE TO THREE;  Surgeon: Manuel Shove, MD;  Location: [facility] OR;  Service: Neurosurgery;  Laterality: N/A;   Neck fusion  1995   PORTA CATH INSERTION N/A 04/06/2024   Procedure: PORTA CATH INSERTION;  Surgeon: Manuel Selinda RAMAN, MD;  Location: [facility] INVASIVE CV LAB;  Service: Cardiovascular;  Laterality: N/A;   TONSILLECTOMY      HEMATOLOGY/ONCOLOGY HISTORY:  Oncology History Overview Note  Diagnosis: Mycosis fungoides with minimal CD30+ (less than 1%) on 06/2022 and 09/2023 biopsy, CD5+ in 50% Date of Diagnosis: 2012 Stage: T1bN0M0B0 (stage 1b) at diagnosis 07/20/22 and 10/14/23- T3N0M0B0 (stage IIB) but in 2025 he had multiple tumors coming up so disease activity had clearly changed but PET scan negative for extracutaneous disease  Regimen:  Steroid creams since 2013 [facility] phototherapy started 2014-09/2014 Methotrexate 20mg /week 2019-2020 and then not see by derm for several years until10/2023 Radiation: 2400  cGy to his right lower arm lesions and left arm lesion. 08/2022 09/2023 - progressive tumors - biopsy of skin cd30-, CD5+, CD4-/CD8- - no large cell transformation  10/2023-01/28/2024 - gemcitabine  x 4 cycles --> The tumors resolved with gemcitabine  but patch/plaque disease on his skin elsewhere progressed 03/03/24 - bexarotene  225mg /day   # since  2012- Cutaneous T-cell lymphoma-# FEB 2025- PET [facility]-  Extensive cutaneous/subcutaneous hypermetabolic lesions in the bilateral upper and lower extremities, consistent with history of cutaneous T-cell lymphoma. No convincing evidence of nodal or visceral involvement. # MAY 2025- CT chest- IMPRESSION: 1. Numerous new bilateral pulmonary nodules, measuring up to 10 mm. Imaging features are highly concerning for metastatic disease.  # Patient currently status post multiple lines of therapy -most recently noted to have  progression with new tumors despite bexarotene .    # JULY 17th, 2025- pralatrexate - 15mg /m2 for 3/4 weeks.   Mycosis fungoides, lymph nodes of multiple sites (HCC)  05/24/2012 Initial Diagnosis   Mycosis fungoides, unspecified site (HCC)   04/03/2024 Cancer Staging   Staging form: Mycosis Fungoides and Sezary Syndrome, AJCC 8th Edition - Clinical: Stage IVB (cT4, cN0, pM1, B0) - Signed by Manuel Cindy SAUNDERS, MD on 04/03/2024   04/13/2024 -  Chemotherapy   Patient is on Treatment Plan : NON-HODGKINS LYMPHOMA T-CELL Pralatrexate  [id number]       ALLERGIES:  is allergic to sulfa antibiotics.  MEDICATIONS:  Current Outpatient Medications  Medication Sig Dispense Refill   acetaminophen  (TYLENOL ) 500 MG tablet Take 1,000 mg by mouth every 6 (six) hours as needed for moderate pain or headache.     amLODipine (NORVASC) 5 MG tablet Take 5 mg by mouth daily.     aprepitant  (EMEND ) 125 MG capsule Take 125 mg capsule by mouth on day 1. Then take one 80 mg capsule daily on days 2 and 3. Repeat every 14 days. 2 capsule 0   aprepitant  (EMEND ) 80 MG capsule Take 125 mg capsule by mouth on day 1. Then take one 80 mg capsule daily on days 2 and 3. Repeat every 14 days. 4 capsule 0   atenolol  (TENORMIN ) 25 MG tablet Take by mouth daily.     atorvastatin  (LIPITOR) 20 MG tablet Take 20 mg by mouth daily.     augmented betamethasone  dipropionate (DIPROLENE -AF) 0.05 % ointment Apply topically 2 (two) times  daily.     benzonatate  (TESSALON ) 100 MG capsule Take 2 capsules (200 mg total) by mouth 3 (three) times daily as needed for cough. 90 capsule 1   bexarotene  (TARGRETIN ) 75 MG CAPS capsule Take 3 capsules (225 mg total) by mouth daily with breakfast. Take with food. (Patient taking differently: Take 225 mg by mouth daily with breakfast. Take with food.HE TAKES 75 MG TID) 90 capsule 2   clobetasol cream (TEMOVATE) 0.05 % Apply 1 application topically 2 (two) times daily.     doxycycline (VIBRAMYCIN) 100 MG capsule Take 100 mg by mouth 2 (two) times daily.     folic acid  (FOLVITE ) 1 MG tablet Take 1 tablet (1 mg total) by mouth daily. 90 tablet 6   furosemide  (LASIX ) 20 MG tablet Take 2 tablets (40 mg total) by mouth daily for 7 days. (Patient taking differently: Take 40 mg by mouth daily. PRN) 7 tablet 0   HYDROcodone-acetaminophen  (NORCO/VICODIN) 5-325 MG tablet Take 1 tablet by mouth every 6 (six) hours as needed for moderate pain (pain score 4-6).     hydrOXYzine  (ATARAX ) 25 MG tablet Take 1 tablet (25 mg  total) by mouth every 6 (six) hours as needed for itching (may cause drowsiness). 120 tablet 0   leucovorin  (WELLCOVORIN ) 25 MG tablet Take 1 tablet (25 mg total) by mouth 3 (three) times daily. Take Three times a day- Take for 2 consecutive days [a total of 6 doses] beginning 24 hours after each dose of Pralatrexate  chemotherapy. 60 tablet 3   lidocaine -prilocaine  (EMLA ) cream Apply 1 Application topically as needed. Apply to port and cover with saran wrap 1-2 hours prior to port access 30 g 3   mirtazapine  (REMERON ) 15 MG tablet Take 1 tablet (15 mg total) by mouth at bedtime. 30 tablet 1   montelukast  (SINGULAIR ) 10 MG tablet Take 1 tablet (10 mg total) by mouth at bedtime. 30 tablet 3   ondansetron  (ZOFRAN ) 8 MG tablet Take 1 tablet (8 mg total) by mouth every 8 (eight) hours as needed for nausea or vomiting. 60 tablet 1   pantoprazole  (PROTONIX ) 40 MG tablet Take 40 mg by mouth daily as needed  (acid reflux).     potassium chloride  SA (KLOR-CON  M) 20 MEQ tablet Take 1 tablet (20 mEq total) by mouth daily for 7 days. (Patient taking differently: Take 20 mEq by mouth daily. PRN IF USING THE LASIX ) 5 tablet 0   predniSONE  (DELTASONE ) 20 MG tablet Once a day with food- 30 tablet 0   pregabalin  (LYRICA ) 75 MG capsule Take 1 capsule (75 mg total) by mouth 2 (two) times daily. 60 capsule 1   prochlorperazine  (COMPAZINE ) 10 MG tablet Take 1 tablet (10 mg total) by mouth every 6 (six) hours as needed for nausea or vomiting. 60 tablet 1   No current facility-administered medications for this visit.    VITAL SIGNS: BP (!) 140/54 (BP Location: Left Arm, Patient Position: Sitting, Cuff Size: Normal)   Pulse 60   Temp 97.8 F (36.6 C) (Tympanic)   Resp 16   Wt 194 lb (88 kg)   SpO2 99%   BMI 30.38 kg/m  Filed Weights   06/01/24 1329  Weight: 194 lb (88 kg)    Estimated body mass index is 30.38 kg/m as calculated from the following:   Height as of 05/25/24: 5' 7 (1.702 m).   Weight as of this encounter: 194 lb (88 kg).  LABS: CBC:    Component Value Date/Time   WBC 7.0 05/25/2024 0854   WBC 9.3 10/28/2023 1912   HGB 12.4 (L) 05/25/2024 0854   HCT 37.9 (L) 05/25/2024 0854   PLT 212 05/25/2024 0854   MCV 96.7 05/25/2024 0854   NEUTROABS 4.8 05/25/2024 0854   LYMPHSABS 1.0 05/25/2024 0854   MONOABS 0.8 05/25/2024 0854   EOSABS 0.1 05/25/2024 0854   BASOSABS 0.1 05/25/2024 0854   Comprehensive Metabolic Panel:    Component Value Date/Time   NA 138 05/25/2024 0854   K 3.6 05/25/2024 0854   CL 106 05/25/2024 0854   CO2 25 05/25/2024 0854   BUN 24 (H) 05/25/2024 0854   CREATININE 0.92 05/25/2024 0854   GLUCOSE 114 (H) 05/25/2024 0854   CALCIUM  8.9 05/25/2024 0854   AST 20 05/25/2024 0854   ALT 13 05/25/2024 0854   ALKPHOS 71 05/25/2024 0854   BILITOT 0.5 05/25/2024 0854   PROT 6.4 (L) 05/25/2024 0854   ALBUMIN 3.2 (L) 05/25/2024 0854    RADIOGRAPHIC STUDIES: No  results found.   PERFORMANCE STATUS (ECOG) : 1 - Symptomatic but completely ambulatory  Review of Systems Unless otherwise noted, a complete review of systems  is negative.  Physical Exam General: NAD Pulmonary: Unlabored Abdomen: soft, nontender, + bowel sounds GU: no suprapubic tenderness Extremities: no edema, no joint deformities Skin: Rash in various stages of healing across body Neurological: Weakness but otherwise nonfocal       IMPRESSION: Follow-up visit.  Patient remains on chemotherapy.  No significant improvement with subcutaneous lesions.  Patient is also followed by wound center.  He reports overall poorly controlled pain and pruritus.  He is taking Norco 1 to 2 tablets every 4-6 hours with little relief.  He says that the pain is keeping him awake at night.  Pain is generalized throughout the skin at site of multiple lesions.  Patient says he took one of his wife's oxycodone  with better relief.  States he tolerated well.  Patient is also on pregabalin , montelukast , prednisone , hydroxyzine , and as needed diphenhydramine .  Discussed use of cool compresses for itching.  PLAN: - Continue current scope of treatment - Rotate from Norco to oxycodone  5 to 10 mg every 4 hours as needed #60 -PDMP reviewed -Daily bowel regimen - RTC in next week   Patient expressed understanding and was in agreement with this plan. He also understands that He can call the clinic at any time with any questions, concerns, or complaints.     Time Total: 20 minutes  Visit consisted of counseling and education dealing with the complex and emotionally intense issues of symptom management and palliative care in the setting of serious and potentially life-threatening illness.Greater than 50%  of this time was spent counseling and coordinating care related to the above assessment and plan.  Signed by: Fonda Mower, PhD, NP-C

## 2024-06-05 ENCOUNTER — Telehealth: Payer: Self-pay | Admitting: *Deleted

## 2024-06-05 ENCOUNTER — Other Ambulatory Visit: Payer: Self-pay

## 2024-06-05 ENCOUNTER — Inpatient Hospital Stay: Admitting: Hospice and Palliative Medicine

## 2024-06-05 DIAGNOSIS — Z515 Encounter for palliative care: Secondary | ICD-10-CM | POA: Diagnosis not present

## 2024-06-05 DIAGNOSIS — G893 Neoplasm related pain (acute) (chronic): Secondary | ICD-10-CM | POA: Diagnosis not present

## 2024-06-05 DIAGNOSIS — C8408 Mycosis fungoides, lymph nodes of multiple sites: Secondary | ICD-10-CM | POA: Diagnosis not present

## 2024-06-05 LAB — COMP PANEL: LEUKEMIA/LYMPHOMA

## 2024-06-05 MED ORDER — XTAMPZA ER 9 MG PO C12A: 1.0000 | EXTENDED_RELEASE_CAPSULE | Freq: Two times a day (BID) | ORAL | 0 refills | Status: DC

## 2024-06-05 MED ORDER — NALOXONE HCL 4 MG/0.1ML NA LIQD: NASAL | 0 refills | Status: DC

## 2024-06-05 NOTE — Telephone Encounter (Signed)
 Elizabeth from [facility] RN states that the patient is on 10 mg of oxycodone  he has general back pain arms pain in the right thumb wound and it is not helping him at all and his blood pressure today was 168/80.  She wanted to see if there is anything else to help this man to more comfort and not be in pain all the time

## 2024-06-05 NOTE — Telephone Encounter (Signed)
 MD will let me know if he wants to proceed with medication, no further action needed at this time.

## 2024-06-05 NOTE — Progress Notes (Signed)
 Virtual Visit via Telephone Note  I connected with Manuel Cook on 06/05/24 at  1:40 PM EDT by telephone and verified that I am speaking with the correct person using two identifiers.  Location: Patient: Home Provider: Clinic   I discussed the limitations, risks, security and privacy concerns of performing an evaluation and management service by telephone and the availability of in person appointments. I also discussed with the patient that there may be a patient responsible charge related to this service. The patient expressed understanding and agreed to proceed.   History of Present Illness: Manuel Cook is a 78 y.o. 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  Patient is referred to palliative care to address goals.   Observations/Objective: I spoke with patient by phone.  He says that the oxycodone  has helped his pain but is short-lived.  He says he is waking up at night and in the morning with pain.  Pain is better controlled when he is able to take the oxycodone  every 4 hours.  He is trying to stay ahead of the pain.  He denies any adverse effects from pain medications.  Discussed starting patient on a long-acting opioid with hopes that this provides longer acting relief around-the-clock.  Assessment and Plan: stage IV T-cell cutaneous lymphoma/mycosis fungoides - On chemotherapy  Neoplasm related pain -will start patient on Xtampza  ER 9 mg every 12 hours #30.  Continue oxycodone  IR as needed for breakthrough pain.  Continue Lyrica .  Daily bowel regimen.  Naloxone .  PDMP reviewed.  Follow Up Instructions: RTC later this week   I discussed the assessment and treatment plan with the patient. The patient was provided an opportunity to ask questions and all were answered. The patient agreed with the plan and demonstrated an understanding of the instructions.   The patient was  advised to call back or seek an in-person evaluation if the symptoms worsen or if the condition fails to improve as anticipated.  I provided 10 minutes of non-face-to-face time during this encounter.   FONDA JONELLE MOWER, NP

## 2024-06-05 NOTE — Telephone Encounter (Signed)
 I spoke with patient and adjusted pain regimen.

## 2024-06-07 ENCOUNTER — Encounter: Admitting: Physician Assistant

## 2024-06-07 DIAGNOSIS — C9150 Adult T-cell lymphoma/leukemia (HTLV-1-associated) not having achieved remission: Secondary | ICD-10-CM | POA: Diagnosis not present

## 2024-06-07 DIAGNOSIS — S51001A Unspecified open wound of right elbow, initial encounter: Secondary | ICD-10-CM | POA: Diagnosis not present

## 2024-06-07 DIAGNOSIS — C8408 Mycosis fungoides, lymph nodes of multiple sites: Secondary | ICD-10-CM | POA: Diagnosis not present

## 2024-06-07 DIAGNOSIS — S21209A Unspecified open wound of unspecified back wall of thorax without penetration into thoracic cavity, initial encounter: Secondary | ICD-10-CM | POA: Diagnosis not present

## 2024-06-08 ENCOUNTER — Encounter: Payer: Self-pay | Admitting: Internal Medicine

## 2024-06-08 ENCOUNTER — Inpatient Hospital Stay

## 2024-06-08 ENCOUNTER — Ambulatory Visit (HOSPITAL_BASED_OUTPATIENT_CLINIC_OR_DEPARTMENT_OTHER): Admitting: Hospice and Palliative Medicine

## 2024-06-08 ENCOUNTER — Inpatient Hospital Stay (HOSPITAL_BASED_OUTPATIENT_CLINIC_OR_DEPARTMENT_OTHER): Admitting: Internal Medicine

## 2024-06-08 VITALS — BP 132/64 | HR 64 | Temp 99.2°F | Resp 16 | Ht 67.0 in | Wt 196.3 lb

## 2024-06-08 DIAGNOSIS — Z125 Encounter for screening for malignant neoplasm of prostate: Principal | ICD-10-CM

## 2024-06-08 DIAGNOSIS — Z79899 Other long term (current) drug therapy: Principal | ICD-10-CM

## 2024-06-08 DIAGNOSIS — G893 Neoplasm related pain (acute) (chronic): Secondary | ICD-10-CM

## 2024-06-08 DIAGNOSIS — C8408 Mycosis fungoides, lymph nodes of multiple sites: Secondary | ICD-10-CM

## 2024-06-08 DIAGNOSIS — Z5111 Encounter for antineoplastic chemotherapy: Secondary | ICD-10-CM | POA: Diagnosis not present

## 2024-06-08 LAB — CMP (CANCER CENTER ONLY)
ALT: 12 U/L (ref 0–44)
AST: 17 U/L (ref 15–41)
Albumin: 3.2 g/dL — ABNORMAL LOW (ref 3.5–5.0)
Alkaline Phosphatase: 58 U/L (ref 38–126)
Anion gap: 10 (ref 5–15)
BUN: 20 mg/dL (ref 8–23)
CO2: 26 mmol/L (ref 22–32)
Calcium: 8.6 mg/dL — ABNORMAL LOW (ref 8.9–10.3)
Chloride: 102 mmol/L (ref 98–111)
Creatinine: 0.9 mg/dL (ref 0.61–1.24)
GFR, Estimated: >60 mL/min (ref >60)
Glucose, Bld: 123 mg/dL — ABNORMAL HIGH (ref 70–99)
Potassium: 3.7 mmol/L (ref 3.5–5.1)
Sodium: 138 mmol/L (ref 135–145)
Total Bilirubin: 0.7 mg/dL (ref 0.0–1.2)
Total Protein: 6.2 g/dL — ABNORMAL LOW (ref 6.5–8.1)

## 2024-06-08 LAB — CBC WITH DIFFERENTIAL (CANCER CENTER ONLY)
Abs Immature Granulocytes: 0.11 K/uL — ABNORMAL HIGH (ref 0.00–0.07)
Basophils Absolute: 0.1 K/uL (ref 0.0–0.1)
Basophils Relative: 1 %
Eosinophils Absolute: 0.1 K/uL (ref 0.0–0.5)
Eosinophils Relative: 2 %
HCT: 38.1 % — ABNORMAL LOW (ref 39.0–52.0)
Hemoglobin: 12.3 g/dL — ABNORMAL LOW (ref 13.0–17.0)
Immature Granulocytes: 2 %
Lymphocytes Relative: 12 %
Lymphs Abs: 0.8 K/uL (ref 0.7–4.0)
MCH: 31.3 pg (ref 26.0–34.0)
MCHC: 32.3 g/dL (ref 30.0–36.0)
MCV: 96.9 fL (ref 80.0–100.0)
Monocytes Absolute: 0.9 K/uL (ref 0.1–1.0)
Monocytes Relative: 14 %
Neutro Abs: 4.6 K/uL (ref 1.7–7.7)
Neutrophils Relative %: 69 %
Platelet Count: 225 K/uL (ref 150–400)
RBC: 3.93 MIL/uL — ABNORMAL LOW (ref 4.22–5.81)
RDW: 17.4 % — ABNORMAL HIGH (ref 11.5–15.5)
WBC Count: 6.5 K/uL (ref 4.0–10.5)
nRBC: 0 % (ref 0.0–0.2)

## 2024-06-08 LAB — LIPID PANEL
Cholesterol: 231 mg/dL — ABNORMAL HIGH (ref 0–200)
HDL: 50 mg/dL (ref >40)
LDL Cholesterol: 137 mg/dL — ABNORMAL HIGH (ref 0–99)
Total CHOL/HDL Ratio: 4.6 ratio
Triglycerides: 221 mg/dL — ABNORMAL HIGH (ref <150)
VLDL: 44 mg/dL — ABNORMAL HIGH (ref 0–40)

## 2024-06-08 MED ORDER — PRALATREXATE CHEMO INJECTION 20 MG/ML
15.0000 mg/m2 | Freq: Once | INTRAVENOUS | Status: AC
  Administered 2024-06-08: 30 mg via INTRAVENOUS
  Filled 2024-06-08: qty 1.5

## 2024-06-08 MED ORDER — CYANOCOBALAMIN 1000 MCG/ML IJ SOLN
1000.0000 ug | Freq: Once | INTRAMUSCULAR | Status: AC
  Administered 2024-06-08: 1000 ug via INTRAMUSCULAR
  Filled 2024-06-08: qty 1

## 2024-06-08 MED ORDER — PREGABALIN 100 MG PO CAPS: 100.0000 mg | ORAL_CAPSULE | Freq: Two times a day (BID) | ORAL | 0 refills | Status: DC

## 2024-06-08 MED ORDER — SODIUM CHLORIDE 0.9 % IV SOLN
INTRAVENOUS | Status: DC
  Filled 2024-06-08: qty 250

## 2024-06-08 MED ORDER — MIRTAZAPINE 30 MG PO TABS: 30.0000 mg | ORAL_TABLET | Freq: Every day | ORAL | 1 refills | Status: DC

## 2024-06-08 MED ORDER — PROCHLORPERAZINE MALEATE 10 MG PO TABS
10.0000 mg | ORAL_TABLET | Freq: Once | ORAL | Status: AC
  Administered 2024-06-08: 10 mg via ORAL
  Filled 2024-06-08: qty 1

## 2024-06-08 NOTE — Assessment & Plan Note (Addendum)
#   since 2012-mycosis fungoides -cutaneous T-cell lymphoma-PET scan- JULY 2025-multiple skin lesions and increasing multiple lung nodules concerning for metastatic disease.  Discussed with Dr.Aleskerov- NO lung biopsy.  STAGE IV- JULY 14th- 2025- pralatrexate  [15mg /m2 for 3/4 weeks].05/12/2024- ADDED  bexarotene  225 mg [75 mg 3 pills day along with Pralatrexate ] chemo.   # Unfortunately clinically concerning for progressive disease-with worsening skin lesions-and also scrotal lesions.  Discussed with Dr. Alfonso at [facility] reevaluation at [facility] on September 25th as part of the multidisciplinary team.  SEP 2025-peripheral blood flow-cytometry-NEG.  # Proceed with cycle number 3 day 1 chemotherapy. Labs-CBC/chemistries were reviewed with the patient.  Continue leucovorin  with each pralatrexate  - 25 mg TID; Daily folic acid  supplemental B12.  B12 injection every 6 to 8 weeks.  # Skin itching-Clairtin daily- singulair -continue lyrica   75 mg BID; and increased remeron  30 mg at bedtime- continue atarax / prn benadryl  prn-cannot afford emend - [too expensive]  # Iatrogenic central hypothyroidism:  [sec to bexarotene .]-On thyroid  supplementation.   AUG 2025- TSH- WNL; awaiting- lipid panel.   # Prostate - pet scan with increased FDG avidity. PSA normal recently.-Monitor for now.  # ACP: s/p pallaitive referral.   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   # Constipation- add stool softener/laxative as needed.  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   Weekly chemo- q 2 week- MD; 2 cycles-   PS # DISPOSITION: # chemo today-  # Weekly lab- cbc/cmp # as per [facility]-Dr.B  Addendum: Discussed with patient and wife regarding the importance of following up with [facility], Dr. Alfonso for further recommendations.  Patient reluctantly agrees to follow-up-however endorses strong preference to be treated locally given the logistical  issues.  I have conveyed this to Dr. Jacques.

## 2024-06-08 NOTE — Progress Notes (Signed)
 [facility] CONSULT NOTE  Patient Care Team: Sadie Manna, MD as PCP - General (Internal Medicine) Rennie Cindy SAUNDERS, MD as Consulting Physician (Oncology)  CHIEF COMPLAINTS/PURPOSE OF CONSULTATION: mycosis fungoidies  Oncology History Overview Note  Diagnosis: Mycosis fungoides with minimal CD30+ (less than 1%) on 06/2022 and 09/2023 biopsy, CD5+ in 50% Date of Diagnosis: 2012 Stage: T1bN0M0B0 (stage 1b) at diagnosis 07/20/22 and 10/14/23- T3N0M0B0 (stage IIB) but in 2025 he had multiple tumors coming up so disease activity had clearly changed but PET scan negative for extracutaneous disease  Regimen:  Steroid creams since 2013 [facility] phototherapy started 2014-09/2014 Methotrexate 20mg /week 2019-2020 and then not see by derm for several years until10/2023 Radiation: 2400 cGy to his right lower arm lesions and left arm lesion. 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 No convincing evidence of nodal or visceral involvement. # MAY 2025- CT chest- IMPRESSION: 1. Numerous new bilateral pulmonary nodules, measuring up to 10 mm. Imaging features are highly concerning for metastatic disease.  # Patient currently status post multiple lines of therapy -most recently noted to have  progression with new tumors despite bexarotene .    # JULY 17th, 2025- pralatrexate - 15mg /m2 for 3/4 weeks; AUG 15th- added Bexarotene     Mycosis fungoides, lymph nodes of multiple sites (HCC)  05/24/2012 Initial Diagnosis   Mycosis fungoides,  unspecified site (HCC)   04/03/2024 Cancer Staging   Staging form: Mycosis Fungoides and Sezary Syndrome, AJCC 8th Edition - Clinical: Stage IVB (cT4, cN0, pM1, B0) - Signed by Rennie Cindy SAUNDERS, MD on 04/03/2024   04/13/2024 -  Chemotherapy   Patient is on Treatment Plan : NON-HODGKINS LYMPHOMA T-CELL Pralatrexate  q49d        HISTORY OF PRESENTING ILLNESS: Patient ambulating-independently. With wife.   Camellia SAUNDERS Rattler 78 y.o.  male pleasant patient with a longstanding history of T-cell cutaneous lymphoma/mycosis fungoides status post multiple lines of therapy currently on palliative chemotherapy- pralatrexate  + bexarotene   is here for a follow up.  Patient complains of worsening itching.  Patient also noted to have worsening scrotal lesions.   Also the nodular lesions worsening are causing worsening pain. Currently on oxycodone .  Patient did not tolerate long-acting Xtampza -because of mental status changes extreme fatigue.  Patient notes have fair Appetite. 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   Review of Systems  Constitutional:  Positive for malaise/fatigue and weight loss. Negative for chills, diaphoresis and fever.  HENT:  Negative for nosebleeds and sore throat.   Eyes:  Negative for double vision.  Respiratory:  Negative for cough, hemoptysis, sputum production, shortness of breath and wheezing.   Cardiovascular:  Negative for chest pain, palpitations, orthopnea and leg swelling.  Gastrointestinal:  Negative for abdominal pain, blood in stool, constipation, diarrhea, heartburn, melena, nausea and vomiting.  Genitourinary:  Negative for dysuria, frequency and urgency.  Musculoskeletal:  Negative for back pain and joint pain.  Skin:  Positive for itching and rash.  Neurological:  Negative for dizziness, tingling, focal weakness, weakness and headaches.  Endo/Heme/Allergies:  Does not bruise/bleed easily.  Psychiatric/Behavioral:  Negative for depression. The patient is not  nervous/anxious and does  not have insomnia.     MEDICAL HISTORY:  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HCC)    Pneumonia    Sleep apnea     SURGICAL HISTORY: Past Surgical History:  Procedure Laterality Date   ANTERIOR LAT LUMBAR FUSION N/A 04/08/2021   Procedure: Lumbar one-two Lumbar two-three Anterolateral lumbar interbody fusion with posterior percutaneous fixation Lumbar one to Lumbar three, removal of old hardware at Lumbar four-five;  Surgeon: Colon Shove, MD;  Location: [facility] OR;  Service: Neurosurgery;  Laterality: N/A;   BACK SURGERY     BIOPSY  07/20/2023   Procedure: BIOPSY;  Surgeon: Maryruth Ole DASEN, MD;  Location: [facility] ENDOSCOPY;  Service: Endoscopy;;   CARDIAC CATHETERIZATION     COLONOSCOPY WITH PROPOFOL  N/A 12/22/2016   Procedure: COLONOSCOPY WITH PROPOFOL ;  Surgeon: Gladis RAYMOND Mariner, MD;  Location: [facility] ENDOSCOPY;  Service: Endoscopy;  Laterality: N/A;   CORONARY ANGIOPLASTY WITH STENT PLACEMENT     ESOPHAGOGASTRODUODENOSCOPY N/A 09/12/2021   Procedure: ESOPHAGOGASTRODUODENOSCOPY (EGD);  Surgeon: Onita Elspeth Sharper, DO;  Location: [facility] ENDOSCOPY;  Service: Gastroenterology;  Laterality: N/A;   ESOPHAGOGASTRODUODENOSCOPY (EGD) WITH PROPOFOL  N/A 07/20/2023   Procedure: ESOPHAGOGASTRODUODENOSCOPY (EGD) WITH PROPOFOL ;  Surgeon: Maryruth Ole DASEN, MD;  Location: [facility] ENDOSCOPY;  Service: Endoscopy;  Laterality: N/A;   EYE SURGERY     LUMBAR FUSION  10/28/2009   L3-5 PLIF   LUMBAR PERCUTANEOUS PEDICLE SCREW 2 LEVEL N/A 04/08/2021   Procedure: LUMBAR PERCUTANEOUS PEDICLE SCREW LUMBAR ONE TO THREE;  Surgeon: Colon Shove, MD;  Location: [facility] OR;  Service: Neurosurgery;  Laterality: N/A;   Neck fusion  1995   PORTA CATH INSERTION N/A 04/06/2024   Procedure: PORTA CATH INSERTION;  Surgeon: Marea Selinda RAMAN, MD;   Location: [facility] INVASIVE CV LAB;  Service: Cardiovascular;  Laterality: N/A;   TONSILLECTOMY      SOCIAL HISTORY: Social History   Socioeconomic History   Marital status: Married    Spouse name: Not on file   Number of children: Not on file   Years of education: Not on file   Highest education level: Not on file  Occupational History   Not on file  Tobacco Use   Smoking status: Former   Smokeless tobacco: Never   Tobacco comments:    qiut age 76  Vaping Use   Vaping status: Never Used  Substance and Sexual Activity   Alcohol  use: Yes    Alcohol /week: 0.0 standard drinks of alcohol     Comment: social   Drug use: No   Sexual activity: Not on file  Other Topics Concern   Not on file  Social History Narrative   Not on file   Social Drivers of Health   Financial Resource Strain: Low Risk  (01/18/2024)   Received from [facility]   Overall Financial Resource Strain (CARDIA)    Difficulty of Paying Living Expenses: Not hard at all  Food Insecurity: No Food Insecurity (03/29/2024)   Hunger Vital Sign    Worried About Running Out of Food in the Last Year: Never true    Ran Out of Food in the Last Year: Never true  Transportation Needs: No Transportation Needs (03/29/2024)   PRAPARE - Administrator, Civil Service (Medical): No  Lack of Transportation (Non-Medical): No  Physical Activity: Not on file  Stress: No Stress Concern Present (06/18/2022)   [employer] of Occupational Health - Occupational Stress Questionnaire    Feeling of Stress : Not at all  Social Connections: Socially Integrated (06/18/2022)   Social Connection and Isolation Panel    Frequency of Communication with Friends and Family: More than three times a week    Frequency of Social Gatherings with Friends and Family: More than three times a week    Attends Religious Services: More than 4 times per year    Active Member of [employer] or Organizations: Yes    Attends Museum/gallery exhibitions officer: More than 4 times per year    Marital Status: Married  Catering manager Violence: Not At Risk (03/29/2024)   Humiliation, Afraid, Rape, and Kick questionnaire    Fear of Current or Ex-Partner: No    Emotionally Abused: No    Physically Abused: No    Sexually Abused: No    FAMILY HISTORY: Family History  Problem Relation Age of Onset   Heart attack Father        Brother   Diabetes Mellitus II Brother    Prostate cancer Neg Hx     ALLERGIES:  is allergic to sulfa antibiotics.  MEDICATIONS:  Current Outpatient Medications  Medication Sig Dispense Refill   acetaminophen  (TYLENOL ) 500 MG tablet Take 1,000 mg by mouth every 6 (six) hours as needed for moderate pain or headache.     amLODipine (NORVASC) 5 MG tablet Take 5 mg by mouth daily.     aprepitant  (EMEND ) 125 MG capsule Take 125 mg capsule by mouth on day 1. Then take one 80 mg capsule daily on days 2 and 3. Repeat every 14 days. 2 capsule 0   aprepitant  (EMEND ) 80 MG capsule Take 125 mg capsule by mouth on day 1. Then take one 80 mg capsule daily on days 2 and 3. Repeat every 14 days. 4 capsule 0   atenolol  (TENORMIN ) 25 MG tablet Take by mouth daily.     atorvastatin  (LIPITOR) 20 MG tablet Take 20 mg by mouth daily.     augmented betamethasone  dipropionate (DIPROLENE -AF) 0.05 % ointment Apply topically 2 (two) times daily.     benzonatate  (TESSALON ) 100 MG capsule Take 2 capsules (200 mg total) by mouth 3 (three) times daily as needed for cough. 90 capsule 1   bexarotene  (TARGRETIN ) 75 MG CAPS capsule Take 3 capsules (225 mg total) by mouth daily with breakfast. Take with food. (Patient taking differently: Take 225 mg by mouth daily with breakfast. Take with food.HE TAKES 75 MG TID) 90 capsule 2   clobetasol cream (TEMOVATE) 0.05 % Apply 1 application topically 2 (two) times daily.     doxycycline (VIBRAMYCIN) 100 MG capsule Take 100 mg by mouth 2 (two) times daily.     folic acid  (FOLVITE ) 1 MG tablet Take  1 tablet (1 mg total) by mouth daily. 90 tablet 6   furosemide  (LASIX ) 20 MG tablet Take 2 tablets (40 mg total) by mouth daily for 7 days. (Patient taking differently: Take 40 mg by mouth daily. PRN) 7 tablet 0   hydrOXYzine  (ATARAX ) 25 MG tablet Take 1 tablet (25 mg total) by mouth every 6 (six) hours as needed for itching (may cause drowsiness). 120 tablet 0   leucovorin  (WELLCOVORIN ) 25 MG tablet Take 1 tablet (25 mg total) by mouth 3 (three) times daily. Take Three times a day- Take for 2  consecutive days [a total of 6 doses] beginning 24 hours after each dose of Pralatrexate  chemotherapy. 60 tablet 3   lidocaine -prilocaine  (EMLA ) cream Apply 1 Application topically as needed. Apply to port and cover with saran wrap 1-2 hours prior to port access 30 g 3   montelukast  (SINGULAIR ) 10 MG tablet Take 1 tablet (10 mg total) by mouth at bedtime. 30 tablet 3   naloxone  (NARCAN ) nasal spray 4 mg/0.1 mL SPRAY 1 SPRAY INTO ONE NOSTRIL AS DIRECTED FOR OPIOID OVERDOSE (TURN PERSON ON SIDE AFTER DOSE. IF NO RESPONSE IN 2-3 MINUTES OR PERSON RESPONDS BUT RELAPSES, REPEAT USING A NEW SPRAY DEVICE AND SPRAY INTO THE OTHER NOSTRIL. CALL 911 AFTER USE.) * EMERGENCY USE ONLY * 1 each 0   ondansetron  (ZOFRAN ) 8 MG tablet Take 1 tablet (8 mg total) by mouth every 8 (eight) hours as needed for nausea or vomiting. 60 tablet 1   Oxycodone  HCl 10 MG TABS Take 0.5-1 tablets (5-10 mg total) by mouth every 4 (four) hours as needed (pain). 60 tablet 0   pantoprazole  (PROTONIX ) 40 MG tablet Take 40 mg by mouth daily as needed (acid reflux).     potassium chloride  SA (KLOR-CON  M) 20 MEQ tablet Take 1 tablet (20 mEq total) by mouth daily for 7 days. (Patient taking differently: Take 20 mEq by mouth daily. PRN IF USING THE LASIX ) 5 tablet 0   prochlorperazine  (COMPAZINE ) 10 MG tablet Take 1 tablet (10 mg total) by mouth every 6 (six) hours as needed for nausea or vomiting. 60 tablet 1   mirtazapine  (REMERON ) 30 MG tablet Take 1  tablet (30 mg total) by mouth at bedtime. 30 tablet 1   pregabalin  (LYRICA ) 100 MG capsule Take 1 capsule (100 mg total) by mouth 2 (two) times daily. 60 capsule 0   No current facility-administered medications for this visit.    PHYSICAL EXAMINATION:   Vitals:   06/08/24 0837  BP: 132/64  Pulse: 64  Resp: 16  Temp: 99.2 F (37.3 C)  SpO2: 98%   Filed Weights   06/08/24 0837  Weight: 196 lb 4.8 oz (89 kg)    Physical Exam Vitals and nursing note reviewed.  HENT:     Head: Normocephalic and atraumatic.     Mouth/Throat:     Pharynx: Oropharynx is clear.  Eyes:     Extraocular Movements: Extraocular movements intact.     Pupils: Pupils are equal, round, and reactive to light.  Cardiovascular:     Rate and Rhythm: Normal rate and regular rhythm.  Pulmonary:     Comments: Decreased breath sounds bilaterally.  Abdominal:     Palpations: Abdomen is soft.  Musculoskeletal:        General: Normal range of motion.     Cervical back: Normal range of motion.  Skin:    General: Skin is warm.  Neurological:     General: No focal deficit present.     Mental Status: He is alert and oriented to person, place, and time.  Psychiatric:        Behavior: Behavior normal.        Judgment: Judgment normal.              AUG 14th, 2025- BEFORE CYCLE # 2             PRIOR TO CYCLE #-2 days #15-                PRIOR to cycle #3 day-1 [06/08/2024]  LABORATORY DATA:  I have reviewed the data as listed Lab Results  Component Value Date   WBC 6.5 06/08/2024   HGB 12.3 (L) 06/08/2024   HCT 38.1 (L) 06/08/2024   MCV 96.9 06/08/2024   PLT 225 06/08/2024   Recent Labs    05/25/24 0854 06/01/24 1316 06/08/24 0843  NA 138 134* 138  K 3.6 3.9 3.7  CL 106 101 102  CO2 25 22 26   GLUCOSE 114* 202* 123*  BUN 24* 21 20  CREATININE 0.92 1.00 0.90  CALCIUM  8.9 8.7* 8.6*  GFRNONAA >60 >60 >60  PROT 6.4* 6.3* 6.2*  ALBUMIN 3.2*  3.3* 3.2*  AST 20 19 17   ALT 13 13 12   ALKPHOS 71 67 58  BILITOT 0.5 0.5 0.7    RADIOGRAPHIC STUDIES: I have personally reviewed the radiological images as listed and agreed with the findings in the report. No results found.    Mycosis fungoides, lymph nodes of multiple sites (HCC) # since 2012-mycosis fungoides -cutaneous T-cell lymphoma-PET scan- JULY 2025-multiple skin lesions and increasing multiple lung nodules concerning for metastatic disease.  Discussed with Dr.Aleskerov- NO lung biopsy.  STAGE IV- JULY 14th- 2025- pralatrexate  [15mg /m2 for 3/4 weeks].05/12/2024- ADDED  bexarotene  225 mg [75 mg 3 pills day along with Pralatrexate ] chemo.   # Unfortunately clinically concerning for progressive disease-with worsening skin lesions-and also scrotal lesions.  Discussed with Dr. Alfonso at [facility] reevaluation at [facility] on September 25th as part of the multidisciplinary team.  SEP 2025-peripheral blood flow-cytometry-NEG.  # Proceed with cycle number 3 day 1 chemotherapy. Labs-CBC/chemistries were reviewed with the patient.  Continue leucovorin  with each pralatrexate  - 25 mg TID; Daily folic acid  supplemental B12.  B12 injection every 6 to 8 weeks.  # Skin itching-Clairtin daily- singulair -continue lyrica   75 mg BID; and increased remeron  30 mg at bedtime- continue atarax / prn benadryl  prn-cannot afford emend - [too expensive]  # Iatrogenic central hypothyroidism:  [sec to bexarotene .]-On thyroid  supplementation.   AUG 2025- TSH- WNL; awaiting- lipid panel.   # Prostate - pet scan with increased FDG avidity. PSA normal recently.-Monitor for now.  # ACP: s/p pallaitive referral.   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   # Constipation- add stool softener/laxative as needed.  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   Weekly chemo- q 2 week- MD; 2 cycles-   PS # DISPOSITION: # chemo today-  #  Weekly lab- cbc/cmp # as per [facility]-Dr.B  Addendum: Discussed with patient and wife regarding the importance of following up with [facility], Dr. Alfonso for further recommendations.  Patient reluctantly agrees to follow-up-however endorses strong preference to be treated locally given the logistical issues.  I have conveyed this to Dr. Jacques.    Above plan of care was discussed with patient/family in detail.  My contact information was given to the patient/family.      Cindy JONELLE Joe, MD 06/08/2024 8:26 PM

## 2024-06-08 NOTE — Progress Notes (Signed)
 Palliative Medicine [facility] Telephone:([phone]) [phone] [phone]) [phone]   Name: Manuel Cook Date: 06/08/2024 MRN: [id number]  DOB: 06/11/1946  Patient Care Team: Sadie Manna, MD as PCP - General (Internal Medicine) Rennie Cindy JONELLE, MD as Consulting Physician (Oncology)    REASON FOR CONSULTATION: Manuel Cook is a 78 y.o. 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  Patient is referred to palliative care to address goals.   SOCIAL HISTORY:     reports that he has quit smoking. He has never used smokeless tobacco. He reports current alcohol  use. He reports that he does not use drugs.  Patient is married lives at home with his wife.  He has 3 sons who live nearby.  Patient retired as an Personnel officer.  ADVANCE DIRECTIVES:    CODE STATUS:   PAST MEDICAL HISTORY: 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HCC)    Pneumonia    Sleep apnea     PAST SURGICAL HISTORY:  Past Surgical History:  Procedure Laterality Date   ANTERIOR LAT LUMBAR FUSION N/A 04/08/2021   Procedure: Lumbar one-two Lumbar two-three Anterolateral lumbar interbody fusion with posterior percutaneous fixation Lumbar one to Lumbar three, removal of old hardware at Lumbar four-five;  Surgeon: Colon Shove, MD;  Location: [facility] OR;  Service: Neurosurgery;  Laterality: N/A;   BACK SURGERY     BIOPSY  07/20/2023   Procedure: BIOPSY;  Surgeon: Maryruth Ole DASEN, MD;  Location: [facility] ENDOSCOPY;  Service: Endoscopy;;   CARDIAC CATHETERIZATION     COLONOSCOPY WITH PROPOFOL  N/A 12/22/2016   Procedure: COLONOSCOPY WITH PROPOFOL ;  Surgeon: Gladis RAYMOND Mariner, MD;  Location: [facility] ENDOSCOPY;  Service: Endoscopy;   Laterality: N/A;   CORONARY ANGIOPLASTY WITH STENT PLACEMENT     ESOPHAGOGASTRODUODENOSCOPY N/A 09/12/2021   Procedure: ESOPHAGOGASTRODUODENOSCOPY (EGD);  Surgeon: Onita Elspeth Sharper, DO;  Location: [facility] ENDOSCOPY;  Service: Gastroenterology;  Laterality: N/A;   ESOPHAGOGASTRODUODENOSCOPY (EGD) WITH PROPOFOL  N/A 07/20/2023   Procedure: ESOPHAGOGASTRODUODENOSCOPY (EGD) WITH PROPOFOL ;  Surgeon: Maryruth Ole DASEN, MD;  Location: [facility] ENDOSCOPY;  Service: Endoscopy;  Laterality: N/A;   EYE SURGERY     LUMBAR FUSION  10/28/2009   L3-5 PLIF   LUMBAR PERCUTANEOUS PEDICLE SCREW 2 LEVEL N/A 04/08/2021   Procedure: LUMBAR PERCUTANEOUS PEDICLE SCREW LUMBAR ONE TO THREE;  Surgeon: Colon Shove, MD;  Location: [facility] OR;  Service: Neurosurgery;  Laterality: N/A;   Neck fusion  1995   PORTA CATH INSERTION N/A 04/06/2024   Procedure: PORTA CATH INSERTION;  Surgeon: Marea Selinda RAMAN, MD;  Location: [facility] INVASIVE CV LAB;  Service: Cardiovascular;  Laterality: N/A;   TONSILLECTOMY      HEMATOLOGY/ONCOLOGY HISTORY:  Oncology History Overview Note  Diagnosis: Mycosis fungoides with minimal CD30+ (less than 1%) on 06/2022 and 09/2023 biopsy, CD5+ in 50% Date of Diagnosis: 2012 Stage: T1bN0M0B0 (stage 1b) at diagnosis 07/20/22 and 10/14/23- T3N0M0B0 (stage IIB) but in 2025 he had multiple tumors coming up so disease activity had clearly changed but PET scan negative for extracutaneous disease  Regimen:  Steroid creams since 2013 [facility] phototherapy started 2014-09/2014 Methotrexate 20mg /week 2019-2020 and then not see by derm for several years until10/2023 Radiation: 2400  cGy to his right lower arm lesions and left arm lesion. 08/2022 09/2023 - progressive tumors - biopsy of skin cd30-, CD5+, CD4-/CD8- - no large cell transformation  10/2023-01/28/2024 - gemcitabine  x 4 cycles --> The tumors resolved with gemcitabine  but patch/plaque disease on his skin elsewhere progressed 03/03/24 - bexarotene  225mg /day   # since  2012- Cutaneous T-cell lymphoma-# FEB 2025- PET [facility]-  Extensive cutaneous/subcutaneous hypermetabolic lesions in the bilateral upper and lower extremities, consistent with history of cutaneous T-cell lymphoma. No convincing evidence of nodal or visceral involvement. # MAY 2025- CT chest- IMPRESSION: 1. Numerous new bilateral pulmonary nodules, measuring up to 10 mm. Imaging features are highly concerning for metastatic disease.  # Patient currently status post multiple lines of therapy -most recently noted to have  progression with new tumors despite bexarotene .    # JULY 17th, 2025- pralatrexate - 15mg /m2 for 3/4 weeks.   Mycosis fungoides, lymph nodes of multiple sites (HCC)  05/24/2012 Initial Diagnosis   Mycosis fungoides, unspecified site (HCC)   04/03/2024 Cancer Staging   Staging form: Mycosis Fungoides and Sezary Syndrome, AJCC 8th Edition - Clinical: Stage IVB (cT4, cN0, pM1, B0) - Signed by Rennie Cindy SAUNDERS, MD on 04/03/2024   04/13/2024 -  Chemotherapy   Patient is on Treatment Plan : NON-HODGKINS LYMPHOMA T-CELL Pralatrexate  q49d       ALLERGIES:  is allergic to sulfa antibiotics.  MEDICATIONS:  Current Outpatient Medications  Medication Sig Dispense Refill   pregabalin  (LYRICA ) 100 MG capsule Take 1 capsule (100 mg total) by mouth 2 (two) times daily. 60 capsule 0   acetaminophen  (TYLENOL ) 500 MG tablet Take 1,000 mg by mouth every 6 (six) hours as needed for moderate pain or headache.     amLODipine (NORVASC) 5 MG tablet Take 5 mg by mouth daily.     aprepitant  (EMEND ) 125 MG capsule Take 125 mg capsule by mouth on day 1. Then take one 80 mg capsule daily on days 2 and 3. Repeat every 14 days. 2 capsule 0   aprepitant  (EMEND ) 80 MG capsule Take 125 mg capsule by mouth on day 1. Then take one 80 mg capsule daily on days 2 and 3. Repeat every 14 days. 4 capsule 0   atenolol  (TENORMIN ) 25 MG tablet Take by mouth daily.     atorvastatin  (LIPITOR) 20 MG tablet Take 20 mg by mouth  daily.     augmented betamethasone  dipropionate (DIPROLENE -AF) 0.05 % ointment Apply topically 2 (two) times daily.     benzonatate  (TESSALON ) 100 MG capsule Take 2 capsules (200 mg total) by mouth 3 (three) times daily as needed for cough. 90 capsule 1   bexarotene  (TARGRETIN ) 75 MG CAPS capsule Take 3 capsules (225 mg total) by mouth daily with breakfast. Take with food. (Patient taking differently: Take 225 mg by mouth daily with breakfast. Take with food.HE TAKES 75 MG TID) 90 capsule 2   clobetasol cream (TEMOVATE) 0.05 % Apply 1 application topically 2 (two) times daily.     doxycycline (VIBRAMYCIN) 100 MG capsule Take 100 mg by mouth 2 (two) times daily.     folic acid  (FOLVITE ) 1 MG tablet Take 1 tablet (1 mg total) by mouth daily. 90 tablet 6   furosemide  (LASIX ) 20 MG tablet Take 2 tablets (40 mg total) by mouth daily for 7 days. (Patient taking differently: Take 40 mg by mouth daily. PRN) 7 tablet 0   hydrOXYzine  (ATARAX ) 25 MG tablet Take 1 tablet (25 mg total) by mouth every  6 (six) hours as needed for itching (may cause drowsiness). 120 tablet 0   leucovorin  (WELLCOVORIN ) 25 MG tablet Take 1 tablet (25 mg total) by mouth 3 (three) times daily. Take Three times a day- Take for 2 consecutive days [a total of 6 doses] beginning 24 hours after each dose of Pralatrexate  chemotherapy. 60 tablet 3   lidocaine -prilocaine  (EMLA ) cream Apply 1 Application topically as needed. Apply to port and cover with saran wrap 1-2 hours prior to port access 30 g 3   mirtazapine  (REMERON ) 15 MG tablet Take 1 tablet (15 mg total) by mouth at bedtime. 30 tablet 1   montelukast  (SINGULAIR ) 10 MG tablet Take 1 tablet (10 mg total) by mouth at bedtime. 30 tablet 3   naloxone  (NARCAN ) nasal spray 4 mg/0.1 mL SPRAY 1 SPRAY INTO ONE NOSTRIL AS DIRECTED FOR OPIOID OVERDOSE (TURN PERSON ON SIDE AFTER DOSE. IF NO RESPONSE IN 2-3 MINUTES OR PERSON RESPONDS BUT RELAPSES, REPEAT USING A NEW SPRAY DEVICE AND SPRAY INTO THE  OTHER NOSTRIL. CALL 911 AFTER USE.) * EMERGENCY USE ONLY * 1 each 0   ondansetron  (ZOFRAN ) 8 MG tablet Take 1 tablet (8 mg total) by mouth every 8 (eight) hours as needed for nausea or vomiting. 60 tablet 1   Oxycodone  HCl 10 MG TABS Take 0.5-1 tablets (5-10 mg total) by mouth every 4 (four) hours as needed (pain). 60 tablet 0   pantoprazole  (PROTONIX ) 40 MG tablet Take 40 mg by mouth daily as needed (acid reflux).     potassium chloride  SA (KLOR-CON  M) 20 MEQ tablet Take 1 tablet (20 mEq total) by mouth daily for 7 days. (Patient taking differently: Take 20 mEq by mouth daily. PRN IF USING THE LASIX ) 5 tablet 0   prochlorperazine  (COMPAZINE ) 10 MG tablet Take 1 tablet (10 mg total) by mouth every 6 (six) hours as needed for nausea or vomiting. 60 tablet 1   No current facility-administered medications for this visit.   Facility-Administered Medications Ordered in Other Visits  Medication Dose Route Frequency Provider Last Rate Last Admin   0.9 %  sodium chloride  infusion   Intravenous Continuous Brahmanday, Govinda R, MD       PRALAtrexate  (FOLOTYN ) chemo injection 30 mg  15 mg/m2 (Treatment Plan Recorded) Intravenous Once Brahmanday, Govinda R, MD       prochlorperazine  (COMPAZINE ) tablet 10 mg  10 mg Oral Once Brahmanday, Govinda R, MD        VITAL SIGNS: There were no vitals taken for this visit. There were no vitals filed for this visit.   Estimated body mass index is 30.74 kg/m as calculated from the following:   Height as of an earlier encounter on 06/08/24: 5' 7 (1.702 m).   Weight as of an earlier encounter on 06/08/24: 196 lb 4.8 oz (89 kg).  LABS: CBC:    Component Value Date/Time   WBC 6.5 06/08/2024 0843   WBC 9.3 10/28/2023 1912   HGB 12.3 (L) 06/08/2024 0843   HCT 38.1 (L) 06/08/2024 0843   PLT 225 06/08/2024 0843   MCV 96.9 06/08/2024 0843   NEUTROABS 4.6 06/08/2024 0843   LYMPHSABS 0.8 06/08/2024 0843   MONOABS 0.9 06/08/2024 0843   EOSABS 0.1 06/08/2024 0843    BASOSABS 0.1 06/08/2024 0843   Comprehensive Metabolic Panel:    Component Value Date/Time   NA 138 06/08/2024 0843   K 3.7 06/08/2024 0843   CL 102 06/08/2024 0843   CO2 26 06/08/2024 0843   BUN  20 06/08/2024 0843   CREATININE 0.90 06/08/2024 0843   GLUCOSE 123 (H) 06/08/2024 0843   CALCIUM  8.6 (L) 06/08/2024 0843   AST 17 06/08/2024 0843   ALT 12 06/08/2024 0843   ALKPHOS 58 06/08/2024 0843   BILITOT 0.7 06/08/2024 0843   PROT 6.2 (L) 06/08/2024 0843   ALBUMIN 3.2 (L) 06/08/2024 0843    RADIOGRAPHIC STUDIES: No results found.   PERFORMANCE STATUS (ECOG) : 1 - Symptomatic but completely ambulatory  Review of Systems Unless otherwise noted, a complete review of systems is negative.  Physical Exam General: NAD Pulmonary: Unlabored Abdomen: soft, nontender, + bowel sounds GU: no suprapubic tenderness Extremities: no edema, no joint deformities Skin: Rash in various stages of healing across body Neurological: Weakness but otherwise nonfocal  IMPRESSION: Follow-up visit.  Patient remains on chemotherapy.  No significant improvement with subcutaneous lesions.  In fact, lesions appear to be worsening.  Concern for disease progression.  Dr. Rennie is reviewing and speaking with [facility] regarding options.  Patient continues to endorse poorly controlled pain and pruritus.  He says he tried 1 dose of the Xtampza  ER and it caused significant loopiness and sedation.  He is subsequently self discontinued.  He says he has continued to take oxycodone  IR 10 mg every 4 hours around-the-clock and would like to stick with that regimen for now.  We did discuss possible option of transdermal fentanyl , although I be concerned about finding unaffected skin on which to apply the patch.  Patient says that he is more concerned about the pruritus as this is keeping him awake at night.  Will increase Lyrica  from 75 mg to 100 mg twice daily.  Also discussed possible options of calamine/oatmeal baths,  which apparently were also recommended by [facility].  Patient is also on montelukast , prednisone , hydroxyzine , and as needed diphenhydramine .  Discussed use of cool compresses for itching.  PLAN: -Continue current scope of treatment -Continue oxycodone  IR as needed -Discontinue Xtampza  -Increase Lyrica  100 mg twice daily #60 -PDMP reviewed -Daily bowel regimen - RTC in 2 weeks   Patient expressed understanding and was in agreement with this plan. He also understands that He can call the clinic at any time with any questions, concerns, or complaints.     Time Total: 20 minutes  Visit consisted of counseling and education dealing with the complex and emotionally intense issues of symptom management and palliative care in the setting of serious and potentially life-threatening illness.Greater than 50%  of this time was spent counseling and coordinating care related to the above assessment and plan.  Signed by: Fonda Mower, PhD, NP-C

## 2024-06-08 NOTE — Progress Notes (Signed)
 Pt states the oxycodone  q4 hours hasn't been  helping as much. The q12 knocked him out. Didn't want to take. Asking what is a better way to take, or something else? His lesions are worse and red, hurting/itching worse. He's concerned he's progressing. Wandering if the tx is making skin worse?  Now has lesions on scrotum. Hard to sit and not sure what to put on it. Also, has fluid/swelling.  Pt would like to know if he can stop any of his meds? He feels like a zombie.

## 2024-06-14 ENCOUNTER — Encounter: Admitting: Physician Assistant

## 2024-06-14 DIAGNOSIS — S51001A Unspecified open wound of right elbow, initial encounter: Secondary | ICD-10-CM | POA: Diagnosis not present

## 2024-06-14 DIAGNOSIS — C8408 Mycosis fungoides, lymph nodes of multiple sites: Secondary | ICD-10-CM | POA: Diagnosis not present

## 2024-06-14 DIAGNOSIS — S21209A Unspecified open wound of unspecified back wall of thorax without penetration into thoracic cavity, initial encounter: Secondary | ICD-10-CM | POA: Diagnosis not present

## 2024-06-14 DIAGNOSIS — C9150 Adult T-cell lymphoma/leukemia (HTLV-1-associated) not having achieved remission: Secondary | ICD-10-CM | POA: Diagnosis not present

## 2024-06-15 ENCOUNTER — Inpatient Hospital Stay

## 2024-06-15 VITALS — BP 154/54 | HR 64 | Temp 99.3°F | Resp 17 | Wt 196.0 lb

## 2024-06-15 DIAGNOSIS — C8408 Mycosis fungoides, lymph nodes of multiple sites: Secondary | ICD-10-CM

## 2024-06-15 DIAGNOSIS — Z5111 Encounter for antineoplastic chemotherapy: Secondary | ICD-10-CM | POA: Diagnosis not present

## 2024-06-15 LAB — CBC WITH DIFFERENTIAL (CANCER CENTER ONLY)
Abs Immature Granulocytes: 0.06 K/uL (ref 0.00–0.07)
Basophils Absolute: 0.1 K/uL (ref 0.0–0.1)
Basophils Relative: 1 %
Eosinophils Absolute: 0 K/uL (ref 0.0–0.5)
Eosinophils Relative: 0 %
HCT: 34.8 % — ABNORMAL LOW (ref 39.0–52.0)
Hemoglobin: 11.5 g/dL — ABNORMAL LOW (ref 13.0–17.0)
Immature Granulocytes: 1 %
Lymphocytes Relative: 10 %
Lymphs Abs: 0.6 K/uL — ABNORMAL LOW (ref 0.7–4.0)
MCH: 31.6 pg (ref 26.0–34.0)
MCHC: 33 g/dL (ref 30.0–36.0)
MCV: 95.6 fL (ref 80.0–100.0)
Monocytes Absolute: 0.9 K/uL (ref 0.1–1.0)
Monocytes Relative: 17 %
Neutro Abs: 4 K/uL (ref 1.7–7.7)
Neutrophils Relative %: 71 %
Platelet Count: 215 K/uL (ref 150–400)
RBC: 3.64 MIL/uL — ABNORMAL LOW (ref 4.22–5.81)
RDW: 16.7 % — ABNORMAL HIGH (ref 11.5–15.5)
WBC Count: 5.6 K/uL (ref 4.0–10.5)
nRBC: 0 % (ref 0.0–0.2)

## 2024-06-15 LAB — CMP (CANCER CENTER ONLY)
ALT: 15 U/L (ref 0–44)
AST: 20 U/L (ref 15–41)
Albumin: 2.9 g/dL — ABNORMAL LOW (ref 3.5–5.0)
Alkaline Phosphatase: 50 U/L (ref 38–126)
Anion gap: 7 (ref 5–15)
BUN: 14 mg/dL (ref 8–23)
CO2: 24 mmol/L (ref 22–32)
Calcium: 8.5 mg/dL — ABNORMAL LOW (ref 8.9–10.3)
Chloride: 101 mmol/L (ref 98–111)
Creatinine: 1.02 mg/dL (ref 0.61–1.24)
GFR, Estimated: >60 mL/min (ref >60)
Glucose, Bld: 114 mg/dL — ABNORMAL HIGH (ref 70–99)
Potassium: 3.9 mmol/L (ref 3.5–5.1)
Sodium: 132 mmol/L — ABNORMAL LOW (ref 135–145)
Total Bilirubin: 0.8 mg/dL (ref 0.0–1.2)
Total Protein: 6.2 g/dL — ABNORMAL LOW (ref 6.5–8.1)

## 2024-06-15 MED ORDER — SODIUM CHLORIDE 0.9 % IV SOLN
INTRAVENOUS | Status: DC
  Filled 2024-06-15: qty 250

## 2024-06-15 MED ORDER — PRALATREXATE CHEMO INJECTION 20 MG/ML
15.0000 mg/m2 | Freq: Once | INTRAVENOUS | Status: AC
  Administered 2024-06-15: 30 mg via INTRAVENOUS
  Filled 2024-06-15: qty 1.5

## 2024-06-15 MED ORDER — PROCHLORPERAZINE MALEATE 10 MG PO TABS
10.0000 mg | ORAL_TABLET | Freq: Once | ORAL | Status: AC
  Administered 2024-06-15: 10 mg via ORAL
  Filled 2024-06-15: qty 1

## 2024-06-16 ENCOUNTER — Other Ambulatory Visit: Payer: Self-pay

## 2024-06-16 DIAGNOSIS — C8408 Mycosis fungoides, lymph nodes of multiple sites: Secondary | ICD-10-CM

## 2024-06-16 MED ORDER — STERILE WATER FOR INJECTION IJ SOLN
5.0000 mL | Freq: Four times a day (QID) | OROMUCOSAL | 1 refills | Status: DC | PRN
  Filled 2024-06-16: qty 480, 12d supply, fill #0

## 2024-06-16 NOTE — Telephone Encounter (Signed)
 Antibiotic says that this patient is having a lot of mouth pain he is not able to eat hardly anything he had to take his dentures out because it is so sore.  April wanted to see if Dr. Could call in viscous lidocaine  or something that can help.

## 2024-06-16 NOTE — Telephone Encounter (Signed)
 Rx sent, pt notified by message.

## 2024-06-17 ENCOUNTER — Other Ambulatory Visit: Payer: Self-pay

## 2024-06-21 ENCOUNTER — Ambulatory Visit: Admitting: Physician Assistant

## 2024-06-21 DIAGNOSIS — S21202A Unspecified open wound of left back wall of thorax without penetration into thoracic cavity, initial encounter: Secondary | ICD-10-CM | POA: Diagnosis not present

## 2024-06-21 DIAGNOSIS — S81002A Unspecified open wound, left knee, initial encounter: Secondary | ICD-10-CM | POA: Diagnosis not present

## 2024-06-21 DIAGNOSIS — S81001A Unspecified open wound, right knee, initial encounter: Secondary | ICD-10-CM | POA: Diagnosis not present

## 2024-06-22 ENCOUNTER — Ambulatory Visit: Admit: 2024-06-22 | Discharge: 2024-06-23 | Payer: Medicare (Managed Care)

## 2024-06-22 ENCOUNTER — Ambulatory Visit
Admit: 2024-06-22 | Discharge: 2024-06-23 | Payer: Medicare (Managed Care) | Attending: Dermatology | Primary: Dermatology

## 2024-06-22 ENCOUNTER — Inpatient Hospital Stay

## 2024-06-22 ENCOUNTER — Encounter: Payer: Self-pay | Admitting: Internal Medicine

## 2024-06-22 ENCOUNTER — Inpatient Hospital Stay (HOSPITAL_BASED_OUTPATIENT_CLINIC_OR_DEPARTMENT_OTHER): Admitting: Internal Medicine

## 2024-06-22 ENCOUNTER — Inpatient Hospital Stay: Admitting: Hospice and Palliative Medicine

## 2024-06-22 VITALS — BP 162/64 | HR 82 | Resp 18

## 2024-06-22 VITALS — BP 149/58 | HR 85 | Temp 100.2°F | Resp 16 | Ht 67.0 in | Wt 191.0 lb

## 2024-06-22 DIAGNOSIS — Z125 Encounter for screening for malignant neoplasm of prostate: Principal | ICD-10-CM

## 2024-06-22 DIAGNOSIS — Z79899 Other long term (current) drug therapy: Principal | ICD-10-CM

## 2024-06-22 DIAGNOSIS — Z5111 Encounter for antineoplastic chemotherapy: Secondary | ICD-10-CM | POA: Diagnosis not present

## 2024-06-22 DIAGNOSIS — C8400 Mycosis fungoides, unspecified site: Secondary | ICD-10-CM | POA: Diagnosis not present

## 2024-06-22 DIAGNOSIS — C8408 Mycosis fungoides, lymph nodes of multiple sites: Secondary | ICD-10-CM

## 2024-06-22 LAB — CBC WITH DIFFERENTIAL (CANCER CENTER ONLY)
Abs Immature Granulocytes: 0.13 K/uL — ABNORMAL HIGH (ref 0.00–0.07)
Basophils Absolute: 0.1 K/uL (ref 0.0–0.1)
Basophils Relative: 1 %
Eosinophils Absolute: 0.2 K/uL (ref 0.0–0.5)
Eosinophils Relative: 4 %
HCT: 37.4 % — ABNORMAL LOW (ref 39.0–52.0)
Hemoglobin: 12.3 g/dL — ABNORMAL LOW (ref 13.0–17.0)
Immature Granulocytes: 2 %
Lymphocytes Relative: 15 %
Lymphs Abs: 0.9 K/uL (ref 0.7–4.0)
MCH: 31.3 pg (ref 26.0–34.0)
MCHC: 32.9 g/dL (ref 30.0–36.0)
MCV: 95.2 fL (ref 80.0–100.0)
Monocytes Absolute: 0.8 K/uL (ref 0.1–1.0)
Monocytes Relative: 13 %
Neutro Abs: 4 K/uL (ref 1.7–7.7)
Neutrophils Relative %: 65 %
Platelet Count: 307 K/uL (ref 150–400)
RBC: 3.93 MIL/uL — ABNORMAL LOW (ref 4.22–5.81)
RDW: 16.8 % — ABNORMAL HIGH (ref 11.5–15.5)
WBC Count: 6.1 K/uL (ref 4.0–10.5)
nRBC: 0 % (ref 0.0–0.2)

## 2024-06-22 LAB — CMP (CANCER CENTER ONLY)
ALT: 15 U/L (ref 0–44)
AST: 25 U/L (ref 15–41)
Albumin: 3.1 g/dL — ABNORMAL LOW (ref 3.5–5.0)
Alkaline Phosphatase: 58 U/L (ref 38–126)
Anion gap: 9 (ref 5–15)
BUN: 23 mg/dL (ref 8–23)
CO2: 25 mmol/L (ref 22–32)
Calcium: 8.8 mg/dL — ABNORMAL LOW (ref 8.9–10.3)
Chloride: 101 mmol/L (ref 98–111)
Creatinine: 1.19 mg/dL (ref 0.61–1.24)
GFR, Estimated: >60 mL/min (ref >60)
Glucose, Bld: 107 mg/dL — ABNORMAL HIGH (ref 70–99)
Potassium: 3.6 mmol/L (ref 3.5–5.1)
Sodium: 135 mmol/L (ref 135–145)
Total Bilirubin: 0.7 mg/dL (ref 0.0–1.2)
Total Protein: 6.6 g/dL (ref 6.5–8.1)

## 2024-06-22 MED ORDER — LIDOCAINE 5 % TOPICAL OINTMENT
TOPICAL | 11 refills | 0.00000 days | Status: CP
Start: 2024-06-22 — End: ?

## 2024-06-22 MED ORDER — PRALATREXATE CHEMO INJECTION 20 MG/ML
15.0000 mg/m2 | Freq: Once | INTRAVENOUS | Status: AC
  Administered 2024-06-22: 30 mg via INTRAVENOUS
  Filled 2024-06-22: qty 1.5

## 2024-06-22 MED ORDER — PROCHLORPERAZINE MALEATE 10 MG PO TABS
10.0000 mg | ORAL_TABLET | Freq: Once | ORAL | Status: AC
  Administered 2024-06-22: 10 mg via ORAL
  Filled 2024-06-22: qty 1

## 2024-06-22 MED ORDER — SODIUM CHLORIDE 0.9 % IV SOLN
INTRAVENOUS | Status: DC
  Filled 2024-06-22: qty 250

## 2024-06-22 MED ORDER — OXYCODONE HCL 10 MG PO TABS: 5.0000 mg | ORAL_TABLET | ORAL | 0 refills | Status: DC | PRN

## 2024-06-22 NOTE — Assessment & Plan Note (Addendum)
#   since 2012-mycosis fungoides -cutaneous T-cell lymphoma-PET scan- JULY 2025-multiple skin lesions and increasing multiple lung nodules concerning for metastatic disease.  Discussed with Dr.Aleskerov- NO lung biopsy.  STAGE IV- JULY 14th- 2025- pralatrexate  [15mg /m2 for 3/4 weeks].05/12/2024- ADDED  bexarotene  225 mg [75 mg 3 pills day along with Pralatrexate ] chemo.   # Unfortunately clinically concerning for progressive disease-with worsening skin lesions-and also scrotal lesions.  Discussed with Dr. Alfonso at [facility] reevaluation at [facility] on September 25th/today as part of the multidisciplinary team.  SEP 2025-peripheral blood flow-cytometry-NEG.  # Proceed with cycle number 3 day 15  chemotherapy. Labs-CBC/chemistries were reviewed with the patient.  Continue leucovorin  with each pralatrexate  - 25 mg TID; Daily folic acid  supplemental B12.  B12 injection every 6 to 8 weeks.  # Oral mucositis- On LV- on s/p magic mouth wash-   # Skin itching-Clairtin daily- singulair -continue lyrica   75 mg BID; and increased remeron  30 mg at bedtime- continue atarax / prn benadryl  prn-cannot afford emend - [too expensive]  # Iatrogenic central hypothyroidism:  [sec to bexarotene .]-On thyroid  supplementation.   AUG 2025- TSH- WNL; awaiting- lipid panel.   # Prostate - pet scan with increased FDG avidity. PSA normal recently.-Monitor for now.  # ACP: s/p pallaitive referral.   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   # Constipation- add stool softener/laxative as needed.  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   Weekly chemo- q 2 week- MD; 2 cycles-  b12-9/11- q 6 week  PS # DISPOSITION: # chemo today-  # follow up TBD--Dr.B

## 2024-06-22 NOTE — Progress Notes (Signed)
 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 No convincing evidence of nodal or visceral involvement. # MAY 2025- CT chest- IMPRESSION: 1. Numerous new bilateral pulmonary nodules, measuring up to 10 mm. Imaging features are highly concerning for metastatic disease.  # Patient currently status post multiple lines of therapy -most recently noted to have  progression with new tumors despite bexarotene .    # JULY 17th, 2025- pralatrexate - 15mg /m2 for 3/4 weeks; AUG 15th- added Bexarotene     Mycosis fungoides, lymph nodes of multiple sites (HCC)  05/24/2012 Initial Diagnosis   Mycosis fungoides,  unspecified site (HCC)   04/03/2024 Cancer Staging   Staging form: Mycosis Fungoides and Sezary Syndrome, AJCC 8th Edition - Clinical: Stage IVB (cT4, cN0, pM1, B0) - Signed by Rennie Cindy SAUNDERS, MD on 04/03/2024   04/13/2024 -  Chemotherapy   Patient is on Treatment Plan : NON-HODGKINS LYMPHOMA T-CELL Pralatrexate  q49d        HISTORY OF PRESENTING ILLNESS: Patient ambulating-independently. With wife.   Manuel Cook 78 y.o.  male pleasant patient with a longstanding history of T-cell cutaneous lymphoma/mycosis fungoides status post multiple lines of therapy currently on palliative chemotherapy- pralatrexate  + bexarotene   is here for a follow up.  Rt eye, lt foot swelling. Slight temp 100.2. Fatigue, trouble concentrating. Lt arm, scrtum and waist is worse with lesions.   Wife states pt has stated he wanted to get a gun and shoot himself but pt says he's joking and refused any referral for therapy.   Needs refill on oxycodone , pended  Patient complains of worsening itching.  Patient also noted to have worsening scrotal lesions.   Also the nodular lesions worsening are causing worsening pain. Currently on oxycodone .  Patient did not tolerate long-acting Xtampza -because of mental status changes extreme fatigue.  Patient notes have fair Appetite. 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   Review of Systems  Constitutional:  Positive for malaise/fatigue and weight loss. Negative for chills, diaphoresis and fever.  HENT:  Negative for nosebleeds and sore throat.   Eyes:  Negative for double vision.  Respiratory:  Negative for cough, hemoptysis, sputum production, shortness of breath and wheezing.   Cardiovascular:  Negative for chest pain, palpitations, orthopnea and leg swelling.  Gastrointestinal:  Negative for abdominal pain, blood in stool, constipation, diarrhea, heartburn, melena, nausea and vomiting.  Genitourinary:  Negative for  dysuria, frequency and urgency.  Musculoskeletal:  Negative  for back pain and joint pain.  Skin:  Positive for itching and rash.  Neurological:  Negative for dizziness, tingling, focal weakness, weakness and headaches.  Endo/Heme/Allergies:  Does not bruise/bleed easily.  Psychiatric/Behavioral:  Negative for depression. The patient is not nervous/anxious and does not have insomnia.     MEDICAL HISTORY:  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HCC)    Pneumonia    Sleep apnea     SURGICAL HISTORY: Past Surgical History:  Procedure Laterality Date   ANTERIOR LAT LUMBAR FUSION N/A 04/08/2021   Procedure: Lumbar one-two Lumbar two-three Anterolateral lumbar interbody fusion with posterior percutaneous fixation Lumbar one to Lumbar three, removal of old hardware at Lumbar four-five;  Surgeon: Colon Shove, MD;  Location: [facility] OR;  Service: Neurosurgery;  Laterality: N/A;   BACK SURGERY     BIOPSY  07/20/2023   Procedure: BIOPSY;  Surgeon: Maryruth Ole DASEN, MD;  Location: [facility] ENDOSCOPY;  Service: Endoscopy;;   CARDIAC CATHETERIZATION     COLONOSCOPY WITH PROPOFOL  N/A 12/22/2016   Procedure: COLONOSCOPY WITH PROPOFOL ;  Surgeon: Gladis RAYMOND Mariner, MD;  Location: [facility] ENDOSCOPY;  Service: Endoscopy;  Laterality: N/A;   CORONARY ANGIOPLASTY WITH STENT PLACEMENT     ESOPHAGOGASTRODUODENOSCOPY N/A 09/12/2021   Procedure: ESOPHAGOGASTRODUODENOSCOPY (EGD);  Surgeon: Onita Elspeth Sharper, DO;  Location: [facility] ENDOSCOPY;  Service: Gastroenterology;  Laterality: N/A;   ESOPHAGOGASTRODUODENOSCOPY (EGD) WITH PROPOFOL  N/A 07/20/2023   Procedure: ESOPHAGOGASTRODUODENOSCOPY (EGD) WITH PROPOFOL ;  Surgeon: Maryruth Ole DASEN, MD;  Location: [facility] ENDOSCOPY;  Service: Endoscopy;  Laterality: N/A;   EYE SURGERY     LUMBAR FUSION  10/28/2009   L3-5 PLIF   LUMBAR PERCUTANEOUS PEDICLE SCREW 2 LEVEL N/A  04/08/2021   Procedure: LUMBAR PERCUTANEOUS PEDICLE SCREW LUMBAR ONE TO THREE;  Surgeon: Colon Shove, MD;  Location: [facility] OR;  Service: Neurosurgery;  Laterality: N/A;   Neck fusion  1995   PORTA CATH INSERTION N/A 04/06/2024   Procedure: PORTA CATH INSERTION;  Surgeon: Marea Selinda RAMAN, MD;  Location: [facility] INVASIVE CV LAB;  Service: Cardiovascular;  Laterality: N/A;   TONSILLECTOMY      SOCIAL HISTORY: Social History   Socioeconomic History   Marital status: Married    Spouse name: Not on file   Number of children: Not on file   Years of education: Not on file   Highest education level: Not on file  Occupational History   Not on file  Tobacco Use   Smoking status: Former   Smokeless tobacco: Never   Tobacco comments:    qiut age 66  Vaping Use   Vaping status: Never Used  Substance and Sexual Activity   Alcohol  use: Yes    Alcohol /week: 0.0 standard drinks of alcohol     Comment: social   Drug use: No   Sexual activity: Not on file  Other Topics Concern   Not on file  Social History Narrative   Not on file   Social Drivers of Health   Financial Resource Strain: Low Risk  (01/18/2024)   Received from [facility]   Overall Financial Resource Strain (CARDIA)    Difficulty of Paying Living Expenses: Not hard at all  Food Insecurity: No Food Insecurity (06/23/2024)  Hunger Vital Sign    Worried About Running Out of Food in the Last Year: Never true    Ran Out of Food in the Last Year: Never true  Transportation Needs: No Transportation Needs (06/23/2024)   PRAPARE - Administrator, Civil Service (Medical): No    Lack of Transportation (Non-Medical): No  Physical Activity: Not on file  Stress: No Stress Concern Present (06/18/2022)   [employer] of Occupational Health - Occupational Stress Questionnaire    Feeling of Stress : Not at all  Social Connections: Moderately Integrated (06/23/2024)   Social Connection and Isolation Panel     Frequency of Communication with Friends and Family: More than three times a week    Frequency of Social Gatherings with Friends and Family: Twice a week    Attends Religious Services: More than 4 times per year    Active Member of [employer] or Organizations: No    Attends Banker Meetings: Never    Marital Status: Married  Catering manager Violence: Unknown (06/23/2024)   Humiliation, Afraid, Rape, and Kick questionnaire    Fear of Current or Ex-Partner: No    Emotionally Abused: No    Physically Abused: Not on file    Sexually Abused: No    FAMILY HISTORY: Family History  Problem Relation Age of Onset   Heart attack Father        Brother   Diabetes Mellitus II Brother    Prostate cancer Neg Hx     ALLERGIES:  is allergic to sulfa antibiotics.  MEDICATIONS:  No current facility-administered medications for this visit.   No current outpatient medications on file.   Facility-Administered Medications Ordered in Other Visits  Medication Dose Route Frequency Provider Last Rate Last Admin   acetaminophen  (TYLENOL ) tablet 650 mg  650 mg Oral Q6H PRN Seena Marsa NOVAK, MD   650 mg at 06/23/24 2319   Or   acetaminophen  (TYLENOL ) suppository 650 mg  650 mg Rectal Q6H PRN Seena Marsa NOVAK, MD       atenolol  (TENORMIN ) tablet 25 mg  25 mg Oral Daily Melvin, Alexander B, MD   25 mg at 06/26/24 9073   atorvastatin  (LIPITOR) tablet 20 mg  20 mg Oral Daily Agbata, Tochukwu, MD       bexarotene  (TARGRETIN ) capsule 75 mg  75 mg Oral TID WC Lenon Elsie HERO, RPH   75 mg at 06/26/24 9073   ceFEPIme  (MAXIPIME ) 2 g in sodium chloride  0.9 % 100 mL IVPB  2 g Intravenous Q8H Nazari, Walid A, RPH 200 mL/hr at 06/26/24 0532 2 g at 06/26/24 0532   Chlorhexidine  Gluconate Cloth 2 % PADS 6 each  6 each Topical Daily Agbata, Tochukwu, MD   6 each at 06/26/24 0935   enoxaparin  (LOVENOX ) injection 40 mg  40 mg Subcutaneous Q24H Melvin, Alexander B, MD   40 mg at 06/25/24 1732   feeding  supplement (ENSURE PLUS HIGH PROTEIN) liquid 237 mL  237 mL Oral BID BM Cleatus Delayne GAILS, MD   237 mL at 06/26/24 0934   folic acid  (FOLVITE ) tablet 1 mg  1 mg Oral Daily Agbata, Tochukwu, MD       hydrOXYzine  (ATARAX ) tablet 25 mg  25 mg Oral Q6H PRN Seena Marsa NOVAK, MD   25 mg at 06/24/24 2135   leptospermum manuka honey (MEDIHONEY) paste 1 Application  1 Application Topical Daily Agbata, Tochukwu, MD       liver oil-zinc oxide (DESITIN) 40 %  ointment   Topical BID Agbata, Tochukwu, MD       metroNIDAZOLE (FLAGYL) IVPB 500 mg  500 mg Intravenous Q12H Fayette Bodily, MD 100 mL/hr at 06/26/24 0934 500 mg at 06/26/24 9065   mirtazapine  (REMERON ) tablet 30 mg  30 mg Oral QHS Agbata, Tochukwu, MD       montelukast  (SINGULAIR ) tablet 10 mg  10 mg Oral QHS Melvin, Alexander B, MD   10 mg at 06/25/24 2205   morphine  (PF) 2 MG/ML injection 2 mg  2 mg Intravenous Q4H PRN Agbata, Tochukwu, MD   2 mg at 06/24/24 2136   oxyCODONE  (Oxy IR/ROXICODONE ) immediate release tablet 5-10 mg  5-10 mg Oral Q4H PRN Melvin, Alexander B, MD   5 mg at 06/26/24 0325   pantoprazole  (PROTONIX ) EC tablet 40 mg  40 mg Oral Daily PRN Melvin, Alexander B, MD       polyethylene glycol (MIRALAX  / GLYCOLAX ) packet 17 g  17 g Oral Daily PRN Melvin, Alexander B, MD       polyethylene glycol (MIRALAX  / GLYCOLAX ) packet 17 g  17 g Oral Daily Agbata, Tochukwu, MD   17 g at 06/25/24 1239   pregabalin  (LYRICA ) capsule 100 mg  100 mg Oral BID Melvin, Alexander B, MD   100 mg at 06/26/24 9073   senna-docusate (Senokot-S) tablet 2 tablet  2 tablet Oral QHS Agbata, Tochukwu, MD   2 tablet at 06/25/24 2205   sodium chloride  flush (NS) 0.9 % injection 10-40 mL  10-40 mL Intracatheter Q12H Agbata, Tochukwu, MD   10 mL at 06/26/24 0934   sodium chloride  flush (NS) 0.9 % injection 10-40 mL  10-40 mL Intracatheter PRN Agbata, Tochukwu, MD       sodium chloride  flush (NS) 0.9 % injection 3 mL  3 mL Intravenous Q12H Seena Marsa NOVAK, MD    3 mL at 06/26/24 0934   vancomycin  (VANCOREADY) IVPB 2000 mg/400 mL  2,000 mg Intravenous Q24H Suzann Allean LABOR, RPH   Stopped at 06/25/24 1444    PHYSICAL EXAMINATION:   Vitals:   06/22/24 0901 06/22/24 0929  BP: (!) 156/60 (!) 149/58  Pulse: 85   Resp: 16   Temp: 100.2 F (37.9 C)   SpO2: 98%    Filed Weights   06/22/24 0901  Weight: 191 lb (86.6 kg)    Physical Exam Vitals and nursing note reviewed.  HENT:     Head: Normocephalic and atraumatic.     Mouth/Throat:     Pharynx: Oropharynx is clear.  Eyes:     Extraocular Movements: Extraocular movements intact.     Pupils: Pupils are equal, round, and reactive to light.  Cardiovascular:     Rate and Rhythm: Normal rate and regular rhythm.  Pulmonary:     Comments: Decreased breath sounds bilaterally.  Abdominal:     Palpations: Abdomen is soft.  Musculoskeletal:        General: Normal range of motion.     Cervical back: Normal range of motion.  Skin:    General: Skin is warm.  Neurological:     General: No focal deficit present.     Mental Status: He is alert and oriented to person, place, and time.  Psychiatric:        Behavior: Behavior normal.        Judgment: Judgment normal.              AUG 14th, 2025- BEFORE CYCLE # 2  PRIOR TO CYCLE #-2 days #15-                PRIOR to cycle #3 day-1 [06/08/2024]           LABORATORY DATA:  I have reviewed the data as listed Lab Results  Component Value Date   WBC 5.2 06/26/2024   HGB 10.7 (L) 06/26/2024   HCT 33.3 (L) 06/26/2024   MCV 98.8 06/26/2024   PLT 263 06/26/2024   Recent Labs    06/22/24 0904 06/23/24 1151 06/24/24 0525 06/25/24 0941 06/26/24 0530  NA 135 133* 137  --  138  K 3.6 3.7 3.6  --  3.7  CL 101 98 104  --  106  CO2 25 22 26   --  22  GLUCOSE 107* 106* 95  --  87  BUN 23 15 14   --  14  CREATININE 1.19 1.02 0.84 0.85 0.76  CALCIUM  8.8* 8.7* 8.2*  --  8.2*  GFRNONAA >60 >60  >60 >60 >60  PROT 6.6 6.3* 5.2*  --   --   ALBUMIN 3.1* 2.9* 2.2*  --   --   AST 25 25 18   --   --   ALT 15 15 14   --   --   ALKPHOS 58 50 41  --   --   BILITOT 0.7 1.1 0.9  --   --     RADIOGRAPHIC STUDIES: I have personally reviewed the radiological images as listed and agreed with the findings in the report. DG Chest Portable 1 View Result Date: 06/23/2024 CLINICAL DATA:  Cough, fever, history of mycosis fungoides / T-cell lymphoma EXAM: PORTABLE CHEST 1 VIEW COMPARISON:  10/28/2023, 04/03/2024 FINDINGS: Single frontal view of the chest demonstrates right chest wall port via internal jugular approach, tip overlying superior vena cava. The cardiac silhouette is unremarkable. There are multiple bilateral pulmonary nodules, largest in the right lower lung zone measuring 3.6 cm increased since prior PET scan. No acute airspace disease, effusion, or pneumothorax. No acute bony abnormalities. IMPRESSION: 1. Enlarging pulmonary nodules consistent with progression of known pulmonary metastases. 2. No acute airspace disease. Electronically Signed   By: Ozell Daring M.D.   On: 06/23/2024 14:59      Mycosis fungoides, lymph nodes of multiple sites (HCC) # since 2012-mycosis fungoides -cutaneous T-cell lymphoma-PET scan- JULY 2025-multiple skin lesions and increasing multiple lung nodules concerning for metastatic disease.  Discussed with Dr.Aleskerov- NO lung biopsy.  STAGE IV- JULY 14th- 2025- pralatrexate  [15mg /m2 for 3/4 weeks].05/12/2024- ADDED  bexarotene  225 mg [75 mg 3 pills day along with Pralatrexate ] chemo.   # Unfortunately clinically concerning for progressive disease-with worsening skin lesions-and also scrotal lesions.  Discussed with Dr. Alfonso at [facility] reevaluation at [facility] on September 25th/today as part of the multidisciplinary team.  SEP 2025-peripheral blood flow-cytometry-NEG.  # Proceed with cycle number 3 day 15  chemotherapy. Labs-CBC/chemistries were reviewed with the  patient.  Continue leucovorin  with each pralatrexate  - 25 mg TID; Daily folic acid  supplemental B12.  B12 injection every 6 to 8 weeks.  # Oral mucositis- On LV- on s/p magic mouth wash-   # Skin itching-Clairtin daily- singulair -continue lyrica   75 mg BID; and increased remeron  30 mg at bedtime- continue atarax / prn benadryl  prn-cannot afford emend - [too expensive]  # Iatrogenic central hypothyroidism:  [sec to bexarotene .]-On thyroid  supplementation.   AUG 2025- TSH- WNL; awaiting- lipid panel.   # Prostate - pet scan with increased FDG avidity. PSA normal recently.-Monitor  for now.  # ACP: s/p pallaitive referral.   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   # Constipation- add stool softener/laxative as needed.  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   Weekly chemo- q 2 week- MD; 2 cycles-  b12-9/11- q 6 week  PS # DISPOSITION: # chemo today-  # follow up TBD--Dr.B     Above plan of care was discussed with patient/family in detail.  My contact information was given to the patient/family.      Cindy JONELLE Joe, MD 06/26/2024 12:50 PM

## 2024-06-22 NOTE — Patient Instructions (Signed)
 [facility] - A DEPT OF [place]  Discharge Instructions: Thank you for choosing [facility] to provide your oncology and hematology care.  If you have a lab appointment with the Cancer Center, please go directly to the Cancer Center and check in at the registration area.  Wear comfortable clothing and clothing appropriate for easy access to any Portacath or PICC line.   We strive to give you quality time with your provider. You may need to reschedule your appointment if you arrive late (15 or more minutes).  Arriving late affects you and other patients whose appointments are after yours.  Also, if you miss three or more appointments without notifying the office, you may be dismissed from the clinic at the provider's discretion.      For prescription refill requests, have your pharmacy contact our office and allow 72 hours for refills to be completed.    Today you received the following chemotherapy and/or immunotherapy agents- Folotyn       To help prevent nausea and vomiting after your treatment, we encourage you to take your nausea medication as directed.  BELOW ARE SYMPTOMS THAT SHOULD BE REPORTED IMMEDIATELY: *FEVER GREATER THAN 100.4 F (38 C) OR HIGHER *CHILLS OR SWEATING *NAUSEA AND VOMITING THAT IS NOT CONTROLLED WITH YOUR NAUSEA MEDICATION *UNUSUAL SHORTNESS OF BREATH *UNUSUAL BRUISING OR BLEEDING *URINARY PROBLEMS (pain or burning when urinating, or frequent urination) *BOWEL PROBLEMS (unusual diarrhea, constipation, pain near the anus) TENDERNESS IN MOUTH AND THROAT WITH OR WITHOUT PRESENCE OF ULCERS (sore throat, sores in mouth, or a toothache) UNUSUAL RASH, SWELLING OR PAIN  UNUSUAL VAGINAL DISCHARGE OR ITCHING   Items with * indicate a potential emergency and should be followed up as soon as possible or go to the Emergency Department if any problems should occur.  Please show the CHEMOTHERAPY ALERT CARD or IMMUNOTHERAPY  ALERT CARD at check-in to the Emergency Department and triage nurse.  Should you have questions after your visit or need to cancel or reschedule your appointment, please contact [facility] - A DEPT OF [facility]  [phone] and follow the prompts.  Office hours are 8:00 a.m. to 4:30 p.m. Monday - Friday. Please note that voicemails left after 4:00 p.m. may not be returned until the following business day.  We are closed weekends and major holidays. You have access to a nurse at all times for urgent questions. Please call the main number to the clinic [phone] and follow the prompts.  For any non-urgent questions, you may also contact your provider using MyChart. We now offer e-Visits for anyone 60 and older to request care online for non-urgent symptoms. For details visit [email or web address].   Also download the MyChart app! Go to the app store, search MyChart, open the app, select [place], and log in with your MyChart username and password.

## 2024-06-22 NOTE — Progress Notes (Signed)
 Rt eye, lt foot swelling. Slight temp 100.2. Fatigue, trouble concentrating. Lt arm, scrtum and waist is worse with lesions.  Wife states pt has stated he wanted to get a gun and shoot himself but pt says he's joking and refused any referral for therapy.  Needs refill on oxycodone , pended.

## 2024-06-23 ENCOUNTER — Inpatient Hospital Stay
Admission: EM | Admit: 2024-06-23 | Discharge: 2024-06-29 | DRG: 841 | Disposition: A | Discharge status: Hospice | Source: Ambulatory Visit | Attending: Internal Medicine | Admitting: Internal Medicine

## 2024-06-23 ENCOUNTER — Other Ambulatory Visit: Payer: Self-pay

## 2024-06-23 ENCOUNTER — Emergency Department

## 2024-06-23 ENCOUNTER — Telehealth: Payer: Self-pay | Admitting: *Deleted

## 2024-06-23 ENCOUNTER — Encounter: Payer: Self-pay | Admitting: Emergency Medicine

## 2024-06-23 DIAGNOSIS — L089 Local infection of the skin and subcutaneous tissue, unspecified: Secondary | ICD-10-CM | POA: Diagnosis not present

## 2024-06-23 DIAGNOSIS — C8590 Non-Hodgkin lymphoma, unspecified, unspecified site: Secondary | ICD-10-CM | POA: Diagnosis not present

## 2024-06-23 DIAGNOSIS — N401 Enlarged prostate with lower urinary tract symptoms: Secondary | ICD-10-CM | POA: Diagnosis not present

## 2024-06-23 DIAGNOSIS — Z8572 Personal history of non-Hodgkin lymphomas: Secondary | ICD-10-CM | POA: Diagnosis not present

## 2024-06-23 DIAGNOSIS — C8408 Mycosis fungoides, lymph nodes of multiple sites: Principal | ICD-10-CM | POA: Diagnosis present

## 2024-06-23 DIAGNOSIS — Z515 Encounter for palliative care: Secondary | ICD-10-CM | POA: Diagnosis not present

## 2024-06-23 DIAGNOSIS — R52 Pain, unspecified: Secondary | ICD-10-CM

## 2024-06-23 DIAGNOSIS — Z87891 Personal history of nicotine dependence: Secondary | ICD-10-CM

## 2024-06-23 DIAGNOSIS — R059 Cough, unspecified: Secondary | ICD-10-CM | POA: Diagnosis not present

## 2024-06-23 DIAGNOSIS — Z882 Allergy status to sulfonamides status: Secondary | ICD-10-CM

## 2024-06-23 DIAGNOSIS — L98499 Non-pressure chronic ulcer of skin of other sites with unspecified severity: Secondary | ICD-10-CM | POA: Diagnosis present

## 2024-06-23 DIAGNOSIS — N138 Other obstructive and reflux uropathy: Secondary | ICD-10-CM | POA: Diagnosis present

## 2024-06-23 DIAGNOSIS — B370 Candidal stomatitis: Secondary | ICD-10-CM | POA: Diagnosis not present

## 2024-06-23 DIAGNOSIS — N5089 Other specified disorders of the male genital organs: Secondary | ICD-10-CM | POA: Diagnosis not present

## 2024-06-23 DIAGNOSIS — G894 Chronic pain syndrome: Secondary | ICD-10-CM | POA: Diagnosis present

## 2024-06-23 DIAGNOSIS — T148XXA Other injury of unspecified body region, initial encounter: Secondary | ICD-10-CM | POA: Diagnosis not present

## 2024-06-23 DIAGNOSIS — Z981 Arthrodesis status: Secondary | ICD-10-CM

## 2024-06-23 DIAGNOSIS — E7800 Pure hypercholesterolemia, unspecified: Secondary | ICD-10-CM | POA: Diagnosis present

## 2024-06-23 DIAGNOSIS — L989 Disorder of the skin and subcutaneous tissue, unspecified: Secondary | ICD-10-CM | POA: Diagnosis present

## 2024-06-23 DIAGNOSIS — I10 Essential (primary) hypertension: Secondary | ICD-10-CM | POA: Diagnosis present

## 2024-06-23 DIAGNOSIS — N5082 Scrotal pain: Secondary | ICD-10-CM | POA: Diagnosis not present

## 2024-06-23 DIAGNOSIS — K5900 Constipation, unspecified: Secondary | ICD-10-CM | POA: Diagnosis present

## 2024-06-23 DIAGNOSIS — I2510 Atherosclerotic heart disease of native coronary artery without angina pectoris: Secondary | ICD-10-CM | POA: Diagnosis present

## 2024-06-23 DIAGNOSIS — C8400 Mycosis fungoides, unspecified site: Secondary | ICD-10-CM | POA: Diagnosis not present

## 2024-06-23 DIAGNOSIS — Z79899 Other long term (current) drug therapy: Secondary | ICD-10-CM

## 2024-06-23 DIAGNOSIS — G4733 Obstructive sleep apnea (adult) (pediatric): Secondary | ICD-10-CM | POA: Diagnosis present

## 2024-06-23 DIAGNOSIS — Z79631 Long term (current) use of antimetabolite agent: Secondary | ICD-10-CM

## 2024-06-23 DIAGNOSIS — J90 Pleural effusion, not elsewhere classified: Secondary | ICD-10-CM | POA: Diagnosis not present

## 2024-06-23 DIAGNOSIS — Z955 Presence of coronary angioplasty implant and graft: Secondary | ICD-10-CM

## 2024-06-23 DIAGNOSIS — E785 Hyperlipidemia, unspecified: Secondary | ICD-10-CM | POA: Diagnosis present

## 2024-06-23 DIAGNOSIS — R918 Other nonspecific abnormal finding of lung field: Secondary | ICD-10-CM | POA: Diagnosis not present

## 2024-06-23 DIAGNOSIS — K219 Gastro-esophageal reflux disease without esophagitis: Secondary | ICD-10-CM | POA: Diagnosis present

## 2024-06-23 DIAGNOSIS — R0902 Hypoxemia: Secondary | ICD-10-CM | POA: Diagnosis not present

## 2024-06-23 DIAGNOSIS — D630 Anemia in neoplastic disease: Secondary | ICD-10-CM | POA: Diagnosis present

## 2024-06-23 DIAGNOSIS — B964 Proteus (mirabilis) (morganii) as the cause of diseases classified elsewhere: Secondary | ICD-10-CM | POA: Diagnosis not present

## 2024-06-23 DIAGNOSIS — D849 Immunodeficiency, unspecified: Secondary | ICD-10-CM | POA: Diagnosis not present

## 2024-06-23 DIAGNOSIS — R509 Fever, unspecified: Secondary | ICD-10-CM | POA: Diagnosis not present

## 2024-06-23 DIAGNOSIS — Z833 Family history of diabetes mellitus: Secondary | ICD-10-CM

## 2024-06-23 DIAGNOSIS — R0602 Shortness of breath: Secondary | ICD-10-CM | POA: Diagnosis not present

## 2024-06-23 DIAGNOSIS — Z1152 Encounter for screening for COVID-19: Secondary | ICD-10-CM

## 2024-06-23 DIAGNOSIS — Z79891 Long term (current) use of opiate analgesic: Secondary | ICD-10-CM

## 2024-06-23 DIAGNOSIS — Z8249 Family history of ischemic heart disease and other diseases of the circulatory system: Secondary | ICD-10-CM

## 2024-06-23 LAB — COMPREHENSIVE METABOLIC PANEL WITH GFR
ALT: 15 U/L (ref 0–44)
AST: 25 U/L (ref 15–41)
Albumin: 2.9 g/dL — ABNORMAL LOW (ref 3.5–5.0)
Alkaline Phosphatase: 50 U/L (ref 38–126)
Anion gap: 13 (ref 5–15)
BUN: 15 mg/dL (ref 8–23)
CO2: 22 mmol/L (ref 22–32)
Calcium: 8.7 mg/dL — ABNORMAL LOW (ref 8.9–10.3)
Chloride: 98 mmol/L (ref 98–111)
Creatinine, Ser: 1.02 mg/dL (ref 0.61–1.24)
GFR, Estimated: >60 mL/min (ref >60)
Glucose, Bld: 106 mg/dL — ABNORMAL HIGH (ref 70–99)
Potassium: 3.7 mmol/L (ref 3.5–5.1)
Sodium: 133 mmol/L — ABNORMAL LOW (ref 135–145)
Total Bilirubin: 1.1 mg/dL (ref 0.0–1.2)
Total Protein: 6.3 g/dL — ABNORMAL LOW (ref 6.5–8.1)

## 2024-06-23 LAB — CBC WITH DIFFERENTIAL/PLATELET
Abs Immature Granulocytes: 0.07 K/uL (ref 0.00–0.07)
Basophils Absolute: 0.1 K/uL (ref 0.0–0.1)
Basophils Relative: 1 %
Eosinophils Absolute: 0 K/uL (ref 0.0–0.5)
Eosinophils Relative: 0 %
HCT: 40.9 % (ref 39.0–52.0)
Hemoglobin: 12.7 g/dL — ABNORMAL LOW (ref 13.0–17.0)
Immature Granulocytes: 1 %
Lymphocytes Relative: 10 %
Lymphs Abs: 0.6 K/uL — ABNORMAL LOW (ref 0.7–4.0)
MCH: 31.5 pg (ref 26.0–34.0)
MCHC: 31.1 g/dL (ref 30.0–36.0)
MCV: 101.5 fL — ABNORMAL HIGH (ref 80.0–100.0)
Monocytes Absolute: 0.6 K/uL (ref 0.1–1.0)
Monocytes Relative: 11 %
Neutro Abs: 4.4 K/uL (ref 1.7–7.7)
Neutrophils Relative %: 77 %
Platelets: 262 K/uL (ref 150–400)
RBC: 4.03 MIL/uL — ABNORMAL LOW (ref 4.22–5.81)
RDW: 16.9 % — ABNORMAL HIGH (ref 11.5–15.5)
Smear Review: NORMAL
WBC: 5.7 K/uL (ref 4.0–10.5)
nRBC: 0 % (ref 0.0–0.2)

## 2024-06-23 LAB — PROTIME-INR
INR: 1.1 (ref 0.8–1.2)
Prothrombin Time: 15.2 s (ref 11.4–15.2)

## 2024-06-23 LAB — RESP PANEL BY RT-PCR (RSV, FLU A&B, COVID)  RVPGX2
Influenza A by PCR: NEGATIVE
Influenza B by PCR: NEGATIVE
Resp Syncytial Virus by PCR: NEGATIVE
SARS Coronavirus 2 by RT PCR: NEGATIVE

## 2024-06-23 LAB — LACTIC ACID, PLASMA
Lactic Acid, Venous: 1.2 mmol/L (ref 0.5–1.9)
Lactic Acid, Venous: 1 mmol/L (ref 0.5–1.9)

## 2024-06-23 MED ORDER — MONTELUKAST SODIUM 10 MG PO TABS
10.0000 mg | ORAL_TABLET | Freq: Every day | ORAL | Status: DC
  Administered 2024-06-23 – 2024-06-28 (×6): 10 mg via ORAL
  Filled 2024-06-23 (×6): qty 1

## 2024-06-23 MED ORDER — BEXAROTENE 75 MG PO CAPS: 225.0000 mg | ORAL_CAPSULE | Freq: Every day | ORAL | Status: DC

## 2024-06-23 MED ORDER — PREGABALIN 50 MG PO CAPS
100.0000 mg | ORAL_CAPSULE | Freq: Two times a day (BID) | ORAL | Status: DC
Start: 2024-06-23 — End: 2024-06-29
  Administered 2024-06-23 – 2024-06-29 (×11): 100 mg via ORAL
  Filled 2024-06-23 (×11): qty 2

## 2024-06-23 MED ORDER — OXYCODONE HCL 5 MG PO TABS
5.0000 mg | ORAL_TABLET | ORAL | Status: DC | PRN
  Administered 2024-06-24: 5 mg via ORAL
  Administered 2024-06-24: 10 mg via ORAL
  Administered 2024-06-26 – 2024-06-27 (×4): 5 mg via ORAL
  Administered 2024-06-28: 10 mg via ORAL
  Filled 2024-06-23: qty 1
  Filled 2024-06-23: qty 2
  Filled 2024-06-23: qty 1
  Filled 2024-06-23 (×2): qty 2
  Filled 2024-06-23 (×2): qty 1

## 2024-06-23 MED ORDER — VANCOMYCIN HCL 1750 MG/350ML IV SOLN
1750.0000 mg | Freq: Once | INTRAVENOUS | Status: AC
  Administered 2024-06-23: 1750 mg via INTRAVENOUS
  Filled 2024-06-23: qty 350

## 2024-06-23 MED ORDER — ACETAMINOPHEN 650 MG RE SUPP: 650.0000 mg | Freq: Four times a day (QID) | RECTAL | Status: DC | PRN

## 2024-06-23 MED ORDER — ATENOLOL 25 MG PO TABS
25.0000 mg | ORAL_TABLET | Freq: Every day | ORAL | Status: DC
  Administered 2024-06-24 – 2024-06-29 (×6): 25 mg via ORAL
  Filled 2024-06-23 (×6): qty 1

## 2024-06-23 MED ORDER — HYDROXYZINE HCL 25 MG PO TABS
25.0000 mg | ORAL_TABLET | Freq: Four times a day (QID) | ORAL | Status: DC | PRN
  Administered 2024-06-24: 25 mg via ORAL
  Filled 2024-06-23: qty 1

## 2024-06-23 MED ORDER — ACETAMINOPHEN 325 MG PO TABS
650.0000 mg | ORAL_TABLET | Freq: Four times a day (QID) | ORAL | Status: DC | PRN
  Administered 2024-06-23 – 2024-06-28 (×3): 650 mg via ORAL
  Filled 2024-06-23 (×3): qty 2

## 2024-06-23 MED ORDER — VANCOMYCIN HCL 1500 MG/300ML IV SOLN
1500.0000 mg | INTRAVENOUS | Status: DC
  Filled 2024-06-23: qty 300

## 2024-06-23 MED ORDER — POLYETHYLENE GLYCOL 3350 17 G PO PACK: 17.0000 g | PACK | Freq: Every day | ORAL | Status: DC | PRN

## 2024-06-23 MED ORDER — SODIUM CHLORIDE 0.9 % IV SOLN
2.0000 g | Freq: Three times a day (TID) | INTRAVENOUS | Status: DC
Start: 2024-06-23 — End: 2024-06-26
  Administered 2024-06-24 – 2024-06-26 (×9): 2 g via INTRAVENOUS
  Filled 2024-06-23 (×11): qty 12.5

## 2024-06-23 MED ORDER — SODIUM CHLORIDE 0.9 % IV SOLN
2.0000 g | Freq: Once | INTRAVENOUS | Status: AC
  Administered 2024-06-23: 2 g via INTRAVENOUS
  Filled 2024-06-23: qty 20

## 2024-06-23 MED ORDER — ENSURE PLUS HIGH PROTEIN PO LIQD
237.0000 mL | Freq: Two times a day (BID) | ORAL | Status: DC
  Administered 2024-06-24 – 2024-06-28 (×9): 237 mL via ORAL

## 2024-06-23 MED ORDER — SODIUM CHLORIDE 0.9% FLUSH
3.0000 mL | Freq: Two times a day (BID) | INTRAVENOUS | Status: DC
  Administered 2024-06-24 – 2024-06-29 (×12): 3 mL via INTRAVENOUS

## 2024-06-23 MED ORDER — PANTOPRAZOLE SODIUM 40 MG PO TBEC
40.0000 mg | DELAYED_RELEASE_TABLET | Freq: Every day | ORAL | Status: DC | PRN
  Filled 2024-06-23: qty 1

## 2024-06-23 MED ORDER — ENOXAPARIN SODIUM 40 MG/0.4ML IJ SOSY
40.0000 mg | PREFILLED_SYRINGE | INTRAMUSCULAR | Status: DC
Start: 2024-06-23 — End: 2024-06-29
  Administered 2024-06-23 – 2024-06-28 (×6): 40 mg via SUBCUTANEOUS
  Filled 2024-06-23 (×6): qty 0.4

## 2024-06-23 MED ORDER — OXYCODONE HCL 5 MG PO TABS
5.0000 mg | ORAL_TABLET | ORAL | Status: AC
  Administered 2024-06-23: 5 mg via ORAL
  Filled 2024-06-23: qty 1

## 2024-06-23 NOTE — H&P (Signed)
 History and Physical   ISAM UNREIN FMW:[id number] DOB: Jun 02, 1946 DOA: 06/23/2024  PCP: Sadie Manna, MD   Patient coming from: Home  Chief Complaint: Fever, wounds  HPI: Manuel Cook is a 78 y.o. male with medical history significant of hypertension, hyperlipidemia, BPH, CAD status post stent, OSA, cutaneous T-cell lymphoma, C-spine fusion, lumbar surgery presenting with worsening wounds.  Patient is followed by [facility] wound care nurse due to his chronic wounds from his T-cell lymphoma.  Has a worsening urinalysis.  The patient is becoming infected.  Purulent drainage.  Recommend he present to the ED.  Subjective fevers at home as well as fatigue.  Denies chills, chest pain, shortness of breath, abdominal pain constipation, nausea, vomiting  ED Course: Vitals in the ED notable for blood pressure in the 120s at 170 systolic. Lab workup notable for CMP with sodium 133, glucose 1 6, calcium  8.7 total protein 6.3, albumin 2.9.  CBC with hemoglobin stable at 12.7.  PT and INR normal.  Lactic acid normal with repeat pending.  Respiratory panel for flu COVID and RSV negative.  Urinalysis pending.  C. difficile cultures pending.  Blood cultures pending.  Chest x-ray showed increased pulmonary nodules consistent with progression of known metastatic disease.  Patient received oxycodone , vancomycin , ceftriaxone  in ED.  ED discussed case with ID who recommended cefepime  for now.  Review of Systems: As per HPI otherwise all other systems reviewed and are negative.  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HCC)    Pneumonia    Sleep apnea     Past Surgical History:  Procedure Laterality Date   ANTERIOR LAT LUMBAR FUSION N/A 04/08/2021   Procedure: Lumbar one-two Lumbar two-three Anterolateral lumbar interbody fusion with  posterior percutaneous fixation Lumbar one to Lumbar three, removal of old hardware at Lumbar four-five;  Surgeon: Colon Shove, MD;  Location: [facility] OR;  Service: Neurosurgery;  Laterality: N/A;   BACK SURGERY     BIOPSY  07/20/2023   Procedure: BIOPSY;  Surgeon: Maryruth Ole DASEN, MD;  Location: [facility] ENDOSCOPY;  Service: Endoscopy;;   CARDIAC CATHETERIZATION     COLONOSCOPY WITH PROPOFOL  N/A 12/22/2016   Procedure: COLONOSCOPY WITH PROPOFOL ;  Surgeon: Gladis RAYMOND Mariner, MD;  Location: [facility] ENDOSCOPY;  Service: Endoscopy;  Laterality: N/A;   CORONARY ANGIOPLASTY WITH STENT PLACEMENT     ESOPHAGOGASTRODUODENOSCOPY N/A 09/12/2021   Procedure: ESOPHAGOGASTRODUODENOSCOPY (EGD);  Surgeon: Onita Elspeth Sharper, DO;  Location: [facility] ENDOSCOPY;  Service: Gastroenterology;  Laterality: N/A;   ESOPHAGOGASTRODUODENOSCOPY (EGD) WITH PROPOFOL  N/A 07/20/2023   Procedure: ESOPHAGOGASTRODUODENOSCOPY (EGD) WITH PROPOFOL ;  Surgeon: Maryruth Ole DASEN, MD;  Location: [facility] ENDOSCOPY;  Service: Endoscopy;  Laterality: N/A;   EYE SURGERY     LUMBAR FUSION  10/28/2009   L3-5 PLIF   LUMBAR PERCUTANEOUS PEDICLE SCREW 2 LEVEL N/A 04/08/2021   Procedure: LUMBAR PERCUTANEOUS PEDICLE SCREW LUMBAR ONE TO THREE;  Surgeon: Colon Shove, MD;  Location: [facility] OR;  Service: Neurosurgery;  Laterality: N/A;   Neck fusion  1995   PORTA CATH INSERTION N/A 04/06/2024   Procedure: PORTA CATH INSERTION;  Surgeon: Marea Selinda RAMAN, MD;  Location: [facility] INVASIVE CV LAB;  Service: Cardiovascular;  Laterality: N/A;   TONSILLECTOMY      Social History  reports that he has  quit smoking. He has never used smokeless tobacco. He reports current alcohol  use. He reports that he does not use drugs.  Allergies  Allergen Reactions   Sulfa Antibiotics     Unknown reaction    Family History  Problem Relation Age of Onset   Heart attack Father        Brother   Diabetes Mellitus II Brother    Prostate cancer Neg Hx   Reviewed on  admission  Prior to Admission medications   Medication Sig Start Date End Date Taking? Authorizing Provider  acetaminophen  (TYLENOL ) 500 MG tablet Take 1,000 mg by mouth every 6 (six) hours as needed for moderate pain or headache.    [provider]  amLODipine (NORVASC) 5 MG tablet Take 5 mg by mouth daily.    [provider]  aprepitant  (EMEND ) 125 MG capsule Take 125 mg capsule by mouth on day 1. Then take one 80 mg capsule daily on days 2 and 3. Repeat every 14 days. 05/25/24   Brahmanday, Govinda R, MD  aprepitant  (EMEND ) 80 MG capsule Take 125 mg capsule by mouth on day 1. Then take one 80 mg capsule daily on days 2 and 3. Repeat every 14 days. 05/25/24   Brahmanday, Govinda R, MD  atenolol  (TENORMIN ) 25 MG tablet Take by mouth daily.    [provider]  atorvastatin  (LIPITOR) 20 MG tablet Take 20 mg by mouth daily.    [provider]  augmented betamethasone  dipropionate (DIPROLENE -AF) 0.05 % ointment Apply topically 2 (two) times daily.    [provider]  benzonatate  (TESSALON ) 100 MG capsule Take 2 capsules (200 mg total) by mouth 3 (three) times daily as needed for cough. 04/27/24   Brahmanday, Govinda R, MD  bexarotene  (TARGRETIN ) 75 MG CAPS capsule Take 3 capsules (225 mg total) by mouth daily with breakfast. Take with food. Patient taking differently: Take 225 mg by mouth daily with breakfast. Take with food.HE TAKES 75 MG TID 05/12/24   Brahmanday, Govinda R, MD  clobetasol cream (TEMOVATE) 0.05 % Apply 1 application topically 2 (two) times daily.    [provider]  doxycycline (VIBRAMYCIN) 100 MG capsule Take 100 mg by mouth 2 (two) times daily. 05/26/24   [provider]  folic acid  (FOLVITE ) 1 MG tablet Take 1 tablet (1 mg total) by mouth daily. 03/29/24   Brahmanday, Govinda R, MD  furosemide  (LASIX ) 20 MG tablet Take 2 tablets (40 mg total) by mouth daily for 7 days. Patient taking differently: Take 40 mg by mouth daily.  PRN 10/28/23 06/08/24  Jessup, Charles, MD  hydrOXYzine  (ATARAX ) 25 MG tablet Take 1 tablet (25 mg total) by mouth every 6 (six) hours as needed for itching (may cause drowsiness). 05/17/24   Dasie Tinnie MATSU, NP  leucovorin  (WELLCOVORIN ) 25 MG tablet Take 1 tablet (25 mg total) by mouth 3 (three) times daily. Take Three times a day- Take for 2 consecutive days [a total of 6 doses] beginning 24 hours after each dose of Pralatrexate  chemotherapy. 04/03/24   Brahmanday, Govinda R, MD  lidocaine -prilocaine  (EMLA ) cream Apply 1 Application topically as needed. Apply to port and cover with saran wrap 1-2 hours prior to port access 04/10/24   Brahmanday, Govinda R, MD  magic mouthwash (multi-ingredient) oral suspension Swish and swallow 5-10 mLs by mouth 4 (four) times daily as needed for mouth pain. 06/16/24   Brahmanday, Govinda R, MD  mirtazapine  (REMERON ) 30 MG tablet Take 1 tablet (30 mg total) by mouth  at bedtime. 06/08/24   Brahmanday, Govinda R, MD  montelukast  (SINGULAIR ) 10 MG tablet Take 1 tablet (10 mg total) by mouth at bedtime. 05/11/24   Brahmanday, Govinda R, MD  naloxone  (NARCAN ) nasal spray 4 mg/0.1 mL SPRAY 1 SPRAY INTO ONE NOSTRIL AS DIRECTED FOR OPIOID OVERDOSE (TURN PERSON ON SIDE AFTER DOSE. IF NO RESPONSE IN 2-3 MINUTES OR PERSON RESPONDS BUT RELAPSES, REPEAT USING A NEW SPRAY DEVICE AND SPRAY INTO THE OTHER NOSTRIL. CALL 911 AFTER USE.) * EMERGENCY USE ONLY * 06/05/24   Borders, Fonda SAUNDERS, NP  ondansetron  (ZOFRAN ) 8 MG tablet Take 1 tablet (8 mg total) by mouth every 8 (eight) hours as needed for nausea or vomiting. 03/29/24   Brahmanday, Govinda R, MD  Oxycodone  HCl 10 MG TABS Take 0.5-1 tablets (5-10 mg total) by mouth every 4 (four) hours as needed (pain). 06/22/24   Brahmanday, Govinda R, MD  pantoprazole  (PROTONIX ) 40 MG tablet Take 40 mg by mouth daily as needed (acid reflux). 11/25/20   [provider]  potassium chloride  SA (KLOR-CON  M) 20 MEQ tablet Take 1 tablet (20 mEq total) by  mouth daily for 7 days. Patient taking differently: Take 20 mEq by mouth daily. PRN IF USING THE LASIX  10/28/23 06/08/24  Willo Dunnings, MD  pregabalin  (LYRICA ) 100 MG capsule Take 1 capsule (100 mg total) by mouth 2 (two) times daily. 06/08/24   Borders, Fonda SAUNDERS, NP  prochlorperazine  (COMPAZINE ) 10 MG tablet Take 1 tablet (10 mg total) by mouth every 6 (six) hours as needed for nausea or vomiting. 03/29/24   Rennie Cindy SAUNDERS, MD    Physical Exam: Vitals:   06/23/24 1149 06/23/24 1413 06/23/24 1414 06/23/24 1430  BP:  (!) 172/73  (!) 122/59  Pulse:  85  79  Resp:  18    Temp:  98.3 F (36.8 C)    TempSrc:  Oral    SpO2:  100% 100% 100%  Weight: 82.1 kg     Height: 5' 8 (1.727 m)       Physical Exam Constitutional:      General: He is not in acute distress.    Appearance: Normal appearance.  HENT:     Head: Normocephalic and atraumatic.     Mouth/Throat:     Mouth: Mucous membranes are moist.     Pharynx: Oropharynx is clear.  Eyes:     Extraocular Movements: Extraocular movements intact.     Pupils: Pupils are equal, round, and reactive to light.  Cardiovascular:     Rate and Rhythm: Normal rate and regular rhythm.     Pulses: Normal pulses.     Heart sounds: Normal heart sounds.  Pulmonary:     Effort: Pulmonary effort is normal. No respiratory distress.     Breath sounds: Normal breath sounds.  Abdominal:     General: Bowel sounds are normal. There is no distension.     Palpations: Abdomen is soft.     Tenderness: There is no abdominal tenderness.  Musculoskeletal:        General: No swelling or deformity.  Skin:    General: Skin is warm and dry.     Comments: Scattered skin lesions from cutaneous T-cell lymphoma.  Purulent drainage at right hip.  Neurological:     General: No focal deficit present.     Mental Status: Mental status is at baseline.     Labs on Admission: I have personally reviewed following labs and imaging studies  CBC: Recent Labs  Lab  06/22/24 0904 06/23/24 1151  WBC 6.1 5.7  NEUTROABS 4.0 4.4  HGB 12.3* 12.7*  HCT 37.4* 40.9  MCV 95.2 101.5*  PLT 307 262    Basic Metabolic Panel: Recent Labs  Lab 06/22/24 0904 06/23/24 1151  NA 135 133*  K 3.6 3.7  CL 101 98  CO2 25 22  GLUCOSE 107* 106*  BUN 23 15  CREATININE 1.19 1.02  CALCIUM  8.8* 8.7*    GFR: Estimated Creatinine Clearance: 62.4 mL/min (by C-G formula based on SCr of 1.02 mg/dL).  Liver Function Tests: Recent Labs  Lab 06/22/24 0904 06/23/24 1151  AST 25 25  ALT 15 15  ALKPHOS 58 50  BILITOT 0.7 1.1  PROT 6.6 6.3*  ALBUMIN 3.1* 2.9*    Urine analysis:    Component Value Date/Time   APPEARANCEUR Clear 04/29/2015 1457   GLUCOSEU Negative 04/29/2015 1457   BILIRUBINUR Negative 04/29/2015 1457   PROTEINUR Negative 04/29/2015 1457   NITRITE Negative 04/29/2015 1457   LEUKOCYTESUR Negative 04/29/2015 1457    Radiological Exams on Admission: DG Chest Portable 1 View Result Date: 06/23/2024 CLINICAL DATA:  Cough, fever, history of mycosis fungoides / T-cell lymphoma EXAM: PORTABLE CHEST 1 VIEW COMPARISON:  10/28/2023, 04/03/2024 FINDINGS: Single frontal view of the chest demonstrates right chest wall port via internal jugular approach, tip overlying superior vena cava. The cardiac silhouette is unremarkable. There are multiple bilateral pulmonary nodules, largest in the right lower lung zone measuring 3.6 cm increased since prior PET scan. No acute airspace disease, effusion, or pneumothorax. No acute bony abnormalities. IMPRESSION: 1. Enlarging pulmonary nodules consistent with progression of known pulmonary metastases. 2. No acute airspace disease. Electronically Signed   By: Ozell Daring M.D.   On: 06/23/2024 14:59   EKG: Independently reviewed.  Sinus rhythm rate 70 bpm.  Nonspecific T wave changes.  Baseline artifact.  Assessment/Plan Active Problems:   BPH with obstruction/lower urinary tract symptoms   Atherosclerotic heart  disease of native coronary artery without angina pectoris   Hyperlipidemia   Mycosis fungoides, lymph nodes of multiple sites (HCC)   Presence of coronary angioplasty implant and graft   Benign essential hypertension   OSA (obstructive sleep apnea)   Wound infection > Patient presenting with worsening wound now with purulent drainage and subjective fevers at home.  In the setting of cutaneous T-cell lymphoma lesions. > No leukocytosis but given immunocompromise status and severity of his lesions started on IV antibiotics in the ED and will observe overnight. > Case was discussed with ID in the ED starting with vancomycin  and cefepime  > Chest x-ray with no acute abnormality, urinalysis, superficial culture, blood culture pending. - Monitor on telemetry - Continue with vancomycin , cefepime  - Trend fever curve and WBC - Follow-up cultures - Supportive care  Cutaneous T-cell lymphoma > Follows with oncology.  Receives treatment with pralatrexate  and leucovorin .  Also on bexarotene . > Chest x-ray with changes consistent with progression of known metastatic disease. - Continue bexarotene  - Continue with Advair, hydroxyzine , Singulair   Hypertension - Continue home atenolol  and amlodipine  Hyperlipidemia - Continue home atorvastatin   CAD - Continue home atenolol , atorvastatin   OSA - Not on CPAP  DVT prophylaxis: Lovenox   Code Status:   Full Family Communication:  None on admission Disposition Plan:   Patient is from:  Home  Anticipated DC to:  Home  Anticipated DC date:  1 to 3 days  Anticipated DC barriers: None  Consults called:  Discussed with ID in the ED  Admission status:  Observation, telemetry  Severity of Illness: The appropriate patient status for this patient is OBSERVATION. Observation status is judged to be reasonable and necessary in order to provide the required intensity of service to ensure the patient's safety. The patient's presenting symptoms, physical exam  findings, and initial radiographic and laboratory data in the context of their medical condition is felt to place them at decreased risk for further clinical deterioration. Furthermore, it is anticipated that the patient will be medically stable for discharge from the hospital within 2 midnights of admission.    Marsa KATHEE Scurry MD [facility]  How to contact the [facility] Attending or Consulting provider 7A - 7P or covering provider during after hours 7P -7A, for this patient?   Check the care team in [facility] and look for a) attending/consulting [facility] provider listed and b) the [facility] team listed Log into [email or web address] and use [facility] universal password to access. If you do not have the password, please contact the hospital operator. Locate the [facility] provider you are looking for under [facility] and page to a number that you can be directly reached. If you still have difficulty reaching the provider, please page the [facility] (Director on Call) for the Hospitalists listed on amion for assistance.  06/23/2024, 5:08 PM

## 2024-06-23 NOTE — Telephone Encounter (Signed)
 Mackey called in to say that when she got there and started checking him he had a fever up to 101 he was his heart rate was 80, blood pressure 170/60, and his oxygen level was 88-92 on room air.  She also says he had pitting edema and with all that going on she has told them that going to the ER.

## 2024-06-23 NOTE — Sepsis Progress Note (Signed)
 eLink is following this Code Sepsis.

## 2024-06-23 NOTE — ED Notes (Signed)
 Introduced self to pt. Pt given ice cream per pt request. Pt has call bell in reach. Pt has no further needs or requests at this time.

## 2024-06-23 NOTE — ED Provider Notes (Signed)
 [facility] Provider Note    Event Date/Time   First MD Initiated Contact with Patient 06/23/24 1503     (approximate)   History   Fever   HPI  Manuel Cook is a 78 y.o. male  T-cell cutaneous lymphoma/mycosis fungoides      Reports wound care nurse evaluated him today at the house and sent him for concerns of infection that has developed.  His wife says that they were evaluate by wound care and told to come to the ER for treatment  Patient relates that he has several painful skin wounds chronic due to his cancer.  But 1 over his right hip has started leaking pus and become very sore.  He had a fever yesterday and he thinks some today perhaps as high as 100.4 or 100.2  Fatigue.  No shortness of breath no chest pain.  Denies any pain or wounds over no his buttock or scrotum but very much aware of the 1 on his right thigh hip area  Physical Exam   Triage Vital Signs: ED Triage Vitals  Encounter Vitals Group     BP 06/23/24 1148 124/64     Girls Systolic BP Percentile --      Girls Diastolic BP Percentile --      Boys Systolic BP Percentile --      Boys Diastolic BP Percentile --      Pulse Rate 06/23/24 1148 90     Resp 06/23/24 1148 18     Temp 06/23/24 1148 98.3 F (36.8 C)     Temp Source 06/23/24 1148 Oral     SpO2 06/23/24 1148 99 %     Weight 06/23/24 1149 181 lb (82.1 kg)     Height 06/23/24 1149 5' 8 (1.727 m)     Head Circumference --      Peak Flow --      Pain Score 06/23/24 1149 8     Pain Loc --      Pain Education --      Exclude from Growth Chart --     Most recent vital signs: Vitals:   06/23/24 1414 06/23/24 1430  BP:  (!) 122/59  Pulse:  79  Resp:    Temp:    SpO2: 100% 100%     General: Awake, no distress.  He appears in pain.  Reports the wounds are very painful any movement of his leg on the right causes pain over his right lateral hip where the ulcer is CV:  Good peripheral perfusion.  Normal  tone Resp:  Normal effort.  Clear bilateral Abd:  No distention.  Soft nontender nondistended but very sore if palpating along the areas of rash and chronic skin warm Other:  Right lateral pelvis has a skin wound probably about 3 x 3 cm ulcerated with purulent drainage and foul smell.  Examined perineum and back, multiple skin wounds but these appear to be more chronic and not superinfected.  Scrotum does not appear to be superinfected and the peritoneum does not demonstrate evidence of infection   ED Results / Procedures / Treatments   Labs (all labs ordered are listed, but only abnormal results are displayed) Labs Reviewed  COMPREHENSIVE METABOLIC PANEL WITH GFR - Abnormal; Notable for the following components:      Result Value   Sodium 133 (*)    Glucose, Bld 106 (*)    Calcium  8.7 (*)    Total Protein 6.3 (*)  Albumin 2.9 (*)    All other components within normal limits  CBC WITH DIFFERENTIAL/PLATELET - Abnormal; Notable for the following components:   RBC 4.03 (*)    Hemoglobin 12.7 (*)    MCV 101.5 (*)    RDW 16.9 (*)    Lymphs Abs 0.6 (*)    All other components within normal limits  RESP PANEL BY RT-PCR (RSV, FLU A&B, COVID)  RVPGX2  CULTURE, BLOOD (ROUTINE X 2)  CULTURE, BLOOD (ROUTINE X 2)  AEROBIC CULTURE W GRAM STAIN (SUPERFICIAL SPECIMEN)  LACTIC ACID, PLASMA  PROTIME-INR  URINALYSIS, W/ REFLEX TO CULTURE (INFECTION SUSPECTED)  LACTIC ACID, PLASMA   Labs are reassuring.  Mild anemia normal white count.  EKG     RADIOLOGY    DG Chest Portable 1 View Result Date: 06/23/2024 CLINICAL DATA:  Cough, fever, history of mycosis fungoides / T-cell lymphoma EXAM: PORTABLE CHEST 1 VIEW COMPARISON:  10/28/2023, 04/03/2024 FINDINGS: Single frontal view of the chest demonstrates right chest wall port via internal jugular approach, tip overlying superior vena cava. The cardiac silhouette is unremarkable. There are multiple bilateral pulmonary nodules, largest in the  right lower lung zone measuring 3.6 cm increased since prior PET scan. No acute airspace disease, effusion, or pneumothorax. No acute bony abnormalities. IMPRESSION: 1. Enlarging pulmonary nodules consistent with progression of known pulmonary metastases. 2. No acute airspace disease. Electronically Signed   By: Ozell Daring M.D.   On: 06/23/2024 14:59      PROCEDURES:  Critical Care performed: No  Procedures   MEDICATIONS ORDERED IN ED: Medications  cefTRIAXone  (ROCEPHIN ) 2 g in sodium chloride  0.9 % 100 mL IVPB (2 g Intravenous New Bag/Given 06/23/24 1624)  vancomycin  (VANCOREADY) IVPB 1750 mg/350 mL (has no administration in time range)  oxyCODONE  (Oxy IR/ROXICODONE ) immediate release tablet 5 mg (5 mg Oral Given 06/23/24 1550)     IMPRESSION / MDM / ASSESSMENT AND PLAN / ED COURSE  I reviewed the triage vital signs and the nursing notes.                              Differential diagnosis includes, but is not limited to, complicated skin wound, associated with severe pain.  Referred here evidently by wound care team.  I do not see a chart note from the wound care team but both patient and wife report wound care team nurse came out and saw him today and told him he had to be reporting to the ER.  He is followed by the wound care clinic  Multiple wounds mostly that seem to be chronic, but in the right lateral hip he has 1 that is purulent draining and foul odor.  He has a complex history of infection and does tell me he is already taking a daily antibiotic    Patient's presentation is most consistent with acute complicated illness / injury requiring diagnostic workup.      Clinical Course as of 06/23/24 1648  Fri Jun 23, 2024  1523 Request sent to infectious disease for recommendations around antibiotics given the complexity of the patient's previous history [MQ]    Clinical Course User Index [MQ] Dicky Anes, MD   Patient appears quite uncomfortable, and he has multiple  skin lesions but with this superinfected wound I am concerned based on the description for potential fairly resistant illness.  He also has immune compromise  Will admit to the hospital for further care and treatment.  Case discussed and reviewed with Dr. Fayette, and she is her suggestion is to initiate antibiotics for vancomycin  and ceftriaxone   Consulted with our hospitalist Dr. Seena  FINAL CLINICAL IMPRESSION(S) / ED DIAGNOSES   Final diagnoses:  Wound infection     Rx / DC Orders   ED Discharge Orders     None        Note:  This document was prepared using Dragon voice recognition software and may include unintentional dictation errors.   Dicky Anes, MD 06/23/24 [phone]

## 2024-06-23 NOTE — Consult Note (Signed)
 ED Pharmacy Antibiotic Sign Off An antibiotic consult was received from an ED provider for Vancomycin  per pharmacy dosing for wound infection. A chart review was completed to assess appropriateness.   The following one time order(s) were placed:  Vancomycin  1750mg  IV.  Further antibiotic and/or antibiotic pharmacy consults should be ordered by the admitting provider if indicated.   Thank you for allowing pharmacy to be a part of this patient's care.   Ayeshia Coppin A Amrit Erck, [facility]  Clinical Pharmacist 06/23/24 4:13 PM

## 2024-06-23 NOTE — Sepsis Progress Note (Signed)
Notified provider of need to order antibiotics.   

## 2024-06-23 NOTE — ED Triage Notes (Signed)
 Patient to ED from [facility] for fever and low O2 per home health wound nurse- unsure what temp or O2 was at home. Has chronic wounds all over body.

## 2024-06-23 NOTE — ED Notes (Signed)
 This RN accessed port. Blood return noted. Pt tolerated well.

## 2024-06-23 NOTE — Consult Note (Signed)
 Pharmacy Antibiotic Note  Manuel Cook is a 78 y.o. male admitted on 06/23/2024 with a wound infection.  Pharmacy has been consulted for Vancomycin  and Cefepime  dosing.  Plan: Vancomycin  1750mg  IV x 1 as loading dose, followed by: Vancomycin  1500 mg IV Q 24 hrs. Goal AUC 400-550. Expected AUC: 484.9 Expected Cmin: 10.9 SCr used: 1.02, Vd used: 0.72  Cefepime  2g IV Q8 hours   Height: 5' 8 (172.7 cm) Weight: 82.1 kg (181 lb) IBW/kg (Calculated) : 68.4  Temp (24hrs), Avg:98.3 F (36.8 C), Min:98.3 F (36.8 C), Max:98.3 F (36.8 C)  Recent Labs  Lab 06/22/24 0904 06/23/24 1151 06/23/24 1445 06/23/24 1650  WBC 6.1 5.7  --   --   CREATININE 1.19 1.02  --   --   LATICACIDVEN  --   --  1.2 1.0    Estimated Creatinine Clearance: 62.4 mL/min (by C-G formula based on SCr of 1.02 mg/dL).    Allergies  Allergen Reactions   Sulfa Antibiotics     Unknown reaction    Antimicrobials this admission: Vancomycin  9/26 >>  Cefepime  9/26 >>  Ceftriaxone  9/26 >>  Dose adjustments this admission: N/A  Microbiology results: 9/26 BCx: collected 9/26 wound cx: collected  Thank you for allowing pharmacy to be a part of this patient's care.  Manuel Cook 06/23/2024 5:29 PM

## 2024-06-24 DIAGNOSIS — Z515 Encounter for palliative care: Secondary | ICD-10-CM | POA: Diagnosis not present

## 2024-06-24 DIAGNOSIS — Z1152 Encounter for screening for COVID-19: Secondary | ICD-10-CM | POA: Diagnosis not present

## 2024-06-24 DIAGNOSIS — Z8249 Family history of ischemic heart disease and other diseases of the circulatory system: Secondary | ICD-10-CM | POA: Diagnosis not present

## 2024-06-24 DIAGNOSIS — B964 Proteus (mirabilis) (morganii) as the cause of diseases classified elsewhere: Secondary | ICD-10-CM | POA: Diagnosis not present

## 2024-06-24 DIAGNOSIS — Z79899 Other long term (current) drug therapy: Secondary | ICD-10-CM | POA: Diagnosis not present

## 2024-06-24 DIAGNOSIS — Z882 Allergy status to sulfonamides status: Secondary | ICD-10-CM | POA: Diagnosis not present

## 2024-06-24 DIAGNOSIS — L089 Local infection of the skin and subcutaneous tissue, unspecified: Secondary | ICD-10-CM | POA: Diagnosis not present

## 2024-06-24 DIAGNOSIS — I10 Essential (primary) hypertension: Secondary | ICD-10-CM | POA: Diagnosis not present

## 2024-06-24 DIAGNOSIS — L989 Disorder of the skin and subcutaneous tissue, unspecified: Secondary | ICD-10-CM | POA: Diagnosis not present

## 2024-06-24 DIAGNOSIS — D630 Anemia in neoplastic disease: Secondary | ICD-10-CM | POA: Diagnosis not present

## 2024-06-24 DIAGNOSIS — L98499 Non-pressure chronic ulcer of skin of other sites with unspecified severity: Secondary | ICD-10-CM | POA: Diagnosis not present

## 2024-06-24 DIAGNOSIS — N5089 Other specified disorders of the male genital organs: Secondary | ICD-10-CM

## 2024-06-24 DIAGNOSIS — C8408 Mycosis fungoides, lymph nodes of multiple sites: Secondary | ICD-10-CM | POA: Diagnosis not present

## 2024-06-24 DIAGNOSIS — Z981 Arthrodesis status: Secondary | ICD-10-CM | POA: Diagnosis not present

## 2024-06-24 DIAGNOSIS — G894 Chronic pain syndrome: Secondary | ICD-10-CM | POA: Diagnosis not present

## 2024-06-24 DIAGNOSIS — I2510 Atherosclerotic heart disease of native coronary artery without angina pectoris: Secondary | ICD-10-CM | POA: Diagnosis not present

## 2024-06-24 DIAGNOSIS — E7800 Pure hypercholesterolemia, unspecified: Secondary | ICD-10-CM | POA: Diagnosis not present

## 2024-06-24 DIAGNOSIS — D849 Immunodeficiency, unspecified: Secondary | ICD-10-CM | POA: Diagnosis not present

## 2024-06-24 DIAGNOSIS — N138 Other obstructive and reflux uropathy: Secondary | ICD-10-CM | POA: Diagnosis not present

## 2024-06-24 DIAGNOSIS — N401 Enlarged prostate with lower urinary tract symptoms: Secondary | ICD-10-CM | POA: Diagnosis not present

## 2024-06-24 DIAGNOSIS — Z79891 Long term (current) use of opiate analgesic: Secondary | ICD-10-CM | POA: Diagnosis not present

## 2024-06-24 DIAGNOSIS — K219 Gastro-esophageal reflux disease without esophagitis: Secondary | ICD-10-CM | POA: Diagnosis not present

## 2024-06-24 DIAGNOSIS — R52 Pain, unspecified: Secondary | ICD-10-CM | POA: Diagnosis not present

## 2024-06-24 DIAGNOSIS — Z87891 Personal history of nicotine dependence: Secondary | ICD-10-CM | POA: Diagnosis not present

## 2024-06-24 DIAGNOSIS — N5082 Scrotal pain: Secondary | ICD-10-CM | POA: Diagnosis not present

## 2024-06-24 DIAGNOSIS — Z833 Family history of diabetes mellitus: Secondary | ICD-10-CM | POA: Diagnosis not present

## 2024-06-24 DIAGNOSIS — T148XXA Other injury of unspecified body region, initial encounter: Secondary | ICD-10-CM | POA: Diagnosis present

## 2024-06-24 DIAGNOSIS — G4733 Obstructive sleep apnea (adult) (pediatric): Secondary | ICD-10-CM | POA: Diagnosis not present

## 2024-06-24 DIAGNOSIS — B370 Candidal stomatitis: Secondary | ICD-10-CM | POA: Diagnosis not present

## 2024-06-24 DIAGNOSIS — Z955 Presence of coronary angioplasty implant and graft: Secondary | ICD-10-CM | POA: Diagnosis not present

## 2024-06-24 LAB — COMPREHENSIVE METABOLIC PANEL WITH GFR
ALT: 14 U/L (ref 0–44)
AST: 18 U/L (ref 15–41)
Albumin: 2.2 g/dL — ABNORMAL LOW (ref 3.5–5.0)
Alkaline Phosphatase: 41 U/L (ref 38–126)
Anion gap: 7 (ref 5–15)
BUN: 14 mg/dL (ref 8–23)
CO2: 26 mmol/L (ref 22–32)
Calcium: 8.2 mg/dL — ABNORMAL LOW (ref 8.9–10.3)
Chloride: 104 mmol/L (ref 98–111)
Creatinine, Ser: 0.84 mg/dL (ref 0.61–1.24)
GFR, Estimated: >60 mL/min (ref >60)
Glucose, Bld: 95 mg/dL (ref 70–99)
Potassium: 3.6 mmol/L (ref 3.5–5.1)
Sodium: 137 mmol/L (ref 135–145)
Total Bilirubin: 0.9 mg/dL (ref 0.0–1.2)
Total Protein: 5.2 g/dL — ABNORMAL LOW (ref 6.5–8.1)

## 2024-06-24 LAB — CBC
HCT: 32.8 % — ABNORMAL LOW (ref 39.0–52.0)
Hemoglobin: 10.7 g/dL — ABNORMAL LOW (ref 13.0–17.0)
MCH: 31.2 pg (ref 26.0–34.0)
MCHC: 32.6 g/dL (ref 30.0–36.0)
MCV: 95.6 fL (ref 80.0–100.0)
Platelets: 273 K/uL (ref 150–400)
RBC: 3.43 MIL/uL — ABNORMAL LOW (ref 4.22–5.81)
RDW: 16.7 % — ABNORMAL HIGH (ref 11.5–15.5)
WBC: 4.8 K/uL (ref 4.0–10.5)
nRBC: 0 % (ref 0.0–0.2)

## 2024-06-24 MED ORDER — MORPHINE SULFATE (PF) 2 MG/ML IV SOLN
2.0000 mg | INTRAVENOUS | Status: DC | PRN
Start: 2024-06-24 — End: 2024-06-28
  Administered 2024-06-24 – 2024-06-27 (×3): 2 mg via INTRAVENOUS
  Filled 2024-06-24 (×3): qty 1

## 2024-06-24 MED ORDER — MORPHINE SULFATE (PF) 2 MG/ML IV SOLN
2.0000 mg | Freq: Once | INTRAVENOUS | Status: AC
  Administered 2024-06-24: 2 mg via INTRAVENOUS
  Filled 2024-06-24: qty 1

## 2024-06-24 MED ORDER — VANCOMYCIN HCL 2000 MG/400ML IV SOLN
2000.0000 mg | INTRAVENOUS | Status: DC
  Administered 2024-06-24 – 2024-06-26 (×3): 2000 mg via INTRAVENOUS
  Filled 2024-06-24 (×3): qty 400

## 2024-06-24 MED ORDER — METRONIDAZOLE 500 MG/100ML IV SOLN
500.0000 mg | Freq: Two times a day (BID) | INTRAVENOUS | Status: DC
  Administered 2024-06-24 – 2024-06-26 (×4): 500 mg via INTRAVENOUS
  Filled 2024-06-24 (×6): qty 100

## 2024-06-24 NOTE — Plan of Care (Signed)

## 2024-06-24 NOTE — Consult Note (Signed)
 Pharmacy Antibiotic Note  Manuel Cook is a 78 y.o. male admitted on 06/23/2024 with a wound infection.  Pharmacy has been consulted for Vancomycin  and Cefepime  dosing.  Plan: Scr improved 1.02>> 0.84 Will adjust Vancomycin  from 1500 mg to 2000 mg IV Q 24 hrs. Goal AUC 400-550. Expected AUC: 540 Expected Cmin: 11.0 SCr used: 0.84, Vd used: 0.72  Continue Cefepime  2g IV Q8 hours   Height: 5' 8 (172.7 cm) Weight: 82.1 kg (181 lb) IBW/kg (Calculated) : 68.4  Temp (24hrs), Avg:98.6 F (37 C), Min:97.9 F (36.6 C), Max:100.9 F (38.3 C)  Recent Labs  Lab 06/22/24 0904 06/23/24 1151 06/23/24 1445 06/23/24 1650 06/24/24 0525  WBC 6.1 5.7  --   --  4.8  CREATININE 1.19 1.02  --   --  0.84  LATICACIDVEN  --   --  1.2 1.0  --     Estimated Creatinine Clearance: 75.8 mL/min (by C-G formula based on SCr of 0.84 mg/dL).    Allergies  Allergen Reactions   Sulfa Antibiotics     Unknown reaction    Antimicrobials this admission: Vancomycin  9/26 >>  Cefepime  9/26 >>  Ceftriaxone  9/26 >>  Dose adjustments this admission: 9/27 Vanc 1500 to 2000mg  q24h  Microbiology results: 9/26 BCx: NG<24 hrs 9/26 wound cx: GPC, reincubated  Thank you for allowing pharmacy to be a part of this patient's care.  Allean Haas PharmD Clinical Pharmacist 06/24/2024

## 2024-06-24 NOTE — Progress Notes (Signed)
 Progress Note   Patient: Manuel Cook FMW:[id number] DOB: [birth year] DOA: 06/23/2024     0 DOS: the patient was seen and examined on 06/24/2024   Brief hospital course:  Manuel Cook is a 78 y.o. male with medical history significant of hypertension, hyperlipidemia, BPH, CAD status post stent, OSA, cutaneous T-cell lymphoma, C-spine fusion, lumbar surgery presenting with worsening wounds.   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  Has a worsening urinalysis.  The patient is becoming infected.  Purulent drainage.  Recommend he present to the ED.   Subjective fevers at home as well as fatigue.   Denies chills, chest pain, shortness of breath, abdominal pain constipation, nausea, vomiting   ED Course: Vitals in the ED notable for blood pressure in the 120s at 170 systolic. Lab workup notable for CMP with sodium 133, glucose 1 6, calcium  8.7 total protein 6.3, albumin 2.9.  CBC with hemoglobin stable at 12.7.  PT and INR normal.  Lactic acid normal with repeat pending.  Respiratory panel for flu COVID and RSV negative.  Urinalysis pending.  C. difficile cultures pending.  Blood cultures pending.   Chest x-ray showed increased pulmonary nodules consistent with progression of known metastatic disease.   Patient received oxycodone , vancomycin , ceftriaxone  in ED.  ED discussed case with ID who recommended cefepime  for now.    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 Tmax 100.9 Patient has no leukocytosis but given immunocompromise status and severity of his lesions started on IV antibiotics in the ED  Continue empiric antibiotic therapy with vancomycin  and cefepime  Wound care consult Blood cultures no growth in 24 hours Wound culture is pending ID consultation    Cutaneous T-cell lymphoma Patient currently status post multiple lines of therapy -most recently noted to have  progression with new tumors despite palliative chemotherapy with bexarotene ,leucovorin . and Pralatrexate   Chest x-ray with changes consistent with progression of known metastatic disease. Continue bexarotene  Follow-up with oncology as an outpatient    Hypertension Blood pressure is stable atenolol  and amlodipine    Hyperlipidemia Continue atorvastatin     CAD Continue atenolol  and  atorvastatin    OSA - Not on CPAP      Subjective: Patient is seen and examined at the bedside.  Lying in bed and appears comfortable.  Wife is at the bedside  Physical Exam: Vitals:   06/23/24 1904 06/23/24 2213 06/24/24 0402 06/24/24 0919  BP: (!) 149/45 (!) 119/44 (!) 108/41 (!) 111/47  Pulse: 79 91 74 73  Resp: 18 19 17 16   Temp: 98.1 F (36.7 C) (!) 100.9 F (38.3 C) 97.9 F (36.6 C) 98 F (36.7 C)  TempSrc: Oral     SpO2: 99%  97% 95%  Weight:      Height:       Constitutional:      General: He is not in acute distress.    Appearance: Normal appearance.  HENT:  Head: Normocephalic and atraumatic.     Mouth/Throat:     Mouth: Mucous membranes are moist.     Pharynx: Oropharynx is clear.  Eyes:     Extraocular Movements: Extraocular movements intact.     Pupils: Pupils are equal, round, and reactive to light.  Cardiovascular:     Rate and Rhythm: Normal rate and regular rhythm.     Pulses: Normal pulses.     Heart sounds: Normal heart sounds.  Pulmonary:     Effort: Pulmonary effort is normal. No  respiratory distress.     Breath sounds: Normal breath sounds.  Abdominal:     General: Bowel sounds are normal. There is no distension.     Palpations: Abdomen is soft.     Tenderness: There is no abdominal tenderness.  Musculoskeletal:        General: No swelling or deformity.  Skin:    General: Skin is warm and dry.     Comments: Scattered skin lesions from cutaneous T-cell lymphoma.  Purulent drainage at right hip.  Neurological:     General: No focal deficit present.     Mental Status: Mental status is at baseline.       Data Reviewed: White count 4.8, hemoglobin 10.7, platelet count 273, sodium 137, potassium 3.6, chloride 104, bicarb 26, glucose 95, BUN 14, creatinine 0.84, calcium  8.2, total protein 5.2, albumin 2.2, AST 18, ALT 14, alk phos 41 Labs reviewed  Family Communication: Plan of care discussed with patient and his wife at the bedside.  They verbalized understanding and agree with the plan.  Disposition: Status is: Observation The patient remains OBS appropriate and will d/c before 2 midnights.  Planned Discharge Destination: Home    Time spent: 50 minutes  Author: Aimee Somerset, MD 06/24/2024 12:56 PM  For on call review [email or web address].

## 2024-06-24 NOTE — Consult Note (Signed)
 NAME: Manuel Cook  DOB: 01-29-1946  MRN: [id number]  Date/Time: 06/24/2024 3:56 PM  REQUESTING PROVIDER: Dr. 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 male with a history of Cutaneous T-cell lymphoma, CAD status post stent, BPH, hypertension, hyperlipidemia, OSA, history of C-spine fusion, lumbar surgery Followed by oncology PRALAtrexate  chemo injection which she last got on 06/22/2024 presents from home for wounds on his skin which look infected as per his home nurse He was also having some chills In the ED vitals were 124/64, TEMP98.3, PR 90, RR18 Later in the night had a temperature of 100.9.  Latest Reference Range & Units 06/23/24 11:51  WBC 4.0 - 10.5 K/uL 5.7  Hemoglobin 13.0 - 17.0 g/dL 87.2 (L)  HCT 60.9 - 47.9 % 40.9  Platelets 150 - 400 K/uL 262 [1]  Creatinine 0.61 - 1.24 mg/dL 8.97  Blood culture sent The wound on the right hip was cultured. He was started on vancomycin  and cefepime  I am asked to see the patient for the same. A PET/CT was done on 04/03/2024 shows bilateral round pulmonary nodules of varying sizes with metabolic activity.  Consistent with pulmonary mets.  And then highly hypermetabolic cutaneous lesion involving the scalp trunk and extremities consistent with active mycosis fungoides.  This is an unfortunate gentle man who has been in extreme pain all over for the past year  HE was initially  diagnosed  with mycoses fungoides in 2012 Steroid cream in 2013 NBUVB phototherapy 2014-2016 Methotrexate 2019-2020 Radiation 2400cGY 08/2022 Jan 2025- progression of tumor on the skin 10/2023-01/28/24- gemcitabine  X 4 cycles 03/03/24 Bexarotene  PET SCAN Jan 2025  HE transferred his care to [facility] from [facility] because of proximity in July 2025 and saw Dr.B  July 2025 PET- pulmonary nodules-  PORT placed 04/06/24 Started IV pralatrexate   Past Medical History:  Diagnosis  Date   Acid reflux    Anxiety    Arthritis    CAD (coronary artery disease)    stents   Cancer (HCC)    High cholesterol    Hypertension    Mycosis fungoides (HCC)    Pneumonia    Sleep apnea     Past Surgical History:  Procedure Laterality Date   ANTERIOR LAT LUMBAR FUSION N/A 04/08/2021   Procedure: Lumbar one-two Lumbar two-three Anterolateral lumbar interbody fusion with posterior percutaneous fixation Lumbar one to Lumbar three, removal of old hardware at Lumbar four-five;  Surgeon: Colon Shove, MD;  Location: [facility] OR;  Service: Neurosurgery;  Laterality: N/A;   BACK SURGERY     BIOPSY  07/20/2023   Procedure: BIOPSY;  Surgeon: Maryruth Ole DASEN, MD;  Location: [facility] ENDOSCOPY;  Service: Endoscopy;;   CARDIAC CATHETERIZATION     COLONOSCOPY WITH PROPOFOL  N/A 12/22/2016   Procedure: COLONOSCOPY WITH PROPOFOL ;  Surgeon: Gladis RAYMOND Mariner, MD;  Location: [facility] ENDOSCOPY;  Service: Endoscopy;  Laterality: N/A;   CORONARY ANGIOPLASTY WITH STENT PLACEMENT     ESOPHAGOGASTRODUODENOSCOPY N/A 09/12/2021   Procedure: ESOPHAGOGASTRODUODENOSCOPY (EGD);  Surgeon: Onita Elspeth Sharper, DO;  Location: [facility] ENDOSCOPY;  Service: Gastroenterology;  Laterality: N/A;   ESOPHAGOGASTRODUODENOSCOPY (EGD) WITH PROPOFOL  N/A 07/20/2023   Procedure: ESOPHAGOGASTRODUODENOSCOPY (EGD) WITH PROPOFOL ;  Surgeon: Maryruth Ole DASEN, MD;  Location: [facility] ENDOSCOPY;  Service: Endoscopy;  Laterality: N/A;   EYE SURGERY     LUMBAR FUSION  10/28/2009   L3-5 PLIF   LUMBAR PERCUTANEOUS PEDICLE SCREW 2 LEVEL N/A  04/08/2021   Procedure: LUMBAR PERCUTANEOUS PEDICLE SCREW LUMBAR ONE TO THREE;  Surgeon: Colon Shove, MD;  Location: [facility] OR;  Service: Neurosurgery;  Laterality: N/A;   Neck fusion  1995   PORTA CATH INSERTION N/A 04/06/2024   Procedure: PORTA CATH INSERTION;  Surgeon: Marea Selinda RAMAN, MD;  Location: [facility] INVASIVE CV LAB;  Service: Cardiovascular;  Laterality: N/A;   TONSILLECTOMY      Social History    Socioeconomic History   Marital status: Married    Spouse name: Not on file   Number of children: Not on file   Years of education: Not on file   Highest education level: Not on file  Occupational History   Not on file  Tobacco Use   Smoking status: Former   Smokeless tobacco: Never   Tobacco comments:    qiut age 54  Vaping Use   Vaping status: Never Used  Substance and Sexual Activity   Alcohol  use: Yes    Alcohol /week: 0.0 standard drinks of alcohol     Comment: social   Drug use: No   Sexual activity: Not on file  Other Topics Concern   Not on file  Social History Narrative   Not on file   Social Drivers of Health   Financial Resource Strain: Low Risk  (01/18/2024)   Received from [facility]   Overall Financial Resource Strain (CARDIA)    Difficulty of Paying Living Expenses: Not hard at all  Food Insecurity: No Food Insecurity (06/23/2024)   Hunger Vital Sign    Worried About Running Out of Food in the Last Year: Never true    Ran Out of Food in the Last Year: Never true  Transportation Needs: No Transportation Needs (06/23/2024)   PRAPARE - Administrator, Civil Service (Medical): No    Lack of Transportation (Non-Medical): No  Physical Activity: Not on file  Stress: No Stress Concern Present (06/18/2022)   [employer] of Occupational Health - Occupational Stress Questionnaire    Feeling of Stress : Not at all  Social Connections: Moderately Integrated (06/23/2024)   Social Connection and Isolation Panel    Frequency of Communication with Friends and Family: More than three times a week    Frequency of Social Gatherings with Friends and Family: Twice a week    Attends Religious Services: More than 4 times per year    Active Member of [employer] or Organizations: No    Attends Banker Meetings: Never    Marital Status: Married  Catering manager Violence: Unknown (06/23/2024)   Humiliation, Afraid, Rape, and Kick  questionnaire    Fear of Current or Ex-Partner: No    Emotionally Abused: No    Physically Abused: Not on file    Sexually Abused: No    Family History  Problem Relation Age of Onset   Heart attack Father        Brother   Diabetes Mellitus II Brother    Prostate cancer Neg Hx    Allergies  Allergen Reactions   Sulfa Antibiotics     Unknown reaction   I? Current Facility-Administered Medications  Medication Dose Route Frequency Provider Last Rate Last Admin   acetaminophen  (TYLENOL ) tablet 650 mg  650 mg Oral Q6H PRN Seena Marsa NOVAK, MD   650 mg at 06/23/24 2319   Or   acetaminophen  (TYLENOL ) suppository 650 mg  650 mg Rectal Q6H PRN Seena Marsa NOVAK, MD       atenolol  (TENORMIN )  tablet 25 mg  25 mg Oral Daily Melvin, Alexander B, MD   25 mg at 06/24/24 9052   bexarotene  (TARGRETIN ) capsule 225 mg  225 mg Oral Q breakfast Seena Marsa NOVAK, MD       ceFEPIme  (MAXIPIME ) 2 g in sodium chloride  0.9 % 100 mL IVPB  2 g Intravenous Q8H Nazari, Walid A, RPH 200 mL/hr at 06/24/24 1353 2 g at 06/24/24 1353   enoxaparin  (LOVENOX ) injection 40 mg  40 mg Subcutaneous Q24H Seena Marsa NOVAK, MD   40 mg at 06/23/24 1734   feeding supplement (ENSURE PLUS HIGH PROTEIN) liquid 237 mL  237 mL Oral BID BM Cleatus Delayne GAILS, MD   237 mL at 06/24/24 1415   hydrOXYzine  (ATARAX ) tablet 25 mg  25 mg Oral Q6H PRN Seena Marsa NOVAK, MD       montelukast  (SINGULAIR ) tablet 10 mg  10 mg Oral QHS Melvin, Alexander B, MD   10 mg at 06/23/24 2320   morphine  (PF) 2 MG/ML injection 2 mg  2 mg Intravenous Once Agbata, Tochukwu, MD       oxyCODONE  (Oxy IR/ROXICODONE ) immediate release tablet 5-10 mg  5-10 mg Oral Q4H PRN Seena Marsa NOVAK, MD   10 mg at 06/24/24 1355   pantoprazole  (PROTONIX ) EC tablet 40 mg  40 mg Oral Daily PRN Seena Marsa NOVAK, MD       polyethylene glycol (MIRALAX  / GLYCOLAX ) packet 17 g  17 g Oral Daily PRN Seena Marsa NOVAK, MD       pregabalin  (LYRICA ) capsule 100 mg  100 mg  Oral BID Melvin, Alexander B, MD   100 mg at 06/24/24 9051   sodium chloride  flush (NS) 0.9 % injection 3 mL  3 mL Intravenous Q12H Seena Marsa NOVAK, MD   3 mL at 06/24/24 0948   vancomycin  (VANCOREADY) IVPB 2000 mg/400 mL  2,000 mg Intravenous Q24H Merrill, Kristin A, RPH         Abtx:  Anti-infectives (From admission, onward)    Start     Dose/Rate Route Frequency Ordered Stop   06/24/24 1700  vancomycin  (VANCOREADY) IVPB 1500 mg/300 mL  Status:  Discontinued        1,500 mg 150 mL/hr over 120 Minutes Intravenous Every 24 hours 06/23/24 1735 06/24/24 1028   06/24/24 1300  vancomycin  (VANCOREADY) IVPB 2000 mg/400 mL        2,000 mg 200 mL/hr over 120 Minutes Intravenous Every 24 hours 06/24/24 1028     06/23/24 2200  ceFEPIme  (MAXIPIME ) 2 g in sodium chloride  0.9 % 100 mL IVPB        2 g 200 mL/hr over 30 Minutes Intravenous Every 8 hours 06/23/24 1735     06/23/24 1615  cefTRIAXone  (ROCEPHIN ) 2 g in sodium chloride  0.9 % 100 mL IVPB        2 g 200 mL/hr over 30 Minutes Intravenous  Once 06/23/24 1611 06/23/24 1655   06/23/24 1615  vancomycin  (VANCOREADY) IVPB 1750 mg/350 mL        1,750 mg 175 mL/hr over 120 Minutes Intravenous  Once 06/23/24 1613 06/23/24 1936       REVIEW OF SYSTEMS:  Const: negative fever, negative chills, negative weight loss Eyes: negative diplopia or visual changes, negative eye pain ENT: negative coryza, negative sore throat Resp: negative cough, hemoptysis, dyspnea Cards: negative for chest pain, palpitations, lower extremity edema GU: negative for frequency, dysuria and hematuria GI: Negative for abdominal pain, diarrhea, bleeding, constipation Skin: negative  for rash and pruritus Heme: negative for easy bruising and gum/nose bleeding MS: negative for myalgias, arthralgias, back pain and muscle weakness Neurolo:negative for headaches, dizziness, vertigo, memory problems  Psych: negative for feelings of anxiety, depression  Endocrine: negative  for thyroid , diabetes Allergy/Immunology- negative for any medication or food allergies ? Pertinent Positives include : Objective:  VITALS:  BP (!) 111/47 (BP Location: Left Leg)   Pulse 73   Temp 98 F (36.7 C)   Resp 16   Ht 5' 8 (1.727 m)   Wt 82.1 kg   SpO2 95%   BMI 27.52 kg/m  LDA Foley Central line Other drainage tubes PHYSICAL EXAM:  General: Alert, cooperative, in distress,   Head: Normocephalic, without obvious abnormality, atraumatic. Eyes: rt upper eye lid swollen- ENT Nares normal. No drainage or sinus tenderness. Lips, mucosa, and tongue normal. No Thrush Neck: Supple, symmetrical, no adenopathy, thyroid : non tender no carotid bruit and no JVD. Lungs: b/l air entry Heart: Regular rate and rhythm, no murmur, rub or gallop. Abdomen: Soft, non-tender,not distended. Bowel sounds normal. No masses Extremities: atraumatic, no cyanosis. No edema. No clubbing Skin: extensive involvement of skin and [place] tissue over the whole body with erythematous plaques and nodules  Rt hip, the nodule is ulcerating and foul smelling                       Scrotal edema Scrotal skin redness and tender Neurologic: Grossly non-focal Pertinent Labs Lab Results CBC    Component Value Date/Time   WBC 4.8 06/24/2024 0525   RBC 3.43 (L) 06/24/2024 0525   HGB 10.7 (L) 06/24/2024 0525   HGB 12.3 (L) 06/22/2024 0904   HCT 32.8 (L) 06/24/2024 0525   PLT 273 06/24/2024 0525   PLT 307 06/22/2024 0904   MCV 95.6 06/24/2024 0525   MCH 31.2 06/24/2024 0525   MCHC 32.6 06/24/2024 0525   RDW 16.7 (H) 06/24/2024 0525   LYMPHSABS 0.6 (L) 06/23/2024 1151   MONOABS 0.6 06/23/2024 1151   EOSABS 0.0 06/23/2024 1151   BASOSABS 0.1 06/23/2024 1151       Latest Ref Rng & Units 06/24/2024    5:25 AM 06/23/2024   11:51 AM 06/22/2024    9:04 AM  CMP  Glucose 70 - 99 mg/dL 95  893  892   BUN 8 - 23 mg/dL 14  15  23    Creatinine 0.61 - 1.24 mg/dL 9.15  8.97  8.80   Sodium  135 - 145 mmol/L 137  133  135   Potassium 3.5 - 5.1 mmol/L 3.6  3.7  3.6   Chloride 98 - 111 mmol/L 104  98  101   CO2 22 - 32 mmol/L 26  22  25    Calcium  8.9 - 10.3 mg/dL 8.2  8.7  8.8   Total Protein 6.5 - 8.1 g/dL 5.2  6.3  6.6   Total Bilirubin 0.0 - 1.2 mg/dL 0.9  1.1  0.7   Alkaline Phos 38 - 126 U/L 41  50  58   AST 15 - 41 U/L 18  25  25    ALT 0 - 44 U/L 14  15  15        Microbiology: Recent Results (from the past 240 hours)  Resp panel by RT-PCR (RSV, Flu A&B, Covid) Anterior Nasal Swab     Status: None   Collection Time: 06/23/24  2:23 PM   Specimen: Anterior Nasal Swab  Result Value Ref Range  Status   SARS Coronavirus 2 by RT PCR NEGATIVE NEGATIVE Final    Comment: (NOTE) SARS-CoV-2 target nucleic acids are NOT DETECTED.  The SARS-CoV-2 RNA is generally detectable in upper respiratory specimens during the acute phase of infection. The lowest concentration of SARS-CoV-2 viral copies this assay can detect is 138 copies/mL. A negative result does not preclude SARS-Cov-2 infection and should not be used as the sole basis for treatment or other patient management decisions. A negative result may occur with  improper specimen collection/handling, submission of specimen other than nasopharyngeal swab, presence of viral mutation(s) within the areas targeted by this assay, and inadequate number of viral copies(<138 copies/mL). A negative result must be combined with clinical observations, patient history, and epidemiological information. The expected result is Negative.  Fact Sheet for Patients:  BloggerCourse.com  Fact Sheet for Healthcare Providers:  SeriousBroker.it  This test is no t yet approved or cleared by the [place]  FDA and  has been authorized for detection and/or diagnosis of SARS-CoV-2 by FDA under an Emergency Use Authorization (EUA). This EUA will remain  in effect (meaning this test can be used)  for the duration of the COVID-19 declaration under Section 564(b)(1) of the Act, 21 U.S.C.section 360bbb-3(b)(1), unless the authorization is terminated  or revoked sooner.       Influenza A by PCR NEGATIVE NEGATIVE Final   Influenza B by PCR NEGATIVE NEGATIVE Final    Comment: (NOTE) The Xpert Xpress SARS-CoV-2/FLU/RSV plus assay is intended as an aid in the diagnosis of influenza from Nasopharyngeal swab specimens and should not be used as a sole basis for treatment. Nasal washings and aspirates are unacceptable for Xpert Xpress SARS-CoV-2/FLU/RSV testing.  Fact Sheet for Patients: BloggerCourse.com  Fact Sheet for Healthcare Providers: SeriousBroker.it  This test is not yet approved or cleared by the [place]  FDA and has been authorized for detection and/or diagnosis of SARS-CoV-2 by FDA under an Emergency Use Authorization (EUA). This EUA will remain in effect (meaning this test can be used) for the duration of the COVID-19 declaration under Section 564(b)(1) of the Act, 21 U.S.C. section 360bbb-3(b)(1), unless the authorization is terminated or revoked.     Resp Syncytial Virus by PCR NEGATIVE NEGATIVE Final    Comment: (NOTE) Fact Sheet for Patients: BloggerCourse.com  Fact Sheet for Healthcare Providers: SeriousBroker.it  This test is not yet approved or cleared by the [place]  FDA and has been authorized for detection and/or diagnosis of SARS-CoV-2 by FDA under an Emergency Use Authorization (EUA). This EUA will remain in effect (meaning this test can be used) for the duration of the COVID-19 declaration under Section 564(b)(1) of the Act, 21 U.S.C. section 360bbb-3(b)(1), unless the authorization is terminated or revoked.  Performed at [facility], [address] [place], [place] [address]   Blood Culture (routine x 2)     Status: None  (Preliminary result)   Collection Time: 06/23/24  3:24 PM   Specimen: BLOOD  Result Value Ref Range Status   Specimen Description   Final    BLOOD Blood Culture results may not be optimal due to an inadequate volume of blood received in culture bottles   Special Requests   Final    BOTTLES DRAWN AEROBIC AND ANAEROBIC BLOOD LEFT HAND   Culture   Final    NO GROWTH < 24 HOURS Performed at [facility], [address] [place], [place] [address]    Report Status PENDING  Incomplete  Blood Culture (routine  x 2)     Status: None (Preliminary result)   Collection Time: 06/23/24  3:29 PM   Specimen: BLOOD  Result Value Ref Range Status   Specimen Description   Final    BLOOD Blood Culture results may not be optimal due to an inadequate volume of blood received in culture bottles   Special Requests BOTTLES DRAWN AEROBIC AND ANAEROBIC PORTA CATH  Final   Culture   Final    NO GROWTH < 24 HOURS Performed at [facility], [address] [place], [place] [address]    Report Status PENDING  Incomplete  Aerobic Culture w Gram Stain (superficial specimen)     Status: None (Preliminary result)   Collection Time: 06/23/24  4:10 PM   Specimen: Skin, Cyst/Tag/Debridement; Wound  Result Value Ref Range Status   Specimen Description   Final    SKIN Performed at [facility], [address] [place], [place] [address]    Special Requests   Final    NONE Performed at [facility], [address] [place], [place] [address]    Gram Stain NO WBC SEEN RARE GRAM POSITIVE RODS   Final   Culture   Final    CULTURE REINCUBATED FOR BETTER GROWTH Performed at [facility], [address], [place], [place] [address]    Report Status PENDING  Incomplete    Lines and Device Date on insertion # of days DC  Central line     Foley     ETT       IMAGING RESULTS:  I have personally reviewed the films ?B/l pulmonary  nodules  Impression/Recommendation Extensive skin and Subcutaneous T-cell lymphoma mycosis fungoides  Some of which are ulceratig and has secondary infection Culture has been sent from the hip There is a greenish hue to it Patient is currently on Vanco and cefepime  Will add flagyl  Scotal swelling, erythema- no obvious fourniers Keep a close eye  He is systemically stable had a low-grade fever of 100.9 yesterday White count is normal  Mycoses fungoides On  bexarotene  ( Tagretin) which is a retinoid While on this med needs close monitoring of T4 as it can cause central hypothyroidism and also triglycerides need to be monitored Also on IV PRALAtrexate  which he last got on 9/25  Anemia  Pulmonary nodules- saw Dr.A- bronchoscopy was planned-as OP  Not done yet  CAD status post stent  Hypertension   History of lumbar surgery  Cervical fusion surgery ? This consult involved complex antimicrobial management  ___I have personally spent  -75--minutes involved in face-to-face and non-face-to-face activities for this patient on the day of the visit. Professional time spent includes the following activities: Preparing to see the patient (review of tests), Obtaining and/or reviewing separately obtained history (admission/discharge record), Performing a medically appropriate examination and/or evaluation , Ordering medications/tests/procedures, referring and communicating with other health care professionals, Documenting clinical information in the EMR, Independently interpreting results (not separately reported), Communicating results to the patient/, Counseling and educating the patient/ and Care coordination (not separately reported).    ________________________________________________ Discussed with patient, requesting provider Note:  This document was prepared using Dragon voice recognition software and may include unintentional dictation errors.

## 2024-06-24 NOTE — Plan of Care (Signed)

## 2024-06-25 DIAGNOSIS — T148XXA Other injury of unspecified body region, initial encounter: Secondary | ICD-10-CM | POA: Diagnosis not present

## 2024-06-25 DIAGNOSIS — L089 Local infection of the skin and subcutaneous tissue, unspecified: Secondary | ICD-10-CM | POA: Diagnosis not present

## 2024-06-25 LAB — CREATININE, SERUM
Creatinine, Ser: 0.85 mg/dL (ref 0.61–1.24)
GFR, Estimated: >60 mL/min (ref >60)

## 2024-06-25 MED ORDER — CHLORHEXIDINE GLUCONATE CLOTH 2 % EX PADS
6.0000 | MEDICATED_PAD | Freq: Every day | CUTANEOUS | Status: DC
Start: 2024-06-25 — End: 2024-06-29
  Administered 2024-06-25 – 2024-06-29 (×5): 6 via TOPICAL

## 2024-06-25 MED ORDER — SODIUM CHLORIDE 0.9% FLUSH: 10.0000 mL | INTRAVENOUS | Status: DC | PRN

## 2024-06-25 MED ORDER — SENNOSIDES-DOCUSATE SODIUM 8.6-50 MG PO TABS
2.0000 | ORAL_TABLET | Freq: Every day | ORAL | Status: DC
  Administered 2024-06-25: 2 via ORAL
  Filled 2024-06-25 (×2): qty 2

## 2024-06-25 MED ORDER — SODIUM CHLORIDE 0.9% FLUSH
10.0000 mL | Freq: Two times a day (BID) | INTRAVENOUS | Status: DC
  Administered 2024-06-25 – 2024-06-27 (×6): 10 mL
  Administered 2024-06-28: 20 mL

## 2024-06-25 MED ORDER — POLYETHYLENE GLYCOL 3350 17 G PO PACK
17.0000 g | PACK | Freq: Every day | ORAL | Status: DC
  Administered 2024-06-25: 17 g via ORAL
  Filled 2024-06-25 (×2): qty 1

## 2024-06-25 MED ORDER — BEXAROTENE 75 MG PO CAPS
75.0000 mg | ORAL_CAPSULE | Freq: Three times a day (TID) | ORAL | Status: DC
  Administered 2024-06-25 – 2024-06-28 (×10): 75 mg via ORAL
  Filled 2024-06-25 (×12): qty 1

## 2024-06-25 MED ORDER — BEXAROTENE 75 MG PO CAPS
225.0000 mg | ORAL_CAPSULE | Freq: Every day | ORAL | Status: DC
  Filled 2024-06-25: qty 3

## 2024-06-25 NOTE — Progress Notes (Signed)
 Progress Note   Patient: Manuel Cook FMW:[id number] DOB: 02-Feb-1946 DOA: 06/23/2024     1 DOS: the patient was seen and examined on 06/25/2024   Brief hospital course:  JOUD INGWERSEN is a 78 y.o. male with medical history significant of hypertension, hyperlipidemia, BPH, CAD status post stent, OSA, cutaneous T-cell lymphoma, C-spine fusion, lumbar surgery presenting with worsening wounds.   Patient is followed by [facility] wound care nurse due to his chronic wounds from his T-cell lymphoma.  Has a worsening urinalysis.  The patient is becoming infected.  Purulent drainage.  Recommend he present to the ED.   Subjective fevers at home as well as fatigue.   Denies chills, chest pain, shortness of breath, abdominal pain constipation, nausea, vomiting   ED Course: Vitals in the ED notable for blood pressure in the 120s at 170 systolic. Lab workup notable for CMP with sodium 133, glucose 1 6, calcium  8.7 total protein 6.3, albumin 2.9.  CBC with hemoglobin stable at 12.7.  PT and INR normal.  Lactic acid normal with repeat pending.  Respiratory panel for flu COVID and RSV negative.  Urinalysis pending.  C. difficile cultures pending.  Blood cultures pending.   Chest x-ray showed increased pulmonary nodules consistent with progression of known metastatic disease.   Patient received oxycodone , vancomycin , ceftriaxone  in ED.  ED discussed case with ID who recommended cefepime  for now.       Assessment and Plan:  Active Problems:   BPH with obstruction/lower urinary tract symptoms   Atherosclerotic heart disease of native coronary artery without angina pectoris   Hyperlipidemia   Mycosis fungoides, lymph nodes of multiple sites (HCC)   Presence of coronary angioplasty implant and graft   Benign essential hypertension   OSA (obstructive sleep apnea)   Wound infection Patient with a history of cutaneous T-cell lymphoma/mycosis fungoides with multiple skin lesions He has multiple wounds but  the wound over his right hip has increased purulence and  he had subjective fevers at home with a T max of 100.9 Patient has no leukocytosis but given his immunocompromised status and severity of his lesions started on IV antibiotics in the ED  Continue empiric antibiotic therapy with vancomycin  and cefepime , ID added Flagyl Wound care consult pending Blood cultures are sterile Wound culture is pending Appreciate ID consult     Cutaneous T-cell lymphoma Patient currently status post multiple lines of therapy -most recently noted to have  progression with new tumors despite palliative chemotherapy with bexarotene ,leucovorin . and Pralatrexate   Chest x-ray with changes consistent with progression of known metastatic disease. Continue bexarotene  Follow-up with oncology as an outpatient     Hypertension Blood pressure is stable atenolol  and amlodipine     Hyperlipidemia Continue atorvastatin      CAD Continue atenolol  and  atorvastatin    OSA - Not on CPAP          Subjective: Complains of constipation.  Has not had a bowel movement in several days  Physical Exam: Vitals:   06/24/24 1744 06/24/24 2026 06/25/24 0441 06/25/24 0843  BP: (!) 115/47 (!) 113/37 (!) 111/51 (!) 102/46  Pulse: 80 81 73 72  Resp: 16 17 19 17   Temp: 98.5 F (36.9 C) 98.2 F (36.8 C)  99.4 F (37.4 C)  TempSrc:      SpO2: 96% 97% 95% 94%  Weight:      Height:       Constitutional:      General: He is not in acute distress.  Chronically ill-appearing    Appearance: Normal appearance.  HENT:     Head: Normocephalic and atraumatic.     Mouth/Throat:     Mouth: Mucous membranes are moist.     Pharynx: Oropharynx is clear.  Eyes:     Extraocular Movements: Extraocular movements intact.     Pupils: Pupils are equal, round, and reactive to light.  Cardiovascular:     Rate and Rhythm: Normal rate and regular rhythm.     Pulses: Normal pulses.     Heart sounds: Normal heart sounds.  Pulmonary:      Effort: Pulmonary effort is normal. No respiratory distress.     Breath sounds: Normal breath sounds.  Abdominal:     General: Bowel sounds are normal. There is no distension.     Palpations: Abdomen is soft.     Tenderness: There is no abdominal tenderness.  Musculoskeletal:        General: No swelling or deformity.  Skin:    General: Skin is warm and dry.     Comments: Scattered skin lesions from cutaneous T-cell lymphoma.  Purulent drainage at right hip.  Neurological:     General: No focal deficit present.     Mental Status: Mental status is at baseline.         Data Reviewed: Sodium 137, potassium 3.6, chloride 104, bicarb 26, glucose 95, BUN 14, creatinine 0.84, calcium  8.2, total protein 5.2, albumin 2.2, AST 18, ALT 14, alkaline phosphatase 41, white count 4.8, hemoglobin 10.7, hematocrit 32.8 Labs reviewed  Family Communication: Plan of care discussed with patient in detail  Disposition: Status is: Inpatient Remains inpatient appropriate because: On IV antibiotics for right hip wound infection  Planned Discharge Destination: Home    Time spent: 50  minutes  Author: Aimee Somerset, MD 06/25/2024 11:31 AM  For on call review [email or web address].

## 2024-06-26 ENCOUNTER — Telehealth: Payer: Self-pay | Admitting: Internal Medicine

## 2024-06-26 ENCOUNTER — Encounter: Payer: Self-pay | Admitting: Internal Medicine

## 2024-06-26 DIAGNOSIS — N5082 Scrotal pain: Secondary | ICD-10-CM | POA: Diagnosis not present

## 2024-06-26 DIAGNOSIS — C8408 Mycosis fungoides, lymph nodes of multiple sites: Secondary | ICD-10-CM | POA: Diagnosis not present

## 2024-06-26 DIAGNOSIS — L98499 Non-pressure chronic ulcer of skin of other sites with unspecified severity: Secondary | ICD-10-CM | POA: Diagnosis not present

## 2024-06-26 DIAGNOSIS — T148XXA Other injury of unspecified body region, initial encounter: Secondary | ICD-10-CM | POA: Diagnosis not present

## 2024-06-26 DIAGNOSIS — B370 Candidal stomatitis: Secondary | ICD-10-CM

## 2024-06-26 DIAGNOSIS — B964 Proteus (mirabilis) (morganii) as the cause of diseases classified elsewhere: Secondary | ICD-10-CM | POA: Diagnosis not present

## 2024-06-26 DIAGNOSIS — L089 Local infection of the skin and subcutaneous tissue, unspecified: Secondary | ICD-10-CM | POA: Diagnosis not present

## 2024-06-26 LAB — BASIC METABOLIC PANEL WITH GFR
Anion gap: 10 (ref 5–15)
BUN: 14 mg/dL (ref 8–23)
CO2: 22 mmol/L (ref 22–32)
Calcium: 8.2 mg/dL — ABNORMAL LOW (ref 8.9–10.3)
Chloride: 106 mmol/L (ref 98–111)
Creatinine, Ser: 0.76 mg/dL (ref 0.61–1.24)
GFR, Estimated: >60 mL/min (ref >60)
Glucose, Bld: 87 mg/dL (ref 70–99)
Potassium: 3.7 mmol/L (ref 3.5–5.1)
Sodium: 138 mmol/L (ref 135–145)

## 2024-06-26 LAB — AEROBIC CULTURE W GRAM STAIN (SUPERFICIAL SPECIMEN): Gram Stain: NONE SEEN

## 2024-06-26 LAB — CBC
HCT: 33.3 % — ABNORMAL LOW (ref 39.0–52.0)
Hemoglobin: 10.7 g/dL — ABNORMAL LOW (ref 13.0–17.0)
MCH: 31.8 pg (ref 26.0–34.0)
MCHC: 32.1 g/dL (ref 30.0–36.0)
MCV: 98.8 fL (ref 80.0–100.0)
Platelets: 263 K/uL (ref 150–400)
RBC: 3.37 MIL/uL — ABNORMAL LOW (ref 4.22–5.81)
RDW: 16.5 % — ABNORMAL HIGH (ref 11.5–15.5)
WBC: 5.2 K/uL (ref 4.0–10.5)
nRBC: 0.6 % — ABNORMAL HIGH (ref 0.0–0.2)

## 2024-06-26 MED ORDER — FOLIC ACID 1 MG PO TABS
1.0000 mg | ORAL_TABLET | Freq: Every day | ORAL | Status: DC
  Administered 2024-06-26 – 2024-06-29 (×4): 1 mg via ORAL
  Filled 2024-06-26 (×4): qty 1

## 2024-06-26 MED ORDER — AMOXICILLIN-POT CLAVULANATE 875-125 MG PO TABS
1.0000 | ORAL_TABLET | Freq: Two times a day (BID) | ORAL | Status: DC
  Administered 2024-06-26 – 2024-06-29 (×6): 1 via ORAL
  Filled 2024-06-26 (×6): qty 1

## 2024-06-26 MED ORDER — MIRTAZAPINE 15 MG PO TABS
30.0000 mg | ORAL_TABLET | Freq: Every day | ORAL | Status: DC
  Administered 2024-06-26 – 2024-06-28 (×3): 30 mg via ORAL
  Filled 2024-06-26 (×3): qty 2

## 2024-06-26 MED ORDER — ATORVASTATIN CALCIUM 20 MG PO TABS
20.0000 mg | ORAL_TABLET | Freq: Every day | ORAL | Status: DC
  Administered 2024-06-26 – 2024-06-29 (×4): 20 mg via ORAL
  Filled 2024-06-26 (×4): qty 1

## 2024-06-26 MED ORDER — ZINC OXIDE 40 % EX OINT
TOPICAL_OINTMENT | Freq: Two times a day (BID) | CUTANEOUS | Status: DC
  Filled 2024-06-26: qty 113

## 2024-06-26 MED ORDER — FLUCONAZOLE 100 MG PO TABS
200.0000 mg | ORAL_TABLET | Freq: Once | ORAL | Status: AC
  Administered 2024-06-26: 200 mg via ORAL
  Filled 2024-06-26: qty 2

## 2024-06-26 MED ORDER — FLUCONAZOLE 100 MG PO TABS
100.0000 mg | ORAL_TABLET | Freq: Every day | ORAL | Status: DC
  Administered 2024-06-27 – 2024-06-29 (×3): 100 mg via ORAL
  Filled 2024-06-26 (×3): qty 1

## 2024-06-26 MED ORDER — MEDIHONEY WOUND/BURN DRESSING EX PSTE
1.0000 | PASTE | Freq: Every day | CUTANEOUS | Status: DC
  Administered 2024-06-26 – 2024-06-29 (×4): 1 via TOPICAL
  Filled 2024-06-26: qty 44

## 2024-06-26 NOTE — Consult Note (Signed)
 WOC Nurse Consult Note:  Pt has multiple areas of full thickness wounds related to his disease process.  He is followed by an outpatient wound center  and states they just told me to keep them dry. Refer to photos for entire body locations.  Right hip with previously noted large amt black slough; this was pulled off when the dressing was removed. Now 5X5X.2cm yellow and moist with mod amt tan drainage.  Right flank 6X6X.2cm, yellow and moist Left outer flank 5X5X.2cm, red and moist Back 2X2X.2cm yellow and moist Multiple areas of dry brown scabs without odor, drainage or fluctuance all over body, these may remain open to air.  Multiple red scabbed lesions which are dry and scaley . Right knee 1X1X.2cm, red and moist  Left knee 3X3X.2cm, moist and brown scabbed with small amt tan drainage Right elbow 2X2X.2cm, yellow and moist Right arm 1X1X.2cm, yellow and moist Inner perineum and scrotum are red, moist and macerated with moisture associated skin damage, painful to sit on.   Dressing procedure/placement/frequency: Topical treatment orders provided for bedside nurses to perform as follows:  1. Apply Medihoney Q day to left hip, left and right flank, right elbow, right arm, left knee.  Cover with foam dressings.  Change foam dressings Q 3 days or PRN soiling 2. Apply Desitin to inner perineum and scrotum BID and PRN, leave open to air 3. Foam dressings to any other wounds which are draining. Change foam dressings Q 3 days or PRN soiling  Dry hard brown scab areas can remain open to air.  Please re-consult if further assistance is needed.  Thank-you,  Stephane Fought MSN, RN, CWOCN, CWCN-AP, CNS Contact Mon-Fri 0700-1500: [phone]

## 2024-06-26 NOTE — Progress Notes (Signed)
 Date of Admission:  06/23/2024      ID: Manuel Cook is a 78 y.o. male Principal Problem:   Wound infection Active Problems:   BPH with obstruction/lower urinary tract symptoms   Atherosclerotic heart disease of native coronary artery without angina pectoris   Hyperlipidemia   Mycosis fungoides, lymph nodes of multiple sites (HCC)   Presence of coronary angioplasty implant and graft   Benign essential hypertension   OSA (obstructive sleep apnea)    Subjective: Pt in lot of pain All areas of skin nodules hurt Scrotum painful   Medications:   atenolol   25 mg Oral Daily   atorvastatin   20 mg Oral Daily   bexarotene   75 mg Oral TID WC   Chlorhexidine  Gluconate Cloth  6 each Topical Daily   enoxaparin  (LOVENOX ) injection  40 mg Subcutaneous Q24H   feeding supplement  237 mL Oral BID BM   folic acid   1 mg Oral Daily   leptospermum manuka honey  1 Application Topical Daily   liver oil-zinc oxide   Topical BID   mirtazapine   30 mg Oral QHS   montelukast   10 mg Oral QHS   polyethylene glycol  17 g Oral Daily   pregabalin   100 mg Oral BID   senna-docusate  2 tablet Oral QHS   sodium chloride  flush  10-40 mL Intracatheter Q12H   sodium chloride  flush  3 mL Intravenous Q12H    Objective: Vital signs in last 24 hours: Patient Vitals for the past 24 hrs:  BP Temp Temp src Pulse Resp SpO2  06/26/24 0901 129/70 -- -- 86 16 98 %  06/26/24 0307 (!) 153/106 98 F (36.7 C) -- (!) 102 16 90 %  06/25/24 2008 (!) 99/52 98.9 F (37.2 C) Oral 84 16 96 %  06/25/24 1520 (!) 144/76 97.6 F (36.4 C) -- (!) 109 17 --      PHYSICAL EXAM:  General: Alert, cooperative, some distress on moving , appears stated age.  Head: Normocephalic, without obvious abnormality, atraumatic. Eyes: rt eye lid swollen ENT Nares normal. No drainage or sinus tenderness. Oral cavity- thrush Neck: Supple, symmetrical, no adenopathy, thyroid : non tender no carotid bruit and no JVD. Back: No CVA  tenderness. Lungs: b/l air entry Heart: Regular rate and rhythm, no murmur, rub or gallop. Abdomen: Soft, non-tender,not distended. Bowel sounds normal. No masses Extremities: all over his body- trunk, limbs - nodular and plaque like lesions- some of them ulcerating Scrotal skin erythematous  Skin: No rashes or lesions. Or bruising Lymph: Cervical, supraclavicular normal. Neurologic: Grossly non-focal  Lab Results    Latest Ref Rng & Units 06/26/2024    5:30 AM 06/24/2024    5:25 AM 06/23/2024   11:51 AM  CBC  WBC 4.0 - 10.5 K/uL 5.2  4.8  5.7   Hemoglobin 13.0 - 17.0 g/dL 89.2  89.2  87.2   Hematocrit 39.0 - 52.0 % 33.3  32.8  40.9   Platelets 150 - 400 K/uL 263  273  262        Latest Ref Rng & Units 06/26/2024    5:30 AM 06/25/2024    9:41 AM 06/24/2024    5:25 AM  CMP  Glucose 70 - 99 mg/dL 87   95   BUN 8 - 23 mg/dL 14   14   Creatinine 9.38 - 1.24 mg/dL 9.23  9.14  9.15   Sodium 135 - 145 mmol/L 138   137   Potassium 3.5 -  5.1 mmol/L 3.7   3.6   Chloride 98 - 111 mmol/L 106   104   CO2 22 - 32 mmol/L 22   26   Calcium  8.9 - 10.3 mg/dL 8.2   8.2   Total Protein 6.5 - 8.1 g/dL   5.2   Total Bilirubin 0.0 - 1.2 mg/dL   0.9   Alkaline Phos 38 - 126 U/L   41   AST 15 - 41 U/L   18   ALT 0 - 44 U/L   14       Microbiology: WC from rt hip- proteus Coag neg staph ( not aureus or lugdenensis) Studies/Results: No results found.   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  Some of which are ulcerating and a secondary infection Culture taken from the right hip ulcerating wound was Proteus It also had other coag negative staph which were not IDed by the lab But there was no MRSA or Pseudomonas or Staph aureus Patient is currently on vancomycin  and cefepime  and Flagyl Will change that to p.o. Augmentin The role for antibiotic is limited here He needs Derm care for management of these ulcerating lesions He is in extreme pain He  needs to be followed by dermatology.  Scrotal skin pain and tenderness Will start fluconazole for 5 d  Oral thrush Will start fluconazole for 5 days  Mycosis fungoides On bexarotene  which is a retinoid.  While on this med needs close monitoring of T4 As it can cause central hypothyroidism and also triglyceridemia  Anemia  Pulmonary nodules.  Was evaluated by pulmonologist as outpatient and a bronchoscopy was planned but has not been done yet  CAD status post stent   Hypertension  History of lumbar surgery  Cervical fusion surgery  Discussed the management with the patient Discussed the management with hospitalist.  Does not much to do from ID perspective Can do Augmentin for a week and fluconazole for a week.  ID will sign off tomorrow.

## 2024-06-26 NOTE — Plan of Care (Signed)

## 2024-06-26 NOTE — Telephone Encounter (Signed)
 I spoke to Dr. Rudolpho unfortunately limited options-given the significant worsening of his nodular lesions of his mycosis fungoides.  Options include Bendamustine [not on NCCN]; also Doxil [on NCCN].  MAY 29th 2025- Nuc Med- EF- 65%  # Options include Bendamustine [not on NCCN]; also Doxil [on NCCN]-however other options include hospice.  Pt currently in hospital- will follow up- GB

## 2024-06-26 NOTE — Progress Notes (Signed)
 Progress Note   Patient: Manuel Cook FMW:[id number] DOB: Jun 16, 1946 DOA: 06/23/2024     2 DOS: the patient was seen and examined on 06/26/2024   Brief hospital course:  MAKALE PINDELL is a 78 y.o. male with medical history significant of hypertension, hyperlipidemia, BPH, CAD status post stent, OSA, cutaneous T-cell lymphoma, C-spine fusion, lumbar surgery presenting with worsening wounds.   Patient is followed by [facility] wound care nurse due to his chronic wounds from his T-cell lymphoma.  Has a worsening urinalysis.  The patient is becoming infected.  Purulent drainage.  Recommend he present to the ED.   Subjective fevers at home as well as fatigue.   Denies chills, chest pain, shortness of breath, abdominal pain constipation, nausea, vomiting   ED Course: Vitals in the ED notable for blood pressure in the 120s at 170 systolic. Lab workup notable for CMP with sodium 133, glucose 1 6, calcium  8.7 total protein 6.3, albumin 2.9.  CBC with hemoglobin stable at 12.7.  PT and INR normal.  Lactic acid normal with repeat pending.  Respiratory panel for flu COVID and RSV negative.  Urinalysis pending.  C. difficile cultures pending.  Blood cultures pending.   Chest x-ray showed increased pulmonary nodules consistent with progression of known metastatic disease.   Patient received oxycodone , vancomycin , ceftriaxone  in ED.  ED discussed case with ID who recommended cefepime  for now.  Assessment and Plan:  Active Problems:   BPH with obstruction/lower urinary tract symptoms   Atherosclerotic heart disease of native coronary artery without angina pectoris   Hyperlipidemia   Mycosis fungoides, lymph nodes of multiple sites (HCC)   Presence of coronary angioplasty implant and graft   Benign essential hypertension   OSA (obstructive sleep apnea)   Wound infection Patient with a history of cutaneous T-cell lymphoma/mycosis fungoides with multiple skin lesions He has multiple wounds but the wound  over his right hip has increased purulence and he had subjective fevers at home with a T max of 100.9 Patient has no leukocytosis but given his immunocompromised status and severity of his lesions started on IV antibiotics in the ED  Continue empiric antibiotic therapy with cefepime  and Flagyl recommended by ID will discontinue vancomycin  Appreciate wound care input, recommendations pending Blood cultures are sterile Wound culture yields Proteus Mirabillis.  Sensitivity pending Appreciate ID consult     Cutaneous T-cell lymphoma Patient currently status post multiple lines of therapy -most recently noted to have  progression with new tumors despite palliative chemotherapy with bexarotene ,leucovorin . and Pralatrexate   Chest x-ray with changes consistent with progression of known metastatic disease. Continue bexarotene  Follow-up with oncology as an outpatient     Hypertension Blood pressure is stable atenolol  and amlodipine     Hyperlipidemia Continue atorvastatin      CAD Continue atenolol  and  atorvastatin    OSA - Not on CPAP  Anemia of chronic disease H&H is stable     Subjective: No new complaints.  Had a bowel movement  Physical Exam: Vitals:   06/25/24 1520 06/25/24 2008 06/26/24 0307 06/26/24 0901  BP: (!) 144/76 (!) 99/52 (!) 153/106 129/70  Pulse: (!) 109 84 (!) 102 86  Resp: 17 16 16 16   Temp: 97.6 F (36.4 C) 98.9 F (37.2 C) 98 F (36.7 C)   TempSrc:  Oral    SpO2:  96% 90% 98%  Weight:      Height:       General: He is not in acute distress.  Chronically ill-appearing  Appearance: Normal appearance.  HENT:     Head: Normocephalic and atraumatic.     Mouth/Throat:     Mouth: Mucous membranes are moist.     Pharynx: Oropharynx is clear.  Eyes:     Extraocular Movements: Extraocular movements intact.     Pupils: Pupils are equal, round, and reactive to light.  Cardiovascular:     Rate and Rhythm: Normal rate and regular rhythm.     Pulses:  Normal pulses.     Heart sounds: Normal heart sounds.  Pulmonary:     Effort: Pulmonary effort is normal. No respiratory distress.     Breath sounds: Normal breath sounds.  Abdominal:     General: Bowel sounds are normal. There is no distension.     Palpations: Abdomen is soft.     Tenderness: There is no abdominal tenderness.  Musculoskeletal:        General: No swelling or deformity.  Skin:    General: Skin is warm and dry.     Comments: Scattered skin lesions from cutaneous T-cell lymphoma.  Purulent drainage at right hip.  Neurological:     General: No focal deficit present.     Mental Status: Mental status is at baseline.          Data Reviewed: Sodium 138, potassium 3.7, chloride 106, bicarb 22, glucose 87, BUN 14, creatinine 0.76, calcium  8.2, white count 5.2, hemoglobin 10.7, hematocrit 33.3, platelet count 263 Labs reviewed  Family Communication: Plan of care discussed with patient in detail.  He verbalizes understanding and agrees to the plan  Disposition: Status is: Inpatient Remains inpatient appropriate because: Awaiting results of sensitivity.  On IV antibiotics  Planned Discharge Destination: Home with Home Health    Time spent: 50 minutes  Author: Aimee Somerset, MD 06/26/2024 10:55 AM  For on call review [email or web address].

## 2024-06-26 NOTE — Care Management Important Message (Signed)
 Important Message  Patient Details  Name: Manuel Cook MRN: [id number] Date of Birth: 17-Jul-1946   Important Message Given:  Yes - Medicare IM     [name] 06/26/2024, 4:03 PM

## 2024-06-27 ENCOUNTER — Inpatient Hospital Stay

## 2024-06-27 DIAGNOSIS — R52 Pain, unspecified: Secondary | ICD-10-CM

## 2024-06-27 DIAGNOSIS — T148XXA Other injury of unspecified body region, initial encounter: Secondary | ICD-10-CM

## 2024-06-27 DIAGNOSIS — Z515 Encounter for palliative care: Secondary | ICD-10-CM | POA: Diagnosis not present

## 2024-06-27 DIAGNOSIS — N138 Other obstructive and reflux uropathy: Secondary | ICD-10-CM

## 2024-06-27 DIAGNOSIS — N401 Enlarged prostate with lower urinary tract symptoms: Secondary | ICD-10-CM

## 2024-06-27 DIAGNOSIS — L089 Local infection of the skin and subcutaneous tissue, unspecified: Secondary | ICD-10-CM

## 2024-06-27 DIAGNOSIS — C8408 Mycosis fungoides, lymph nodes of multiple sites: Secondary | ICD-10-CM | POA: Diagnosis not present

## 2024-06-27 MED ORDER — MORPHINE SULFATE ER 15 MG PO TBCR
15.0000 mg | EXTENDED_RELEASE_TABLET | Freq: Two times a day (BID) | ORAL | Status: DC
  Administered 2024-06-27 – 2024-06-29 (×5): 15 mg via ORAL
  Filled 2024-06-27 (×5): qty 1

## 2024-06-27 MED ORDER — CALCIUM CARBONATE ANTACID 500 MG PO CHEW: 1.0000 | CHEWABLE_TABLET | Freq: Three times a day (TID) | ORAL | Status: DC | PRN

## 2024-06-27 MED ORDER — TRIAMCINOLONE ACETONIDE 0.1 % EX CREA
TOPICAL_CREAM | Freq: Two times a day (BID) | CUTANEOUS | Status: DC
  Filled 2024-06-27: qty 15

## 2024-06-27 NOTE — Plan of Care (Signed)

## 2024-06-27 NOTE — Plan of Care (Signed)

## 2024-06-27 NOTE — Consult Note (Signed)
 [facility] CONSULT NOTE  Patient Care Team: Sadie Manna, MD as PCP - General (Internal Medicine) Rennie Cindy SAUNDERS, MD as Consulting Physician (Oncology)  CHIEF COMPLAINTS/PURPOSE OF CONSULTATION: Mycosis fungoides.   Oncology History Overview Note  Diagnosis: Mycosis fungoides with minimal CD30+ (less than 1%) on 06/2022 and 09/2023 biopsy, CD5+ in 50% Date of Diagnosis: 2012 Stage: T1bN0M0B0 (stage 1b) at diagnosis 07/20/22 and 10/14/23- T3N0M0B0 (stage IIB) but in 2025 he had multiple tumors coming up so disease activity had clearly changed but PET scan negative for extracutaneous disease  Regimen:  Steroid creams since 2013 [facility] phototherapy started 2014-09/2014 Methotrexate 20mg /week 2019-2020 and then not see by derm for several years until10/2023 Radiation: 2400 cGy to his right lower arm lesions and left arm lesion. 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 No convincing evidence of nodal or visceral involvement. # MAY 2025- CT chest- IMPRESSION: 1. Numerous new bilateral pulmonary nodules, measuring up to 10 mm. Imaging features are highly concerning for metastatic disease.  # Patient currently status post multiple lines of therapy -most recently noted to have  progression with new tumors despite bexarotene .    # JULY 17th, 2025- pralatrexate - 15mg /m2 for 3/4 weeks; AUG 15th- added Bexarotene     Mycosis fungoides, lymph nodes of multiple sites (HCC)  05/24/2012 Initial Diagnosis   Mycosis fungoides,  unspecified site (HCC)   04/03/2024 Cancer Staging   Staging form: Mycosis Fungoides and Sezary Syndrome, AJCC 8th Edition - Clinical: Stage IVB (cT4, cN0, pM1, B0) - Signed by Rennie Cindy SAUNDERS, MD on 04/03/2024   04/13/2024 -  Chemotherapy   Patient is on Treatment Plan : NON-HODGKINS LYMPHOMA T-CELL Pralatrexate  [id number]       HISTORY OF PRESENTING ILLNESS: Patient resting in the bed.    Manuel Cook 78 y.o.  male pleasant patient with a history of stage IV mycosis fungoides-currently admitted to hospital for fevers/and wound infection.   Patient is currently undergoing chemotherapy with  Pralatrexate  plus bexarotene -for the last 2 to 3 months.  . Unfortunately patient noted to have progression of disease clinically.   Patient is currently admitted to hospital for fevers and also wound infection.  He is currently on broad-spectrum antibiotics.   Patient complains of pain in the scrotal area and also in the arms-in the back.  Review of Systems  Constitutional:  Positive for fever and malaise/fatigue. Negative for chills, diaphoresis and weight loss.  HENT:  Negative for nosebleeds and sore throat.   Eyes:  Negative for double vision.  Respiratory:  Negative for cough, hemoptysis, sputum production, shortness of breath and wheezing.   Cardiovascular:  Negative for chest pain, palpitations, orthopnea and leg swelling.  Gastrointestinal:  Negative for abdominal pain, blood in stool, constipation, diarrhea, heartburn, melena, nausea and vomiting.  Genitourinary:  Negative for dysuria, frequency and urgency.  Musculoskeletal:  Positive for back pain and joint pain.  Skin:  Positive for itching and rash.  Neurological:  Negative for dizziness, tingling, focal weakness, weakness and headaches.  Endo/Heme/Allergies:  Bruises/bleeds easily.  Psychiatric/Behavioral:  Negative for depression. The patient is not nervous/anxious and does not  have insomnia.     MEDICAL HISTORY:  Past Medical  History:  Diagnosis Date   Acid reflux    Anxiety    Arthritis    CAD (coronary artery disease)    stents   Cancer (HCC)    High cholesterol    Hypertension    Mycosis fungoides (HCC)    Pneumonia    Sleep apnea     SURGICAL HISTORY: Past Surgical History:  Procedure Laterality Date   ANTERIOR LAT LUMBAR FUSION N/A 04/08/2021   Procedure: Lumbar one-two Lumbar two-three Anterolateral lumbar interbody fusion with posterior percutaneous fixation Lumbar one to Lumbar three, removal of old hardware at Lumbar four-five;  Surgeon: Colon Shove, MD;  Location: [facility] OR;  Service: Neurosurgery;  Laterality: N/A;   BACK SURGERY     BIOPSY  07/20/2023   Procedure: BIOPSY;  Surgeon: Maryruth Ole DASEN, MD;  Location: [facility] ENDOSCOPY;  Service: Endoscopy;;   CARDIAC CATHETERIZATION     COLONOSCOPY WITH PROPOFOL  N/A 12/22/2016   Procedure: COLONOSCOPY WITH PROPOFOL ;  Surgeon: Gladis RAYMOND Mariner, MD;  Location: [facility] ENDOSCOPY;  Service: Endoscopy;  Laterality: N/A;   CORONARY ANGIOPLASTY WITH STENT PLACEMENT     ESOPHAGOGASTRODUODENOSCOPY N/A 09/12/2021   Procedure: ESOPHAGOGASTRODUODENOSCOPY (EGD);  Surgeon: Onita Elspeth Sharper, DO;  Location: [facility] ENDOSCOPY;  Service: Gastroenterology;  Laterality: N/A;   ESOPHAGOGASTRODUODENOSCOPY (EGD) WITH PROPOFOL  N/A 07/20/2023   Procedure: ESOPHAGOGASTRODUODENOSCOPY (EGD) WITH PROPOFOL ;  Surgeon: Maryruth Ole DASEN, MD;  Location: [facility] ENDOSCOPY;  Service: Endoscopy;  Laterality: N/A;   EYE SURGERY     LUMBAR FUSION  10/28/2009   L3-5 PLIF   LUMBAR PERCUTANEOUS PEDICLE SCREW 2 LEVEL N/A 04/08/2021   Procedure: LUMBAR PERCUTANEOUS PEDICLE SCREW LUMBAR ONE TO THREE;  Surgeon: Colon Shove, MD;  Location: [facility] OR;  Service: Neurosurgery;  Laterality: N/A;   Neck fusion  1995   PORTA CATH INSERTION N/A 04/06/2024   Procedure: PORTA CATH INSERTION;  Surgeon: Marea Selinda RAMAN, MD;  Location: [facility] INVASIVE CV LAB;  Service: Cardiovascular;  Laterality: N/A;    TONSILLECTOMY      SOCIAL HISTORY: Social History   Socioeconomic History   Marital status: Married    Spouse name: Not on file   Number of children: Not on file   Years of education: Not on file   Highest education level: Not on file  Occupational History   Not on file  Tobacco Use   Smoking status: Former   Smokeless tobacco: Never   Tobacco comments:    qiut age 32  Vaping Use   Vaping status: Never Used  Substance and Sexual Activity   Alcohol  use: Yes    Alcohol /week: 0.0 standard drinks of alcohol     Comment: social   Drug use: No   Sexual activity: Not on file  Other Topics Concern   Not on file  Social History Narrative   Not on file   Social Drivers of Health   Financial Resource Strain: Low Risk  (01/18/2024)   Received from [facility]   Overall Financial Resource Strain (CARDIA)    Difficulty of Paying Living Expenses: Not hard at all  Food Insecurity: No Food Insecurity (06/23/2024)   Hunger Vital Sign    Worried About Running Out of Food in the Last Year: Never true    Ran Out of Food in the Last Year: Never true  Transportation Needs: No Transportation Needs (06/23/2024)   PRAPARE - Administrator, Civil Service (Medical): No  Lack of Transportation (Non-Medical): No  Physical Activity: Not on file  Stress: No Stress Concern Present (06/18/2022)   [employer] of Occupational Health - Occupational Stress Questionnaire    Feeling of Stress : Not at all  Social Connections: Moderately Integrated (06/23/2024)   Social Connection and Isolation Panel    Frequency of Communication with Friends and Family: More than three times a week    Frequency of Social Gatherings with Friends and Family: Twice a week    Attends Religious Services: More than 4 times per year    Active Member of [employer] or Organizations: No    Attends Banker Meetings: Never    Marital Status: Married  Catering manager Violence: Unknown  (06/23/2024)   Humiliation, Afraid, Rape, and Kick questionnaire    Fear of Current or Ex-Partner: No    Emotionally Abused: No    Physically Abused: Not on file    Sexually Abused: No    FAMILY HISTORY: Family History  Problem Relation Age of Onset   Heart attack Father        Brother   Diabetes Mellitus II Brother    Prostate cancer Neg Hx     ALLERGIES:  is allergic to sulfa antibiotics.  MEDICATIONS:  Current Facility-Administered Medications  Medication Dose Route Frequency Provider Last Rate Last Admin   acetaminophen  (TYLENOL ) tablet 650 mg  650 mg Oral Q6H PRN Melvin, Alexander B, MD   650 mg at 06/26/24 1647   Or   acetaminophen  (TYLENOL ) suppository 650 mg  650 mg Rectal Q6H PRN Seena Marsa NOVAK, MD       amoxicillin-clavulanate (AUGMENTIN) 875-125 MG per tablet 1 tablet  1 tablet Oral Q12H Zeigler, Dustin G, RPH   1 tablet at 06/27/24 0828   atenolol  (TENORMIN ) tablet 25 mg  25 mg Oral Daily Melvin, Alexander B, MD   25 mg at 06/27/24 9171   atorvastatin  (LIPITOR) tablet 20 mg  20 mg Oral Daily Agbata, Tochukwu, MD   20 mg at 06/27/24 9171   bexarotene  (TARGRETIN ) capsule 75 mg  75 mg Oral TID WC Lenon Elsie HERO, RPH   75 mg at 06/27/24 1601   calcium  carbonate (TUMS - dosed in mg elemental calcium ) chewable tablet 200 mg of elemental calcium   1 tablet Oral TID PRN Patel, Sona, MD       Chlorhexidine  Gluconate Cloth 2 % PADS 6 each  6 each Topical Daily Agbata, Tochukwu, MD   6 each at 06/27/24 0831   enoxaparin  (LOVENOX ) injection 40 mg  40 mg Subcutaneous Q24H Melvin, Alexander B, MD   40 mg at 06/27/24 1606   feeding supplement (ENSURE PLUS HIGH PROTEIN) liquid 237 mL  237 mL Oral BID BM Duncan, Hazel V, MD   237 mL at 06/27/24 1203   fluconazole (DIFLUCAN) tablet 100 mg  100 mg Oral Q1400 Ravishankar, Jayashree, MD   100 mg at 06/27/24 1201   folic acid  (FOLVITE ) tablet 1 mg  1 mg Oral Daily Agbata, Tochukwu, MD   1 mg at 06/27/24 0828   hydrOXYzine  (ATARAX )  tablet 25 mg  25 mg Oral Q6H PRN Melvin, Alexander B, MD   25 mg at 06/24/24 2135   leptospermum manuka honey (MEDIHONEY) paste 1 Application  1 Application Topical Daily Agbata, Tochukwu, MD   1 Application at 06/27/24 0830   liver oil-zinc oxide (DESITIN) 40 % ointment   Topical BID Lanetta Lingo, MD   Given at 06/27/24 2050   mirtazapine  (REMERON ) tablet  30 mg  30 mg Oral QHS Agbata, Tochukwu, MD   30 mg at 06/26/24 2154   montelukast  (SINGULAIR ) tablet 10 mg  10 mg Oral QHS Melvin, Alexander B, MD   10 mg at 06/26/24 2153   morphine  (MS CONTIN ) 12 hr tablet 15 mg  15 mg Oral Q12H Cook, Joshua R, NP   15 mg at 06/27/24 1415   morphine  (PF) 2 MG/ML injection 2 mg  2 mg Intravenous Q4H PRN Agbata, Tochukwu, MD   2 mg at 06/27/24 2036   oxyCODONE  (Oxy IR/ROXICODONE ) immediate release tablet 5-10 mg  5-10 mg Oral Q4H PRN Melvin, Alexander B, MD   5 mg at 06/27/24 0045   pantoprazole  (PROTONIX ) EC tablet 40 mg  40 mg Oral Daily PRN Melvin, Alexander B, MD       polyethylene glycol (MIRALAX  / GLYCOLAX ) packet 17 g  17 g Oral Daily PRN Melvin, Alexander B, MD       polyethylene glycol (MIRALAX  / GLYCOLAX ) packet 17 g  17 g Oral Daily Agbata, Tochukwu, MD   17 g at 06/25/24 1239   pregabalin  (LYRICA ) capsule 100 mg  100 mg Oral BID Melvin, Alexander B, MD   100 mg at 06/27/24 9171   senna-docusate (Senokot-S) tablet 2 tablet  2 tablet Oral QHS Agbata, Tochukwu, MD   2 tablet at 06/25/24 2205   sodium chloride  flush (NS) 0.9 % injection 10-40 mL  10-40 mL Intracatheter Q12H Agbata, Tochukwu, MD   10 mL at 06/27/24 2051   sodium chloride  flush (NS) 0.9 % injection 10-40 mL  10-40 mL Intracatheter PRN Agbata, Tochukwu, MD       sodium chloride  flush (NS) 0.9 % injection 3 mL  3 mL Intravenous Q12H Seena Marsa NOVAK, MD   3 mL at 06/27/24 2051   triamcinolone  cream (KENALOG ) 0.1 % cream   Topical BID Tobie Calix, MD   Given at 06/27/24 1241    PHYSICAL EXAMINATION:   Vitals:   06/27/24 1558  06/27/24 2034  BP: (!) 138/58 129/88  Pulse: 67 91  Resp: 18 20  Temp: 99.8 F (37.7 C) 98.9 F (37.2 C)  SpO2: 99% 98%   Filed Weights   06/23/24 1149  Weight: 181 lb (82.1 kg)   Multiple erythematous lesions all over the body; bilateral extremity edema; scrotal edema nodular lesions  Physical Exam Vitals and nursing note reviewed.  HENT:     Head: Normocephalic and atraumatic.     Mouth/Throat:     Pharynx: Oropharynx is clear.  Eyes:     Extraocular Movements: Extraocular movements intact.     Pupils: Pupils are equal, round, and reactive to light.  Cardiovascular:     Rate and Rhythm: Normal rate and regular rhythm.  Pulmonary:     Comments: Decreased breath sounds bilaterally.  Abdominal:     Palpations: Abdomen is soft.  Musculoskeletal:        General: Normal range of motion.     Cervical back: Normal range of motion.  Skin:    General: Skin is warm.  Neurological:     General: No focal deficit present.     Mental Status: He is alert and oriented to person, place, and time.  Psychiatric:        Behavior: Behavior normal.        Judgment: Judgment normal.     LABORATORY DATA:  I have reviewed the data as listed Lab Results  Component Value Date   WBC 5.2 06/26/2024  HGB 10.7 (L) 06/26/2024   HCT 33.3 (L) 06/26/2024   MCV 98.8 06/26/2024   PLT 263 06/26/2024   Recent Labs    06/22/24 0904 06/23/24 1151 06/24/24 0525 06/25/24 0941 06/26/24 0530  NA 135 133* 137  --  138  K 3.6 3.7 3.6  --  3.7  CL 101 98 104  --  106  CO2 25 22 26   --  22  GLUCOSE 107* 106* 95  --  87  BUN 23 15 14   --  14  CREATININE 1.19 1.02 0.84 0.85 0.76  CALCIUM  8.8* 8.7* 8.2*  --  8.2*  GFRNONAA >60 >60 >60 >60 >60  PROT 6.6 6.3* 5.2*  --   --   ALBUMIN 3.1* 2.9* 2.2*  --   --   AST 25 25 18   --   --   ALT 15 15 14   --   --   ALKPHOS 58 50 41  --   --   BILITOT 0.7 1.1 0.9  --   --     RADIOGRAPHIC STUDIES: I have personally reviewed the radiological images as  listed and agreed with the findings in the report. CT CHEST WO CONTRAST Result Date: 06/27/2024 CLINICAL DATA:  Follow-up of pulmonary nodules. History of T-cell lymphoma. * Tracking Code: BO * EXAM: CT CHEST WITHOUT CONTRAST TECHNIQUE: Multidetector CT imaging of the chest was performed following the standard protocol without IV contrast. RADIATION DOSE REDUCTION: This exam was performed according to the departmental dose-optimization program which includes automated exposure control, adjustment of the mA and/or kV according to patient size and/or use of iterative reconstruction technique. COMPARISON:  PET of 04/03/2024.  Chest CT of 02/01/2024 FINDINGS: Cardiovascular: Right Port-A-Cath tip high right atrium. Aortic atherosclerosis. Mild cardiomegaly. Left main and 3 vessel coronary artery calcification. Mediastinum/Nodes: No supraclavicular adenopathy. No mediastinal or hilar adenopathy, given limitations of unenhanced CT. Lungs/Pleura: New small left and trace right pleural effusions. Index posterior left upper lobe nodule measures 2.8 by 2.5 cm on 58/3. Compare 2.0 x 2.1 cm on the prior PET. Anterior right lower lobe lung mass measures 3.3 x 2.9 cm on 88/3 versus 2.2 x 2.1 cm on the prior PET (when remeasured). Right middle lobe 2.7 x 2.5 cm nodule on image 100/3, increased from 1.1 x 1.0 cm on the prior PET (when remeasured). Upper Abdomen: Multiple dependent gallstones. Subcentimeter segment 2 hepatic cyst. Normal imaged portions of the spleen, stomach, pancreas, adrenal glands, kidneys. Artifact degradation secondary to lumbar spine fixation. No upper abdominal adenopathy identified. Musculoskeletal: Posterior left chest wall subcutaneous 2.3 cm lesion on 101/2, likely present on the prior PET. Upper lumbar spine fixation. IMPRESSION: 1. Moderate enlargement of pulmonary nodules since the PET of 04/03/2024, presumably progression of lymphoma. 2. No thoracic adenopathy. 3. New tiny bilateral pleural  effusions. 4. Posterior chest wall subcutaneous lesion is likely similar to on the prior PET. 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 5. Incidental findings, including: Coronary artery atherosclerosis. Aortic Atherosclerosis (ICD10-I70.0). Cholelithiasis. Electronically Signed   By: Rockey Kilts M.D.   On: 06/27/2024 18:20   DG Chest Portable 1 View Result Date: 06/23/2024 CLINICAL DATA:  Cough, fever, history of mycosis fungoides / T-cell lymphoma EXAM: PORTABLE CHEST 1 VIEW COMPARISON:  10/28/2023, 04/03/2024 FINDINGS: Single frontal view of the chest demonstrates right chest wall port via internal jugular approach, tip overlying superior vena cava. The cardiac silhouette is unremarkable. There are multiple bilateral pulmonary nodules, largest in the  right lower lung zone measuring 3.6 cm increased since prior PET scan. No acute airspace disease, effusion, or pneumothorax. No acute bony abnormalities. IMPRESSION: 1. Enlarging pulmonary nodules consistent with progression of known pulmonary metastases. 2. No acute airspace disease. Electronically Signed   By: Ozell Daring M.D.   On: 06/23/2024 14:59     No problem-specific Assessment & Plan notes found for this encounter.   78 year old male patient with history of stage IV mycosis fungoides-currently admitted to hospital for worsening pain/was concerning for wound infection  # Stage IV mycosis fungoides-clinically progressing on current chemotherapy with pralatrexate  plus bexarotene .  # Fevers-wound infection-currently on antibiotics as per ID.  Appreciate ID recommendations.  # Malignancy related pain poorly controlled  # Recommendation/plan:  # Unfortunately, limited options for further lines of therapy for his mycosis fungoides.   Treatment options include Doxil and Bendamustine.  Discussed with Dr. Jacques at [facility].  Recommend CT of the chest to assess for visceral disease  # Recommend evaluation with Manuel  Cook, palliative care-for pain control.  # Thank you Dr. Tobie for allowing me to participate in the care of your pleasant patient. Please do not hesitate to contact me with questions or concerns in the interim. Above plan of care was discussed with patient and his wife Manuel Cook over the phone detail.     Cindy JONELLE Joe, MD 06/27/2024 10:22 PM

## 2024-06-27 NOTE — Consult Note (Signed)
 Palliative Medicine [facility] at [facility] Telephone:(336) [phone] Fax:(336) [phone]   Name: Manuel Cook Date: 06/27/2024 MRN: [id number]  DOB: 06/25/1946  Patient Care Team: Sadie Manna, MD as PCP - General (Internal Medicine) Rennie Cindy JONELLE, MD as Consulting Physician (Oncology)    REASON FOR CONSULTATION: Manuel Cook is a 78 y.o. 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 Patient is referred to palliative care to address goals.   SOCIAL HISTORY:     reports that he has quit smoking. He has never used smokeless tobacco. He reports current alcohol  use. He reports that he does not use drugs.  Patient is married lives at home with his wife.  He has 3 sons who live nearby.  Patient retired as an Personnel officer.  ADVANCE DIRECTIVES:  Not on file  CODE STATUS: Full code  PAST MEDICAL HISTORY: 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HCC)    Pneumonia    Sleep apnea     PAST SURGICAL HISTORY:  Past Surgical History:  Procedure Laterality Date   ANTERIOR LAT LUMBAR FUSION N/A 04/08/2021   Procedure: Lumbar one-two Lumbar two-three Anterolateral lumbar interbody fusion with posterior percutaneous fixation Lumbar one to Lumbar three, removal of old hardware at Lumbar four-five;  Surgeon: Colon Shove, MD;  Location: [facility] OR;  Service: Neurosurgery;  Laterality: N/A;   BACK SURGERY     BIOPSY  07/20/2023   Procedure: BIOPSY;  Surgeon: Maryruth Ole DASEN, MD;  Location: [facility] ENDOSCOPY;  Service: Endoscopy;;   CARDIAC CATHETERIZATION     COLONOSCOPY WITH PROPOFOL  N/A 12/22/2016   Procedure: COLONOSCOPY WITH PROPOFOL ;  Surgeon: Gladis RAYMOND Mariner, MD;  Location: [facility] ENDOSCOPY;   Service: Endoscopy;  Laterality: N/A;   CORONARY ANGIOPLASTY WITH STENT PLACEMENT     ESOPHAGOGASTRODUODENOSCOPY N/A 09/12/2021   Procedure: ESOPHAGOGASTRODUODENOSCOPY (EGD);  Surgeon: Onita Elspeth Sharper, DO;  Location: [facility] ENDOSCOPY;  Service: Gastroenterology;  Laterality: N/A;   ESOPHAGOGASTRODUODENOSCOPY (EGD) WITH PROPOFOL  N/A 07/20/2023   Procedure: ESOPHAGOGASTRODUODENOSCOPY (EGD) WITH PROPOFOL ;  Surgeon: Maryruth Ole DASEN, MD;  Location: [facility] ENDOSCOPY;  Service: Endoscopy;  Laterality: N/A;   EYE SURGERY     LUMBAR FUSION  10/28/2009   L3-5 PLIF   LUMBAR PERCUTANEOUS PEDICLE SCREW 2 LEVEL N/A 04/08/2021   Procedure: LUMBAR PERCUTANEOUS PEDICLE SCREW LUMBAR ONE TO THREE;  Surgeon: Colon Shove, MD;  Location: [facility] OR;  Service: Neurosurgery;  Laterality: N/A;   Neck fusion  1995   PORTA CATH INSERTION N/A 04/06/2024   Procedure: PORTA CATH INSERTION;  Surgeon: Marea Selinda RAMAN, MD;  Location: [facility] INVASIVE CV LAB;  Service: Cardiovascular;  Laterality: N/A;   TONSILLECTOMY      HEMATOLOGY/ONCOLOGY HISTORY:  Oncology History Overview Note  Diagnosis: Mycosis fungoides with minimal CD30+ (less than 1%) on 06/2022 and 09/2023 biopsy, CD5+ in 50% Date of Diagnosis: 2012 Stage: T1bN0M0B0 (stage 1b) at diagnosis 07/20/22 and 10/14/23- T3N0M0B0 (stage IIB) but in 2025 he had multiple tumors coming up so disease activity had clearly changed but PET scan negative for extracutaneous disease  Regimen:  Steroid creams since 2013 NBUVB phototherapy started 2014-09/2014 Methotrexate 20mg /week 2019-2020 and then not see by derm for several years until10/2023  Radiation: 2400 cGy to his right lower arm lesions and left arm lesion. 08/2022 09/2023 - progressive tumors - biopsy of skin cd30-, CD5+, CD4-/CD8- - no large cell transformation  10/2023-01/28/2024 - gemcitabine  x 4 cycles --> The tumors resolved with gemcitabine  but patch/plaque disease on his skin elsewhere progressed; 03/03/24 - bexarotene   225mg /day   # since 2012- Cutaneous T-cell lymphoma-# FEB 2025- PET [facility]-  Extensive cutaneous/subcutaneous hypermetabolic lesions in the bilateral upper and lower extremities, consistent with history of cutaneous T-cell lymphoma. No convincing evidence of nodal or visceral involvement. # MAY 2025- CT chest- IMPRESSION: 1. Numerous new bilateral pulmonary nodules, measuring up to 10 mm. Imaging features are highly concerning for metastatic disease.  # Patient currently status post multiple lines of therapy -most recently noted to have  progression with new tumors despite bexarotene .    # JULY 17th, 2025- pralatrexate - 15mg /m2 for 3/4 weeks; AUG 15th- added Bexarotene     Mycosis fungoides, lymph nodes of multiple sites (HCC)  05/24/2012 Initial Diagnosis   Mycosis fungoides, unspecified site (HCC)   04/03/2024 Cancer Staging   Staging form: Mycosis Fungoides and Sezary Syndrome, AJCC 8th Edition - Clinical: Stage IVB (cT4, cN0, pM1, B0) - Signed by Rennie Cindy SAUNDERS, MD on 04/03/2024   04/13/2024 -  Chemotherapy   Patient is on Treatment Plan : NON-HODGKINS LYMPHOMA T-CELL Pralatrexate  [id number]       ALLERGIES:  is allergic to sulfa antibiotics.  MEDICATIONS:  Current Facility-Administered Medications  Medication Dose Route Frequency Provider Last Rate Last Admin   acetaminophen  (TYLENOL ) tablet 650 mg  650 mg Oral Q6H PRN Melvin, Alexander B, MD   650 mg at 06/26/24 1647   Or   acetaminophen  (TYLENOL ) suppository 650 mg  650 mg Rectal Q6H PRN Seena Marsa NOVAK, MD       amoxicillin-clavulanate (AUGMENTIN) 875-125 MG per tablet 1 tablet  1 tablet Oral Q12H Zeigler, Dustin G, RPH   1 tablet at 06/27/24 0828   atenolol  (TENORMIN ) tablet 25 mg  25 mg Oral Daily Melvin, Alexander B, MD   25 mg at 06/27/24 9171   atorvastatin  (LIPITOR) tablet 20 mg  20 mg Oral Daily Agbata, Tochukwu, MD   20 mg at 06/27/24 9171   bexarotene  (TARGRETIN ) capsule 75 mg  75 mg Oral TID WC Lenon Elsie HERO, RPH    75 mg at 06/27/24 1201   calcium  carbonate (TUMS - dosed in mg elemental calcium ) chewable tablet 200 mg of elemental calcium   1 tablet Oral TID PRN Patel, Sona, MD       Chlorhexidine  Gluconate Cloth 2 % PADS 6 each  6 each Topical Daily Agbata, Tochukwu, MD   6 each at 06/27/24 0831   enoxaparin  (LOVENOX ) injection 40 mg  40 mg Subcutaneous Q24H Melvin, Alexander B, MD   40 mg at 06/26/24 1820   feeding supplement (ENSURE PLUS HIGH PROTEIN) liquid 237 mL  237 mL Oral BID BM Duncan, Hazel V, MD   237 mL at 06/27/24 1203   fluconazole (DIFLUCAN) tablet 100 mg  100 mg Oral Q1400 Ravishankar, Jayashree, MD   100 mg at 06/27/24 1201   folic acid  (FOLVITE ) tablet 1 mg  1 mg Oral Daily Agbata, Tochukwu, MD   1 mg at 06/27/24 9171   hydrOXYzine  (ATARAX ) tablet 25 mg  25 mg Oral Q6H PRN Melvin, Alexander B, MD   25 mg at 06/24/24 2135   leptospermum manuka honey (MEDIHONEY) paste 1 Application  1 Application Topical Daily Lanetta Lingo, MD  1 Application at 06/27/24 0830   liver oil-zinc oxide (DESITIN) 40 % ointment   Topical BID Lanetta Lingo, MD   Given at 06/27/24 0831   mirtazapine  (REMERON ) tablet 30 mg  30 mg Oral QHS Agbata, Tochukwu, MD   30 mg at 06/26/24 2154   montelukast  (SINGULAIR ) tablet 10 mg  10 mg Oral QHS Melvin, Alexander B, MD   10 mg at 06/26/24 2153   morphine  (MS CONTIN ) 12 hr tablet 15 mg  15 mg Oral Q12H Bhakti Labella R, NP   15 mg at 06/27/24 1415   morphine  (PF) 2 MG/ML injection 2 mg  2 mg Intravenous Q4H PRN Agbata, Tochukwu, MD   2 mg at 06/27/24 9762   oxyCODONE  (Oxy IR/ROXICODONE ) immediate release tablet 5-10 mg  5-10 mg Oral Q4H PRN Melvin, Alexander B, MD   5 mg at 06/27/24 0045   pantoprazole  (PROTONIX ) EC tablet 40 mg  40 mg Oral Daily PRN Melvin, Alexander B, MD       polyethylene glycol (MIRALAX  / GLYCOLAX ) packet 17 g  17 g Oral Daily PRN Melvin, Alexander B, MD       polyethylene glycol (MIRALAX  / GLYCOLAX ) packet 17 g  17 g Oral Daily Agbata, Tochukwu, MD    17 g at 06/25/24 1239   pregabalin  (LYRICA ) capsule 100 mg  100 mg Oral BID Melvin, Alexander B, MD   100 mg at 06/27/24 9171   senna-docusate (Senokot-S) tablet 2 tablet  2 tablet Oral QHS Agbata, Tochukwu, MD   2 tablet at 06/25/24 2205   sodium chloride  flush (NS) 0.9 % injection 10-40 mL  10-40 mL Intracatheter Q12H Agbata, Tochukwu, MD   10 mL at 06/27/24 0816   sodium chloride  flush (NS) 0.9 % injection 10-40 mL  10-40 mL Intracatheter PRN Agbata, Tochukwu, MD       sodium chloride  flush (NS) 0.9 % injection 3 mL  3 mL Intravenous Q12H Seena Marsa NOVAK, MD   3 mL at 06/27/24 0816   triamcinolone  cream (KENALOG ) 0.1 % cream   Topical BID Patel, Sona, MD   Given at 06/27/24 1241    VITAL SIGNS: BP (!) 141/55 (BP Location: Left Leg)   Pulse 85   Temp 98.2 F (36.8 C)   Resp 18   Ht 5' 8 (1.727 m)   Wt 181 lb (82.1 kg)   SpO2 92%   BMI 27.52 kg/m  Filed Weights   06/23/24 1149  Weight: 181 lb (82.1 kg)    Estimated body mass index is 27.52 kg/m as calculated from the following:   Height as of this encounter: 5' 8 (1.727 m).   Weight as of this encounter: 181 lb (82.1 kg).  LABS: CBC:    Component Value Date/Time   WBC 5.2 06/26/2024 0530   HGB 10.7 (L) 06/26/2024 0530   HGB 12.3 (L) 06/22/2024 0904   HCT 33.3 (L) 06/26/2024 0530   PLT 263 06/26/2024 0530   PLT 307 06/22/2024 0904   MCV 98.8 06/26/2024 0530   NEUTROABS 4.4 06/23/2024 1151   LYMPHSABS 0.6 (L) 06/23/2024 1151   MONOABS 0.6 06/23/2024 1151   EOSABS 0.0 06/23/2024 1151   BASOSABS 0.1 06/23/2024 1151   Comprehensive Metabolic Panel:    Component Value Date/Time   NA 138 06/26/2024 0530   K 3.7 06/26/2024 0530   CL 106 06/26/2024 0530   CO2 22 06/26/2024 0530   BUN 14 06/26/2024 0530   CREATININE 0.76 06/26/2024 0530   CREATININE 1.19  06/22/2024 0904   GLUCOSE 87 06/26/2024 0530   CALCIUM  8.2 (L) 06/26/2024 0530   AST 18 06/24/2024 0525   AST 25 06/22/2024 0904   ALT 14 06/24/2024 0525    ALT 15 06/22/2024 0904   ALKPHOS 41 06/24/2024 0525   BILITOT 0.9 06/24/2024 0525   BILITOT 0.7 06/22/2024 0904   PROT 5.2 (L) 06/24/2024 0525   ALBUMIN 2.2 (L) 06/24/2024 0525    RADIOGRAPHIC STUDIES: DG Chest Portable 1 View Result Date: 06/23/2024 CLINICAL DATA:  Cough, fever, history of mycosis fungoides / T-cell lymphoma EXAM: PORTABLE CHEST 1 VIEW COMPARISON:  10/28/2023, 04/03/2024 FINDINGS: Single frontal view of the chest demonstrates right chest wall port via internal jugular approach, tip overlying superior vena cava. The cardiac silhouette is unremarkable. There are multiple bilateral pulmonary nodules, largest in the right lower lung zone measuring 3.6 cm increased since prior PET scan. No acute airspace disease, effusion, or pneumothorax. No acute bony abnormalities. IMPRESSION: 1. Enlarging pulmonary nodules consistent with progression of known pulmonary metastases. 2. No acute airspace disease. Electronically Signed   By: Ozell Daring M.D.   On: 06/23/2024 14:59    PERFORMANCE STATUS (ECOG) : 2 - Symptomatic, <50% confined to bed  Review of Systems Unless otherwise noted, a complete review of systems is negative.  Physical Exam General: NAD Pulmonary: Unlabored Extremities: no edema, no joint deformities Skin: multiple lesions all over body Neurological: Weakness but otherwise nonfocal  IMPRESSION: Patient well-known to me from the clinic.  He has cutaneous T-cell lymphoma/mycosis fungoides with multiple skin lesions.  Unfortunately, patient has had progression despite palliative chemotherapy.  Met with patient to discuss goals.  He recognizes that he is doing poorly and describes poor quality of life.  We discussed possible option of focusing on his comfort and possibly involving hospice.  However, patient says that he remains hopeful and would like to pursue additional chemotherapy if any options are available.  Symptomatically, he endorses significant and poorly  controlled pain.  Patient has been on oxycodone  with dosing liberalized in the outpatient setting.  He was tried on Xtampza  ER but found it too sedating.  Patient describes poorly controlled pain on short acting opioid.  Will trial MS Contin  and monitor for adverse effects.  Discussed CODE STATUS.  Patient says he wants to remain a full code for now but says that he would revisit that decision if he continues to do poorly.  PLAN: - Continue current scope of treatment - Start MS Contin  15 mg every 12 hours - Continue oxycodone  as needed for breakthrough pain - Daily bowel regimen - Will follow  Case and plan discussed with Drs. Tobie and [place]  Time Total: 45 minutes  Visit consisted of counseling and education dealing with the complex and emotionally intense issues of symptom management and palliative care in the setting of serious and potentially life-threatening illness.Greater than 50%  of this time was spent counseling and coordinating care related to the above assessment and plan.  Signed by: Fonda Mower, PhD, NP-C

## 2024-06-27 NOTE — Progress Notes (Signed)
 [facility]  - [place] at [facility]   PATIENT NAME: Manuel Cook    MR#:  [id number]  DATE OF BIRTH:  1946-09-14  SUBJECTIVE:      VITALS:  Blood pressure (!) 141/55, pulse 85, temperature 98.2 F (36.8 C), resp. rate 18, height 5' 8 (1.727 m), weight 82.1 kg, SpO2 92%.  PHYSICAL EXAMINATION:   GENERAL:  78 y.o.-year-old patient with no acute distress.  LUNGS: Normal breath sounds bilaterally, no wheezing CARDIOVASCULAR: S1, S2 normal. No murmur   ABDOMEN: Soft, nontender, nondistended. Bowel sounds present.  EXTREMITIES: No  edema b/l.    NEUROLOGIC: nonfocal  patient is alert and awake SKIN:      LABORATORY PANEL:  CBC Recent Labs  Lab 06/26/24 0530  WBC 5.2  HGB 10.7*  HCT 33.3*  PLT 263    Chemistries  Recent Labs  Lab 06/24/24 0525 06/25/24 0941 06/26/24 0530  NA 137  --  138  K 3.6  --  3.7  CL 104  --  106  CO2 26  --  22  GLUCOSE 95  --  87  BUN 14  --  14  CREATININE 0.84   < > 0.76  CALCIUM  8.2*  --  8.2*  AST 18  --   --   ALT 14  --   --   ALKPHOS 41  --   --   BILITOT 0.9  --   --    < > = values in this interval not displayed.   Assessment and Plan Manuel Cook is a 78 y.o. male with medical history significant of hypertension, hyperlipidemia, BPH, CAD status post stent, OSA, cutaneous T-cell lymphoma, C-spine fusion, lumbar surgery presenting with worsening wounds.   Patient is followed by [facility] wound care nurse due to his chronic wounds from his T-cell lymphoma.  Has a worsening urinalysis.  The patient is becoming infected.  Purulent drainage.  Cutaneous T-cell lymphoma stage IV/Mycosis fungoides significant pain/chronic pain syndrome --Patient with a history of cutaneous T-cell lymphoma/mycosis fungoides with multiple skin lesions --He has multiple wounds but the wound over his right hip has increased purulence and he had subjective fevers at home with a T max of 100.9 --Patient has no leukocytosis but given  his immunocompromised status and severity of his lesions started on IV antibiotics in the ED  -- ID consult appreciated. Per Dr. Searcy antibiotics don't play a role. He finish course of it. -- Per wife patient was prescribed triamcinolone  acetate cream through [facility] dermatology will use that  --Patient currently status post multiple lines of therapy -most recently noted to have  progression with new tumors despite palliative chemotherapy with bexarotene ,leucovorin . and Pralatrexate   --- patient followed by Dr. Rennie.  --Palliative care consultation appreciated for pain management.   Hypertension --Blood pressure is stable atenolol  and amlodipine   Hyperlipidemia --Continue atorvastatin    CAD --Continue atenolol  and  atorvastatin    OSA - Not on CPAP   Anemia of chronic disease H&H is stable       Procedures: Family communication : wife at bedside Consults : oncology CODE STATUS: full DVT Prophylaxis : enoxaparin  Level of care: Telemetry Medical Status is: Inpatient Remains inpatient appropriate because: control of pain with IV NPO pain meds. Palliative care nurse practitioner    TOTAL TIME TAKING CARE OF THIS PATIENT: 35 minutes.  >50% time spent on counselling and coordination of care  Note: This dictation was prepared with Dragon dictation along with smaller phrase technology.  Any transcriptional errors that result from this process are unintentional.  Leita Blanch M.D    [facility]   CC: Primary care physician; Sadie Manna, MD

## 2024-06-28 ENCOUNTER — Ambulatory Visit: Admitting: Physician Assistant

## 2024-06-28 DIAGNOSIS — C8408 Mycosis fungoides, lymph nodes of multiple sites: Secondary | ICD-10-CM | POA: Diagnosis not present

## 2024-06-28 DIAGNOSIS — L089 Local infection of the skin and subcutaneous tissue, unspecified: Secondary | ICD-10-CM | POA: Diagnosis not present

## 2024-06-28 DIAGNOSIS — R52 Pain, unspecified: Secondary | ICD-10-CM | POA: Diagnosis not present

## 2024-06-28 DIAGNOSIS — N401 Enlarged prostate with lower urinary tract symptoms: Secondary | ICD-10-CM | POA: Diagnosis not present

## 2024-06-28 DIAGNOSIS — T148XXA Other injury of unspecified body region, initial encounter: Secondary | ICD-10-CM | POA: Diagnosis not present

## 2024-06-28 DIAGNOSIS — Z515 Encounter for palliative care: Secondary | ICD-10-CM | POA: Diagnosis not present

## 2024-06-28 LAB — CULTURE, BLOOD (ROUTINE X 2)
Culture: NO GROWTH
Culture: NO GROWTH

## 2024-06-28 MED ORDER — VITAMIN C 500 MG PO TABS
500.0000 mg | ORAL_TABLET | Freq: Two times a day (BID) | ORAL | Status: DC
  Administered 2024-06-28 – 2024-06-29 (×2): 500 mg via ORAL
  Filled 2024-06-28 (×2): qty 1

## 2024-06-28 MED ORDER — ADULT MULTIVITAMIN W/MINERALS CH
1.0000 | ORAL_TABLET | Freq: Every day | ORAL | Status: DC
  Administered 2024-06-29: 1 via ORAL
  Filled 2024-06-28: qty 1

## 2024-06-28 MED ORDER — ENSURE PLUS HIGH PROTEIN PO LIQD
237.0000 mL | Freq: Three times a day (TID) | ORAL | Status: DC
  Administered 2024-06-28 – 2024-06-29 (×2): 237 mL via ORAL

## 2024-06-28 NOTE — Progress Notes (Signed)
 Palliative Medicine [facility] at [facility] [phone] [phone]   Name: Manuel Cook Date: 06/28/2024 MRN: [id number]  DOB: 1946-06-13  Patient Care Team: Sadie Manna, MD as PCP - General (Internal Medicine) Rennie Cindy JONELLE, MD as Consulting Physician (Oncology)    REASON FOR CONSULTATION: Manuel Cook is a 78 y.o. 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 Patient is referred to palliative care to address goals.    CODE STATUS: Full code  PAST MEDICAL HISTORY: 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HCC)    Pneumonia    Sleep apnea     PAST SURGICAL HISTORY:  Past Surgical History:  Procedure Laterality Date   ANTERIOR LAT LUMBAR FUSION N/A 04/08/2021   Procedure: Lumbar one-two Lumbar two-three Anterolateral lumbar interbody fusion with posterior percutaneous fixation Lumbar one to Lumbar three, removal of old hardware at Lumbar four-five;  Surgeon: Colon Shove, MD;  Location: [facility] OR;  Service: Neurosurgery;  Laterality: N/A;   BACK SURGERY     BIOPSY  07/20/2023   Procedure: BIOPSY;  Surgeon: Maryruth Ole DASEN, MD;  Location: [facility] ENDOSCOPY;  Service: Endoscopy;;   CARDIAC CATHETERIZATION     COLONOSCOPY WITH PROPOFOL  N/A 12/22/2016   Procedure: COLONOSCOPY WITH PROPOFOL ;  Surgeon: Gladis RAYMOND Mariner, MD;  Location: [facility] ENDOSCOPY;  Service: Endoscopy;  Laterality: N/A;   CORONARY ANGIOPLASTY WITH STENT PLACEMENT     ESOPHAGOGASTRODUODENOSCOPY N/A 09/12/2021   Procedure: ESOPHAGOGASTRODUODENOSCOPY (EGD);  Surgeon: Onita Elspeth Sharper, DO;  Location: [facility] ENDOSCOPY;  Service: Gastroenterology;  Laterality: N/A;   ESOPHAGOGASTRODUODENOSCOPY (EGD)  WITH PROPOFOL  N/A 07/20/2023   Procedure: ESOPHAGOGASTRODUODENOSCOPY (EGD) WITH PROPOFOL ;  Surgeon: Maryruth Ole DASEN, MD;  Location: [facility] ENDOSCOPY;  Service: Endoscopy;  Laterality: N/A;   EYE SURGERY     LUMBAR FUSION  10/28/2009   L3-5 PLIF   LUMBAR PERCUTANEOUS PEDICLE SCREW 2 LEVEL N/A 04/08/2021   Procedure: LUMBAR PERCUTANEOUS PEDICLE SCREW LUMBAR ONE TO THREE;  Surgeon: Colon Shove, MD;  Location: [facility] OR;  Service: Neurosurgery;  Laterality: N/A;   Neck fusion  1995   PORTA CATH INSERTION N/A 04/06/2024   Procedure: PORTA CATH INSERTION;  Surgeon: Marea Selinda RAMAN, MD;  Location: [facility] INVASIVE CV LAB;  Service: Cardiovascular;  Laterality: N/A;   TONSILLECTOMY      HEMATOLOGY/ONCOLOGY HISTORY:  Oncology History Overview Note  Diagnosis: Mycosis fungoides with minimal CD30+ (less than 1%) on 06/2022 and 09/2023 biopsy, CD5+ in 50% Date of Diagnosis: 2012 Stage: T1bN0M0B0 (stage 1b) at diagnosis 07/20/22 and 10/14/23- T3N0M0B0 (stage IIB) but in 2025 he had multiple tumors coming up so disease activity had clearly changed but PET scan negative for extracutaneous disease  Regimen:  Steroid creams since 2013 [facility] phototherapy started 2014-09/2014 Methotrexate 20mg /week 2019-2020 and then not see by derm for several years until10/2023 Radiation: 2400 cGy to his right lower arm lesions and left arm lesion. 08/2022 09/2023 - progressive tumors - biopsy of skin cd30-, CD5+, CD4-/CD8- - no large cell transformation  10/2023-01/28/2024 - gemcitabine  x 4 cycles --> The tumors resolved with gemcitabine  but patch/plaque disease on his skin elsewhere progressed; 03/03/24 - bexarotene  225mg /day   # since 2012- Cutaneous T-cell  lymphoma-# FEB 2025- PET [facility]-  Extensive cutaneous/subcutaneous hypermetabolic lesions in the bilateral upper and lower extremities, consistent with history of cutaneous T-cell lymphoma. No convincing evidence of nodal or visceral involvement. # MAY 2025- CT chest- IMPRESSION: 1.  Numerous new bilateral pulmonary nodules, measuring up to 10 mm. Imaging features are highly concerning for metastatic disease.  # Patient currently status post multiple lines of therapy -most recently noted to have  progression with new tumors despite bexarotene .    # JULY 17th, 2025- pralatrexate - 15mg /m2 for 3/4 weeks; AUG 15th- added Bexarotene     Mycosis fungoides, lymph nodes of multiple sites (HCC)  05/24/2012 Initial Diagnosis   Mycosis fungoides, unspecified site (HCC)   04/03/2024 Cancer Staging   Staging form: Mycosis Fungoides and Sezary Syndrome, AJCC 8th Edition - Clinical: Stage IVB (cT4, cN0, pM1, B0) - Signed by Rennie Cindy SAUNDERS, MD on 04/03/2024   04/13/2024 -  Chemotherapy   Patient is on Treatment Plan : NON-HODGKINS LYMPHOMA T-CELL Pralatrexate  q49d       ALLERGIES:  is allergic to sulfa antibiotics.  MEDICATIONS:  Current Facility-Administered Medications  Medication Dose Route Frequency Provider Last Rate Last Admin   acetaminophen  (TYLENOL ) tablet 650 mg  650 mg Oral Q6H PRN Melvin, Alexander B, MD   650 mg at 06/26/24 1647   Or   acetaminophen  (TYLENOL ) suppository 650 mg  650 mg Rectal Q6H PRN Seena Marsa NOVAK, MD       amoxicillin-clavulanate (AUGMENTIN) 875-125 MG per tablet 1 tablet  1 tablet Oral Q12H Zeigler, Dustin G, RPH   1 tablet at 06/28/24 1239   atenolol  (TENORMIN ) tablet 25 mg  25 mg Oral Daily Melvin, Alexander B, MD   25 mg at 06/28/24 1314   atorvastatin  (LIPITOR) tablet 20 mg  20 mg Oral Daily Agbata, Tochukwu, MD   20 mg at 06/28/24 1241   bexarotene  (TARGRETIN ) capsule 75 mg  75 mg Oral TID WC Lenon Elsie HERO, RPH   75 mg at 06/28/24 1238   calcium  carbonate (TUMS - dosed in mg elemental calcium ) chewable tablet 200 mg of elemental calcium   1 tablet Oral TID PRN Patel, Sona, MD       Chlorhexidine  Gluconate Cloth 2 % PADS 6 each  6 each Topical Daily Agbata, Tochukwu, MD   6 each at 06/28/24 1000   enoxaparin  (LOVENOX ) injection 40  mg  40 mg Subcutaneous Q24H Melvin, Alexander B, MD   40 mg at 06/27/24 1606   feeding supplement (ENSURE PLUS HIGH PROTEIN) liquid 237 mL  237 mL Oral BID BM Duncan, Hazel V, MD   237 mL at 06/28/24 1307   fluconazole (DIFLUCAN) tablet 100 mg  100 mg Oral Q1400 Ravishankar, Jayashree, MD   100 mg at 06/28/24 1446   folic acid  (FOLVITE ) tablet 1 mg  1 mg Oral Daily Agbata, Tochukwu, MD   1 mg at 06/28/24 1241   hydrOXYzine  (ATARAX ) tablet 25 mg  25 mg Oral Q6H PRN Melvin, Alexander B, MD   25 mg at 06/24/24 2135   leptospermum manuka honey (MEDIHONEY) paste 1 Application  1 Application Topical Daily Agbata, Tochukwu, MD   1 Application at 06/28/24 1431   liver oil-zinc oxide (DESITIN) 40 % ointment   Topical BID Agbata, Tochukwu, MD   Given at 06/27/24 2050   mirtazapine  (REMERON ) tablet 30 mg  30 mg Oral QHS Agbata, Tochukwu, MD   30 mg at 06/27/24 2342   montelukast  (SINGULAIR ) tablet 10 mg  10 mg Oral QHS  Melvin, Alexander B, MD   10 mg at 06/27/24 2342   morphine  (MS CONTIN ) 12 hr tablet 15 mg  15 mg Oral Q12H Perlie Stene R, NP   15 mg at 06/28/24 1239   morphine  (PF) 2 MG/ML injection 2 mg  2 mg Intravenous Q4H PRN Agbata, Tochukwu, MD   2 mg at 06/27/24 2036   oxyCODONE  (Oxy IR/ROXICODONE ) immediate release tablet 5-10 mg  5-10 mg Oral Q4H PRN Melvin, Alexander B, MD   10 mg at 06/28/24 9049   pantoprazole  (PROTONIX ) EC tablet 40 mg  40 mg Oral Daily PRN Melvin, Alexander B, MD       polyethylene glycol (MIRALAX  / GLYCOLAX ) packet 17 g  17 g Oral Daily PRN Melvin, Alexander B, MD       polyethylene glycol (MIRALAX  / GLYCOLAX ) packet 17 g  17 g Oral Daily Agbata, Tochukwu, MD   17 g at 06/25/24 1239   pregabalin  (LYRICA ) capsule 100 mg  100 mg Oral BID Melvin, Alexander B, MD   100 mg at 06/28/24 1240   senna-docusate (Senokot-S) tablet 2 tablet  2 tablet Oral QHS Agbata, Tochukwu, MD   2 tablet at 06/25/24 2205   sodium chloride  flush (NS) 0.9 % injection 10-40 mL  10-40 mL Intracatheter  Q12H Agbata, Tochukwu, MD   10 mL at 06/27/24 2051   sodium chloride  flush (NS) 0.9 % injection 10-40 mL  10-40 mL Intracatheter PRN Agbata, Tochukwu, MD       sodium chloride  flush (NS) 0.9 % injection 3 mL  3 mL Intravenous Q12H Seena Marsa NOVAK, MD   3 mL at 06/27/24 2051   triamcinolone  cream (KENALOG ) 0.1 % cream   Topical BID Patel, Sona, MD   Given at 06/28/24 1431    VITAL SIGNS: BP 136/71 (BP Location: Left Leg)   Pulse 78   Temp 98.7 F (37.1 C) (Oral)   Resp 16   Ht 5' 8 (1.727 m)   Wt 181 lb (82.1 kg)   SpO2 98%   BMI 27.52 kg/m  Filed Weights   06/23/24 1149  Weight: 181 lb (82.1 kg)    Estimated body mass index is 27.52 kg/m as calculated from the following:   Height as of this encounter: 5' 8 (1.727 m).   Weight as of this encounter: 181 lb (82.1 kg).  LABS: CBC:    Component Value Date/Time   WBC 5.2 06/26/2024 0530   HGB 10.7 (L) 06/26/2024 0530   HGB 12.3 (L) 06/22/2024 0904   HCT 33.3 (L) 06/26/2024 0530   PLT 263 06/26/2024 0530   PLT 307 06/22/2024 0904   MCV 98.8 06/26/2024 0530   NEUTROABS 4.4 06/23/2024 1151   LYMPHSABS 0.6 (L) 06/23/2024 1151   MONOABS 0.6 06/23/2024 1151   EOSABS 0.0 06/23/2024 1151   BASOSABS 0.1 06/23/2024 1151   Comprehensive Metabolic Panel:    Component Value Date/Time   NA 138 06/26/2024 0530   K 3.7 06/26/2024 0530   CL 106 06/26/2024 0530   CO2 22 06/26/2024 0530   BUN 14 06/26/2024 0530   CREATININE 0.76 06/26/2024 0530   CREATININE 1.19 06/22/2024 0904   GLUCOSE 87 06/26/2024 0530   CALCIUM  8.2 (L) 06/26/2024 0530   AST 18 06/24/2024 0525   AST 25 06/22/2024 0904   ALT 14 06/24/2024 0525   ALT 15 06/22/2024 0904   ALKPHOS 41 06/24/2024 0525   BILITOT 0.9 06/24/2024 0525   BILITOT 0.7 06/22/2024 0904   PROT 5.2 (  L) 06/24/2024 0525   ALBUMIN 2.2 (L) 06/24/2024 0525    RADIOGRAPHIC STUDIES: CT CHEST WO CONTRAST Result Date: 06/27/2024 CLINICAL DATA:  Follow-up of pulmonary nodules. History of  T-cell lymphoma. * Tracking Code: BO * EXAM: CT CHEST WITHOUT CONTRAST TECHNIQUE: Multidetector CT imaging of the chest was performed following the standard protocol without IV contrast. RADIATION DOSE REDUCTION: This exam was performed according to the departmental dose-optimization program which includes automated exposure control, adjustment of the mA and/or kV according to patient size and/or use of iterative reconstruction technique. COMPARISON:  PET of 04/03/2024.  Chest CT of 02/01/2024 FINDINGS: Cardiovascular: Right Port-A-Cath tip high right atrium. Aortic atherosclerosis. Mild cardiomegaly. Left main and 3 vessel coronary artery calcification. Mediastinum/Nodes: No supraclavicular adenopathy. No mediastinal or hilar adenopathy, given limitations of unenhanced CT. Lungs/Pleura: New small left and trace right pleural effusions. Index posterior left upper lobe nodule measures 2.8 by 2.5 cm on 58/3. Compare 2.0 x 2.1 cm on the prior PET. Anterior right lower lobe lung mass measures 3.3 x 2.9 cm on 88/3 versus 2.2 x 2.1 cm on the prior PET (when remeasured). Right middle lobe 2.7 x 2.5 cm nodule on image 100/3, increased from 1.1 x 1.0 cm on the prior PET (when remeasured). Upper Abdomen: Multiple dependent gallstones. Subcentimeter segment 2 hepatic cyst. Normal imaged portions of the spleen, stomach, pancreas, adrenal glands, kidneys. Artifact degradation secondary to lumbar spine fixation. No upper abdominal adenopathy identified. Musculoskeletal: Posterior left chest wall subcutaneous 2.3 cm lesion on 101/2, likely present on the prior PET. Upper lumbar spine fixation. IMPRESSION: 1. Moderate enlargement of pulmonary nodules since the PET of 04/03/2024, presumably progression of lymphoma. 2. No thoracic adenopathy. 3. New tiny bilateral pleural effusions. 4. Posterior chest wall subcutaneous lesion is likely similar to on the prior PET. Given findings on that exam, likely related to cutaneous T-cell  lymphoma. 5. Incidental findings, including: Coronary artery atherosclerosis. Aortic Atherosclerosis (ICD10-I70.0). Cholelithiasis. Electronically Signed   By: Rockey Kilts M.D.   On: 06/27/2024 18:20   DG Chest Portable 1 View Result Date: 06/23/2024 CLINICAL DATA:  Cough, fever, history of mycosis fungoides / T-cell lymphoma EXAM: PORTABLE CHEST 1 VIEW COMPARISON:  10/28/2023, 04/03/2024 FINDINGS: Single frontal view of the chest demonstrates right chest wall port via internal jugular approach, tip overlying superior vena cava. The cardiac silhouette is unremarkable. There are multiple bilateral pulmonary nodules, largest in the right lower lung zone measuring 3.6 cm increased since prior PET scan. No acute airspace disease, effusion, or pneumothorax. No acute bony abnormalities. IMPRESSION: 1. Enlarging pulmonary nodules consistent with progression of known pulmonary metastases. 2. No acute airspace disease. Electronically Signed   By: Ozell Daring M.D.   On: 06/23/2024 14:59    PERFORMANCE STATUS (ECOG) : 3 - Symptomatic, >50% confined to bed  Review of Systems Unless otherwise noted, a complete review of systems is negative.  Physical Exam General: NAD Cardiovascular: regular rate and rhythm Pulmonary: clear ant fields Abdomen: soft, nontender, + bowel sounds GU: no suprapubic tenderness Extremities: no edema, no joint deformities Skin: Multiple skin lesions Neurological: Weakness but otherwise nonfocal  IMPRESSION: Follow-up visit.  Patient drowsy but wakes with stimulation.  Still having pain and discomfort when awake.  Will discontinue IV morphine .  Continue MS Contin /oxycodone .  Met with patient's wife.  She also spoke with Dr. Rennie and was informed that patient does not have viable options left to treat his cancer.  Hospice was recommended.  Wife says that she is interested  in taking patient home with hospice.  Will have hospice liaison speak with her to  coordinate.  Wife says that she plans to speak with patient about CODE STATUS.  PLAN: - Recommend best supportive care - Home with hospice - Continue MS Contin /oxycodone  - DC IV morphine  - Daily bowel regimen - Wife speaking with patient about CODE STATUS  Case and plan discussed with Dr. Rennie  Time Total: 35 minutes  Visit consisted of counseling and education dealing with the complex and emotionally intense issues of symptom management and palliative care in the setting of serious and potentially life-threatening illness.Greater than 50%  of this time was spent counseling and coordinating care related to the above assessment and plan.  Signed by: Fonda Mower, PhD, NP-C

## 2024-06-28 NOTE — Progress Notes (Signed)
 [facility]  - [place] at [facility]   PATIENT NAME: Manuel Cook    MR#:  [id number]  DATE OF BIRTH:  May 19, 1946  SUBJECTIVE:  patient seen earlier. He was very tired and sleeping after IV morphine  and oral [place]. Spoke with wife at bedside. Patient is having difficulty with pain in his genital area due to skin tears from the large lesions. Palliative care is working with patient regarding pain meds    VITALS:  Blood pressure 136/71, pulse 78, temperature 98.7 F (37.1 C), temperature source Oral, resp. rate 16, height 5' 8 (1.727 m), weight 82.1 kg, SpO2 98%.  PHYSICAL EXAMINATION:   GENERAL:  78 y.o.-year-old patient with no acute distress.  LUNGS: Normal breath sounds bilaterally, no wheezing CARDIOVASCULAR: S1, S2 normal. No murmur   ABDOMEN: Soft, nontender, nondistended. Bowel sounds present.  EXTREMITIES: No  edema b/l.    SKIN:      LABORATORY PANEL:  CBC Recent Labs  Lab 06/26/24 0530  WBC 5.2  HGB 10.7*  HCT 33.3*  PLT 263    Chemistries  Recent Labs  Lab 06/24/24 0525 06/25/24 0941 06/26/24 0530  NA 137  --  138  K 3.6  --  3.7  CL 104  --  106  CO2 26  --  22  GLUCOSE 95  --  87  BUN 14  --  14  CREATININE 0.84   < > 0.76  CALCIUM  8.2*  --  8.2*  AST 18  --   --   ALT 14  --   --   ALKPHOS 41  --   --   BILITOT 0.9  --   --    < > = values in this interval not displayed.   Assessment and Plan Manuel Cook is a 78 y.o. male with medical history significant of hypertension, hyperlipidemia, BPH, CAD status post stent, OSA, cutaneous T-cell lymphoma, C-spine fusion, lumbar surgery presenting with worsening wounds.   Patient is followed by [facility] wound care nurse due to his chronic wounds from his T-cell lymphoma.  Has a worsening urinalysis.  The patient is becoming infected.  Purulent drainage.  Cutaneous T-cell lymphoma stage IV/Mycosis fungoides significant pain/chronic pain syndrome --Patient with a history of cutaneous  T-cell lymphoma/mycosis fungoides with multiple skin lesions --He has multiple wounds but the wound over his right hip has increased purulence and he had subjective fevers at home with a T max of 100.9 --Patient has no leukocytosis but given his immunocompromised status and severity of his lesions started on IV antibiotics in the ED  -- ID consult appreciated. Per Dr. Searcy antibiotics don't play a role. He finish course of it. -- Per wife patient was prescribed triamcinolone  acetate cream through [facility] dermatology will use that  --Patient currently status post multiple lines of therapy -most recently noted to have  progression with new tumors despite palliative chemotherapy with bexarotene ,leucovorin . and Pralatrexate   --- patient followed by Dr. Rennie.  --Palliative care consultation appreciated for pain management-- palliative care spoke with patient's wife and she is requesting hospice to evaluate   Hypertension --Blood pressure is stable atenolol  and amlodipine   Hyperlipidemia --Continue atorvastatin    CAD --Continue atenolol  and  atorvastatin    OSA - Not on CPAP   Anemia of chronic disease H&H is stable       Procedures: Family communication : wife at bedside Consults : oncology CODE STATUS: full DVT Prophylaxis : enoxaparin  Level of care: Telemetry Medical Status is:  Inpatient Remains inpatient appropriate because: control of pain with IV NPO pain meds. Palliative care nurse practitioner    TOTAL TIME TAKING CARE OF THIS PATIENT: 35 minutes.  >50% time spent on counselling and coordination of care  Note: This dictation was prepared with Dragon dictation along with smaller phrase technology. Any transcriptional errors that result from this process are unintentional.  Leita Blanch M.D    [facility]   CC: Primary care physician; Sadie Manna, MD

## 2024-06-28 NOTE — TOC Initial Note (Signed)
 Transition of Care [facility]) - Initial/Assessment Note    Patient Details  Name: Manuel Cook MRN: [id number] Date of Birth: August 15, 1946  [facility] of [facility]) CM/SW Contact:    Corean ONEIDA Haddock, RN Phone Number: 06/28/2024, 11:57 AM  Clinical Narrative:                    Met with patient and wife at bedside to discuss disposition.   Patient dozes on and off throughout conversation.  States that he received some medication that has made him tired  Wife states that he goes to outpatient wound care on Mondays and Fridays, and goes to outpatient Chemo on Thursdays.  She states that she provides transportation to appointments  Therapy currently recommending SNF.  Initially patient states that he would be interested in SNF, but would not be willing to put his chemo on hold  Patient dozes off again.  Wife states her preference would be home with home health services and outpatient palliative.  She request that I return tomorrow.  MD, oncology, and palliative updated     Update:  Josh with Palliative reached out to [facility] with [facility] for home hospice referral   Patient Goals and CMS Choice            Expected Discharge Plan and Services                                              Prior Living Arrangements/Services                       Activities of Daily Living   ADL Screening (condition at time of admission) Independently performs ADLs?: Yes (appropriate for developmental age) Is the patient deaf or have difficulty hearing?: Yes Does the patient have difficulty seeing, even when wearing glasses/contacts?: Yes Does the patient have difficulty concentrating, remembering, or making decisions?: No  Permission Sought/Granted                  Emotional Assessment              Admission diagnosis:  Wound infection [T14.8XXA, L08.9] Patient Active Problem List   Diagnosis Date Noted   Palliative care encounter 06/27/2024    Generalized pain 06/27/2024   Wound infection 06/23/2024   Degenerative lumbar spinal stenosis 04/08/2021   OSA (obstructive sleep apnea) 04/11/2020   Surgery follow-up examination 07/03/2019   Paronychia of great toe of left foot 06/22/2019   Benign essential hypertension 04/06/2018   Lower urinary tract infectious disease 05/01/2015   BPH with obstruction/lower urinary tract symptoms 05/01/2015   Microscopic hematuria 05/01/2015   Atherosclerotic heart disease of native coronary artery without angina pectoris 02/20/2015   Osteoarthritis 03/12/2014   Mycosis fungoides, lymph nodes of multiple sites (HCC) 05/24/2012   Cervical vertebral fusion 05/03/2012   Hyperlipidemia 05/03/2012   Presence of coronary angioplasty implant and graft 05/03/2012   Status post lumbar spine operation 05/03/2012   Plantar fasciitis 04/28/2011   PCP:  Sadie Manna, MD Pharmacy:   [facility] [id number] [address] [place], [place] - [address] [place] [address] Phone: [phone] Fax: [phone]  [facility] - [place], [place] - [address] [place] [address] Phone: [phone] Fax: [phone]  [facility] [address]  [address] [place] [address] Phone: [phone] Fax: [phone]     Social Drivers of Health (SDOH) Social History: SDOH Screenings   Food Insecurity: No Food Insecurity (06/23/2024)  Housing: Low Risk  (06/23/2024)  Transportation Needs: No Transportation Needs (06/23/2024)  Utilities: Not At Risk (06/23/2024)  Depression (PHQ2-9): High Risk (06/22/2024)  Financial Resource Strain: Low Risk  (01/18/2024)   Received from [facility]  Social Connections: Moderately Integrated (06/23/2024)  Stress: No Stress Concern Present (06/18/2022)  Tobacco Use: Medium Risk (06/23/2024)  Health Literacy: Adequate Health Literacy (04/20/2024)   SDOH Interventions:      Readmission Risk Interventions     No data to display

## 2024-06-28 NOTE — Evaluation (Signed)
 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  Oriented to self, location, date and general situation; follows simple commands, but does require increased encouragement/cuing for active participation with session.  Endorses generalized pain throughout all extremities and scrotum; very limited tolerance for movement, touch from therapist. Globally weak and deconditioned throughout all extremities, with strength grossly at least 3-/5 throughout.  Completed partial rolling, repositioning, scooting up in bed, dep assist +2.  Very limited ability to actively assist, limited largely by pain.  Did attempt transition towards edge of bed; patient unable to tolerate due to pain in extremities and scrotum with even minimal movement attempts.  Did ultimately transition to chair position in bed for repositioning, pressure relief and progressive tolerance to upright; set up with meal tray. Wife at bedside.  Tolerating fairly. Will continue to assess/progress mobility as appropriate in subsequent sessions, coordinating with pain meds as able. Would benefit from skilled PT to address above deficits and promote optimal return to PLOF.; recommend post-acute PT follow up as indicated by interdisciplinary care team.          If plan is discharge home, recommend the following: Two people to help with walking and/or transfers;Two people to help with bathing/dressing/bathroom   Can travel by private vehicle        Equipment Recommendations    Recommendations for Other Services       Functional Status Assessment Patient has had a recent decline in their functional status and  demonstrates the ability to make significant improvements in function in a reasonable and predictable amount of time.     Precautions / Restrictions Precautions Precautions: Fall Precaution/Restrictions Comments: multiple, scattered wounds Restrictions Weight Bearing Restrictions Per Provider Order: No      Mobility  Bed Mobility Overal bed mobility: Needs Assistance             General bed mobility comments: rolling, repositioning, scooting up in bed, dep assist +2.  Very limited ability to actively assist, limited largely by pain    Transfers                   General transfer comment: attempted transition towards edge of bed; patient unable to tolerate due to pain in extremities and scrotum with even minimal movement attempts    Ambulation/Gait               General Gait Details: unsafe/unable to tolerate  Stairs            Wheelchair Mobility     Tilt Bed    Modified Rankin (Stroke Patients Only)       Balance                                             Pertinent Vitals/Pain Pain Assessment Pain Assessment: Faces Faces Pain Scale: Hurts whole lot Pain Location: bilat UE/LEs, scrotum Pain Descriptors / Indicators: Guarding, Grimacing, Aching Pain Intervention(s): Limited activity within patient's tolerance, Monitored during session, Repositioned    Home Living Family/patient expects to be discharged to:: Private residence Living Arrangements: Spouse/significant other Available Help at  Discharge: Family Type of Home: House Home Access: Stairs to enter Entrance Stairs-Rails: Right Entrance Stairs-Number of Steps: 2-3 Alternate Level Stairs-Number of Steps: 10-12 Home Layout: Multi-level (endorses bed/bathroom on 'second level' (?main level?))        Prior Function Prior Level of Function : Independent/Modified Independent             Mobility Comments: Per patient, indep with household mobilization at  baseline; denies fall history.       Extremity/Trunk Assessment   Upper Extremity Assessment Upper Extremity Assessment: Generalized weakness (grossly at least 3-/5 throughout; scattered wounds throughout, very limited tolerance for touch/external assist from therapist)    Lower Extremity Assessment Lower Extremity Assessment: Generalized weakness (grossly at least 3-/5 throughout; scattered wounds throughout, very limited tolerance for touch/external assist from therapist)       Communication        Cognition Arousal: Lethargic Behavior During Therapy: Flat affect, Agitated                           PT - Cognition Comments: generally frustrated with therapist attempts, poor tolerance for activity due to pain; requires increased time/encouragement for attempts at any/all movement         Cueing       General Comments      Exercises Other Exercises Other Exercises: Transitioned to chair position for repositioning and progressive tolerance to upright; tolerating well. Set up with breakfast tray; wife at bedside to assist as needed.   Assessment/Plan    PT Assessment Patient needs continued PT services  PT Problem List Decreased strength;Decreased range of motion;Decreased activity tolerance;Decreased balance;Decreased mobility;Decreased coordination;Decreased knowledge of use of DME;Decreased safety awareness;Decreased knowledge of precautions;Decreased skin integrity;Pain       PT Treatment Interventions DME instruction;Gait training;Stair training;Functional mobility training;Therapeutic activities;Therapeutic exercise;Balance training;Cognitive remediation;Patient/family education    PT Goals (Current goals can be found in the Care Plan section)  Acute Rehab PT Goals Patient Stated Goal: to make the pain better PT Goal Formulation: With patient Time For Goal Achievement: 07/12/24 Potential to Achieve Goals: Fair    Frequency Min 2X/week      Co-evaluation               AM-PAC PT 6 Clicks Mobility  Outcome Measure Help needed turning from your back to your side while in a flat bed without using bedrails?: Total Help needed moving from lying on your back to sitting on the side of a flat bed without using bedrails?: Total Help needed moving to and from a bed to a chair (including a wheelchair)?: Total Help needed standing up from a chair using your arms (e.g., wheelchair or bedside chair)?: Total Help needed to walk in hospital room?: Total Help needed climbing 3-5 steps with a railing? : Total 6 Click Score: 6    End of Session   Activity Tolerance: Patient tolerated treatment well Patient left: with call bell/phone within reach;in bed;with bed alarm set Nurse Communication: Mobility status PT Visit Diagnosis: Muscle weakness (generalized) (M62.81);Difficulty in walking, not elsewhere classified (R26.2)    Time: 9160-9093 PT Time Calculation (min) (ACUTE ONLY): 27 min   Charges:   PT Evaluation $PT Eval Moderate Complexity: 1 Mod   PT General Charges $$ ACUTE PT VISIT: 1 Visit         Katlin Bortner H. Delores, PT, DPT, NCS 06/28/24, 9:25 AM [phone]

## 2024-06-28 NOTE — Plan of Care (Signed)

## 2024-06-28 NOTE — Progress Notes (Signed)
 Manuel Cook   DOB:[birth year]   FM#:[id number]    Subjective: No acute events overnight.  However continues to have significant pain needing IV narcotics.  Patient currently groggy and sleeping.  Wife by the bedside.  Objective:  Vitals:   06/28/24 1507 06/28/24 2024  BP: 136/71 (!) 142/76  Pulse: 78 89  Resp: 16 16  Temp: 98.7 F (37.1 C) 100.1 F (37.8 C)  SpO2: 98% 98%     Intake/Output Summary (Last 24 hours) at 06/28/2024 2230 Last data filed at 06/28/2024 0120 Gross per 24 hour  Intake --  Output 300 ml  Net -300 ml    Physical Exam Vitals and nursing note reviewed.  HENT:     Head: Normocephalic and atraumatic.     Mouth/Throat:     Pharynx: Oropharynx is clear.  Eyes:     Extraocular Movements: Extraocular movements intact.     Pupils: Pupils are equal, round, and reactive to light.  Cardiovascular:     Rate and Rhythm: Normal rate and regular rhythm.  Pulmonary:     Comments: Decreased breath sounds bilaterally.  Abdominal:     Palpations: Abdomen is soft.  Musculoskeletal:        General: Normal range of motion.     Cervical back: Normal range of motion.  Skin:    Findings: Erythema, lesion and rash present.  Neurological:     General: No focal deficit present.     Mental Status: He is oriented to person, place, and time.      Labs:  Lab Results  Component Value Date   WBC 5.2 06/26/2024   HGB 10.7 (L) 06/26/2024   HCT 33.3 (L) 06/26/2024   MCV 98.8 06/26/2024   PLT 263 06/26/2024   NEUTROABS 4.4 06/23/2024    Lab Results  Component Value Date   NA 138 06/26/2024   K 3.7 06/26/2024   CL 106 06/26/2024   CO2 22 06/26/2024    Studies:  CT CHEST WO CONTRAST Result Date: 06/27/2024 CLINICAL DATA:  Follow-up of pulmonary nodules. History of T-cell lymphoma. * Tracking Code: BO * EXAM: CT CHEST WITHOUT CONTRAST TECHNIQUE: Multidetector CT imaging of the chest was performed following the standard protocol without IV contrast. RADIATION DOSE  REDUCTION: This exam was performed according to the departmental dose-optimization program which includes automated exposure control, adjustment of the mA and/or kV according to patient size and/or use of iterative reconstruction technique. COMPARISON:  PET of 04/03/2024.  Chest CT of 02/01/2024 FINDINGS: Cardiovascular: Right Port-A-Cath tip high right atrium. Aortic atherosclerosis. Mild cardiomegaly. Left main and 3 vessel coronary artery calcification. Mediastinum/Nodes: No supraclavicular adenopathy. No mediastinal or hilar adenopathy, given limitations of unenhanced CT. Lungs/Pleura: New small left and trace right pleural effusions. Index posterior left upper lobe nodule measures 2.8 by 2.5 cm on 58/3. Compare 2.0 x 2.1 cm on the prior PET. Anterior right lower lobe lung mass measures 3.3 x 2.9 cm on 88/3 versus 2.2 x 2.1 cm on the prior PET (when remeasured). Right middle lobe 2.7 x 2.5 cm nodule on image 100/3, increased from 1.1 x 1.0 cm on the prior PET (when remeasured). Upper Abdomen: Multiple dependent gallstones. Subcentimeter segment 2 hepatic cyst. Normal imaged portions of the spleen, stomach, pancreas, adrenal glands, kidneys. Artifact degradation secondary to lumbar spine fixation. No upper abdominal adenopathy identified. Musculoskeletal: Posterior left chest wall subcutaneous 2.3 cm lesion on 101/2, likely present on the prior PET. Upper lumbar spine fixation. IMPRESSION: 1. Moderate enlargement of pulmonary  nodules since the PET of 04/03/2024, presumably progression of lymphoma. 2. No thoracic adenopathy. 3. New tiny bilateral pleural effusions. 4. Posterior chest wall subcutaneous lesion is likely similar to on the prior PET. 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 5. Incidental findings, including: Coronary artery atherosclerosis. Aortic Atherosclerosis (ICD10-I70.0). Cholelithiasis. Electronically Signed   By: Rockey Kilts M.D.   On: 06/27/2024 18:20     No problem-specific Assessment & Plan notes found for this encounter.  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  CT scan shows progressive lung metastases  # Pain secondary ongoing malignancy  # Wound infection on antibiotics  Recommendations/plan:  # I had a long discussion with the patient's regarding the progressive disease and the fact the patient unfortunately has limited options of chemotherapy-which I think the risk of the treatments outweigh the benefits.  After lengthy discussion back-and-forth-wife seems to be in agreement at this time patient's pain management/comfort is of most importance.  I think patient is high risk of complications/mortality from any subsequent chemotherapy.  She seems to be understanding.  Had a long discussion regarding hospice.   I think is reasonable to discontinue bexarotene  given the progression of disease  # Also discussed with [employer].  Also discussed with Dr. Tobie.  # I reviewed the blood work- with the patient's in detail; also reviewed the imaging independently [as summarized above]; and with the patient's wife in detail.    Cindy JONELLE Joe, MD 06/28/2024  10:30 PM

## 2024-06-28 NOTE — Progress Notes (Signed)
 [facility]  [facility] Liaison Note Received a referral for hospice services at home after discharge from [facility]. Spoke with Candis, spouse, on the telephone to initiate education related to hospice philosophy, services and team approach to care. Family verbalized understanding of information given.  Per discussion, the plan is for discharge is ongoing. Jaydence Arnesen, spouse, [phone] is the family contact. Please send signed and completed DNR home with the patient.  Please provide prescriptions at discharge as needed to ensure ongoing symptom management. [facility] information and contact numbers given to [place].  Above information shared with Transitions of Care Manager. Please call with any questions or concerns.  Thank you for the opportunity to participate in this patient's care.  Inocente Jacobs, BSN, RN [employer] [phone]

## 2024-06-29 DIAGNOSIS — T148XXA Other injury of unspecified body region, initial encounter: Secondary | ICD-10-CM | POA: Diagnosis not present

## 2024-06-29 DIAGNOSIS — L089 Local infection of the skin and subcutaneous tissue, unspecified: Secondary | ICD-10-CM | POA: Diagnosis not present

## 2024-06-29 MED ORDER — MORPHINE SULFATE ER 15 MG PO TBCR: 30.0000 mg | EXTENDED_RELEASE_TABLET | Freq: Two times a day (BID) | ORAL | Status: DC

## 2024-06-29 MED ORDER — ZINC OXIDE 40 % EX OINT: TOPICAL_OINTMENT | Freq: Two times a day (BID) | CUTANEOUS | 0 refills | Status: DC

## 2024-06-29 MED ORDER — MORPHINE SULFATE ER 30 MG PO TBCR: 30.0000 mg | EXTENDED_RELEASE_TABLET | Freq: Two times a day (BID) | ORAL | 0 refills | Status: DC

## 2024-06-29 MED ORDER — AMOXICILLIN-POT CLAVULANATE 875-125 MG PO TABS: 1.0000 | ORAL_TABLET | Freq: Two times a day (BID) | ORAL | 0 refills | Status: AC

## 2024-06-29 MED ORDER — ADULT MULTIVITAMIN W/MINERALS CH: 1.0000 | ORAL_TABLET | Freq: Every day | ORAL | 0 refills | Status: DC

## 2024-06-29 MED ORDER — FLUCONAZOLE 100 MG PO TABS: 100.0000 mg | ORAL_TABLET | Freq: Every day | ORAL | 0 refills | Status: AC

## 2024-06-29 MED ORDER — HEPARIN SOD (PORK) LOCK FLUSH 100 UNIT/ML IV SOLN
500.0000 [IU] | INTRAVENOUS | Status: AC | PRN
  Administered 2024-06-29: 500 [IU]

## 2024-06-29 MED ORDER — MEDIHONEY WOUND/BURN DRESSING EX PSTE: 1.0000 | PASTE | Freq: Every day | CUTANEOUS | 0 refills | Status: DC

## 2024-06-29 MED ORDER — TRIAMCINOLONE ACETONIDE 0.1 % EX CREA: TOPICAL_CREAM | Freq: Two times a day (BID) | CUTANEOUS | 0 refills | Status: DC

## 2024-06-29 MED ORDER — OXYCODONE HCL 10 MG PO TABS: 5.0000 mg | ORAL_TABLET | ORAL | 0 refills | Status: DC | PRN

## 2024-06-29 MED ORDER — POLYETHYLENE GLYCOL 3350 17 G PO PACK: 17.0000 g | PACK | Freq: Every day | ORAL | 0 refills | Status: DC

## 2024-06-29 NOTE — Progress Notes (Signed)
 Mobility Specialist - Progress Note   06/29/24 1136  Mobility  Activity Stood at bedside;Dangled on edge of bed  Level of Assistance Moderate assist, patient does 50-74%  Assistive Device Front wheel walker  Distance Ambulated (ft) 2 ft  Activity Response Tolerated well  Mobility visit 1 Mobility  Mobility Specialist Start Time (ACUTE ONLY) 1103  Mobility Specialist Stop Time (ACUTE ONLY) 1134  Mobility Specialist Time Calculation (min) (ACUTE ONLY) 31 min   Pt supine upon entry, utilizing RA-- expressed burning feeling of the LLE. Pt agreeable to mobility this date, however required encouragement throughout session d/t scrotum pain--- recently received medication. Pt completed bed mob ModA +2, dangled EOB for ~ 3 mins before STS to RW (elevated bed height) Mod-MinA. Pt stood at bedside for ~ 4 mins before returning EOB while NT completed linen change. Pt STS to RW MinA, requiring less assistance. Pt took two lateral steps towards the [facility] before returning EOB. Pt left supine with alarm set and needs within reach.  America Silvan Mobility Specialist 06/29/24 12:06 PM

## 2024-06-29 NOTE — Progress Notes (Signed)
 [facility] Room [facility]ice Liaison Note  [facility] will have a [facility] and shower chair delivered to the patient's home today.  Patient does not want a hospital bed at this time.  Please call with any hospice related questions or concerns  Thank you for the opportunity to participate in this patient's care.  [facility] Liasion  [phone]

## 2024-06-29 NOTE — Progress Notes (Signed)
 [facility]  - [place] at [facility]   PATIENT NAME: Manuel Cook    MR#:  [id number]  DATE OF BIRTH:  [birth year]  SUBJECTIVE:  wife at bedside. Patient is resting quietly. He is worried about his pain management. Discussed with him I have increased his MS Contin  dose and will given between breakthrough OxyContin . He remains a full code. Discussed side effects of narcotics at the same time more to keep him comfortable.  VITALS:  Blood pressure 125/68, pulse 88, temperature 98.5 F (36.9 C), resp. rate 16, height 5' 8 (1.727 m), weight 82.1 kg, SpO2 98%.  PHYSICAL EXAMINATION:   GENERAL:  78 y.o.-year-old patient with no acute distress.  LUNGS: Normal breath sounds bilaterally, no wheezing CARDIOVASCULAR: S1, S2 normal. No murmur   ABDOMEN: Soft, nontender, nondistended. Bowel sounds present.  EXTREMITIES: No  edema b/l.    SKIN:      LABORATORY PANEL:  CBC Recent Labs  Lab 06/26/24 0530  WBC 5.2  HGB 10.7*  HCT 33.3*  PLT 263    Chemistries  Recent Labs  Lab 06/24/24 0525 06/25/24 0941 06/26/24 0530  NA 137  --  138  K 3.6  --  3.7  CL 104  --  106  CO2 26  --  22  GLUCOSE 95  --  87  BUN 14  --  14  CREATININE 0.84   < > 0.76  CALCIUM  8.2*  --  8.2*  AST 18  --   --   ALT 14  --   --   ALKPHOS 41  --   --   BILITOT 0.9  --   --    < > = values in this interval not displayed.   Assessment and Plan ZIYAD DYAR is a 78 y.o. male with medical history significant of hypertension, hyperlipidemia, BPH, CAD status post stent, OSA, cutaneous T-cell lymphoma, C-spine fusion, lumbar surgery presenting with worsening wounds.   Patient is followed by [facility] wound care nurse due to his chronic wounds from his T-cell lymphoma.  Has a worsening urinalysis.  The patient is becoming infected.  Purulent drainage.  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 Per Dr. Searcy antibiotics don't play a role. He finish course of it. -- Per wife patient was prescribed triamcinolone  acetate cream through [facility] dermatology will use that  --Patient currently status post multiple lines of therapy -most recently noted to have  progression with new tumors despite palliative chemotherapy with bexarotene ,leucovorin . and Pralatrexate   --- patient followed by Dr. Rennie.  --Palliative care consultation appreciated for pain management-- palliative care spoke with patient's wife and she is requesting hospice to evaluate -- Dr. Rennie and Sidra Borders has discussed with patient and wife regarding Limited options and consider hospice with focusing on pain management which wife and patient agreeable.  -- Hospice evaluation done. DME has been ordered.   Hypertension --Blood pressure is stable atenolol  and amlodipine   Hyperlipidemia --Continue atorvastatin    CAD --Continue atenolol  and  atorvastatin    OSA - Not on CPAP   Anemia of chronic  disease H&H is stable       Procedures: Family communication : wife at bedside Consults : oncology CODE STATUS: full DVT Prophylaxis : enoxaparin  Level of care: Telemetry Medical Status is: Inpatient Remains inpatient appropriate because: control of pain with IV NPO pain meds. Palliative care nurse practitioner    TOTAL TIME TAKING CARE OF THIS PATIENT: 35 minutes.  >50% time spent on counselling and coordination of care  Note: This dictation was prepared with Dragon dictation along with smaller phrase technology. Any transcriptional errors that result from  this process are unintentional.  Leita Blanch M.D    [facility]   CC: Primary care physician; Sadie Manna, MD

## 2024-06-29 NOTE — Discharge Summary (Signed)
 Physician Discharge Summary   Patient: Manuel Cook MRN: [id number] DOB: 1946-05-24  Admit date:     06/23/2024  Discharge date: 06/29/24  Discharge Physician: Leita Blanch   PCP: Sadie Manna, MD   Recommendations at discharge:   [facility] to follow follow-up Dr. Rennie at the cancer center if need follow-up PCP if needed  Discharge Diagnoses: Principal Problem:   Wound infection Active Problems:   BPH with obstruction/lower urinary tract symptoms   Atherosclerotic heart disease of native coronary artery without angina pectoris   Hyperlipidemia   Mycosis fungoides, lymph nodes of multiple sites [facility])   Presence of coronary angioplasty implant and graft   Benign essential hypertension   OSA (obstructive sleep apnea)   Palliative care encounter   Generalized pain  Manuel Cook is a 78 y.o. male with medical history significant of hypertension, hyperlipidemia, BPH, CAD status post stent, OSA, cutaneous T-cell lymphoma, C-spine fusion, lumbar surgery presenting with worsening wounds.   Patient is followed by [facility] wound care nurse due to his chronic wounds from his T-cell lymphoma.  Has a worsening urinalysis.  The patient is becoming infected.  Purulent drainage.   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 Per Dr. Searcy antibiotics don't play a role. He finish course of it. -- Per wife patient was prescribed triamcinolone  acetate cream through [facility] dermatology will use that  --Patient currently status post  multiple lines of therapy -most recently noted to have  progression with new tumors despite palliative chemotherapy with bexarotene ,leucovorin . and Pralatrexate   --- patient followed by Dr. Rennie.  --Palliative care consultation appreciated for pain management-- palliative care spoke with patient's wife and she is requesting hospice to evaluate -- Dr. Rennie and Sidra Borders has discussed with patient and wife regarding Limited options and consider hospice with focusing on pain management which wife and patient agreeable. Okay to discontinue chemo meds per Dr. Rennie -- Hospice evaluation completed DME has been ordered. -- Patient and wife understand poor prognosis   Hypertension --Blood pressure is stable atenolol  and amlodipine   Hyperlipidemia --Continue atorvastatin    CAD -- stable continue meds as above   OSA - Not on CPAP   Anemia of chronic disease H&H is stable     overall challenging issues with pain control. Prescribed MS Contin  and oxycodone  for breakthrough pain. Hospice evaluation completed and will follow at home. Poor prognosis. Discussed at length with wife     Procedures: Family communication : wife at bedside Consults : oncology CODE STATUS: full DVT Prophylaxis : enoxaparin      Pain control - [place]  Controlled Substance Reporting System database was reviewed. and patient was instructed, not to drive, operate heavy machinery, perform activities at heights, swimming or participation in water  activities or provide baby-sitting services while on Pain, Sleep and Anxiety Medications; until their outpatient Physician has advised to do so again. Also recommended to not to take more than prescribed Pain, Sleep and Anxiety Medications.   Disposition: Home Diet recommendation:  Discharge Diet Orders (From admission, onward)     Start     Ordered  06/29/24 0000  Diet - low sodium heart healthy        06/29/24 1317           Cardiac  diet DISCHARGE MEDICATION: Allergies as of 06/29/2024       Reactions   Sulfa Antibiotics    Unknown reaction        Medication List     STOP taking these medications    amLODipine 5 MG tablet Commonly known as: NORVASC   aprepitant  125 MG capsule Commonly known as: EMEND    aprepitant  80 MG capsule Commonly known as: EMEND    bexarotene  75 MG Caps capsule Commonly known as: TARGRETIN    doxycycline 100 MG capsule Commonly known as: VIBRAMYCIN   leucovorin  25 MG tablet Commonly known as: WELLCOVORIN    potassium chloride  SA 20 MEQ tablet Commonly known as: KLOR-CON  M       TAKE these medications    acetaminophen  500 MG tablet Commonly known as: TYLENOL  Take 1,000 mg by mouth every 6 (six) hours as needed for moderate pain or headache.   amoxicillin-clavulanate 875-125 MG tablet Commonly known as: AUGMENTIN Take 1 tablet by mouth every 12 (twelve) hours for 4 days.   atenolol  25 MG tablet Commonly known as: TENORMIN  Take by mouth daily.   atorvastatin  20 MG tablet Commonly known as: LIPITOR Take 20 mg by mouth daily.   augmented betamethasone  dipropionate 0.05 % ointment Commonly known as: DIPROLENE -AF Apply topically 2 (two) times daily.   benzonatate  100 MG capsule Commonly known as: TESSALON  Take 2 capsules (200 mg total) by mouth 3 (three) times daily as needed for cough.   clobetasol cream 0.05 % Commonly known as: TEMOVATE Apply 1 application topically 2 (two) times daily.   fluconazole 100 MG tablet Commonly known as: DIFLUCAN Take 1 tablet (100 mg total) by mouth daily at 2 PM for 2 days.   folic acid  1 MG tablet Commonly known as: FOLVITE  Take 1 tablet (1 mg total) by mouth daily.   furosemide  20 MG tablet Commonly known as: Lasix  Take 2 tablets (40 mg total) by mouth daily for 7 days. What changed: additional instructions   hydrOXYzine  25 MG tablet Commonly known as: ATARAX  Take 1 tablet (25 mg total) by mouth every 6 (six)  hours as needed for itching (may cause drowsiness).   leptospermum manuka honey Pste paste Apply 1 Application topically daily. Start taking on: June 30, 2024   lidocaine  5 % ointment Commonly known as: XYLOCAINE  Apply to painful areas every 3-4 hours as needed   lidocaine -prilocaine  cream Commonly known as: EMLA  Apply 1 Application topically as needed. Apply to port and cover with saran wrap 1-2 hours prior to port access   liver oil-zinc oxide 40 % ointment Commonly known as: DESITIN Apply topically 2 (two) times daily.   magic mouthwash (multi-ingredient) oral suspension Swish and swallow 5-10 mLs by mouth 4 (four) times daily as needed for mouth pain.   mirtazapine  30 MG tablet Commonly known as: Remeron  Take 1 tablet (30 mg total) by mouth at bedtime.   montelukast  10 MG tablet Commonly known as: Singulair  Take 1 tablet (10 mg total) by mouth at bedtime.   morphine  30 MG 12 hr tablet Commonly known as: MS CONTIN  Take 1 tablet (30 mg total) by mouth every 12 (twelve) hours.   multivitamin with minerals Tabs tablet Take 1 tablet by mouth daily. Start taking on: June 30, 2024   naloxone  4 MG/0.1ML Liqd nasal spray kit Commonly known as: NARCAN  SPRAY  1 SPRAY INTO ONE NOSTRIL AS DIRECTED FOR OPIOID OVERDOSE (TURN PERSON ON SIDE AFTER DOSE. IF NO RESPONSE IN 2-3 MINUTES OR PERSON RESPONDS BUT RELAPSES, REPEAT USING A NEW SPRAY DEVICE AND SPRAY INTO THE OTHER NOSTRIL. CALL 911 AFTER USE.) * EMERGENCY USE ONLY *   ondansetron  8 MG tablet Commonly known as: Zofran  Take 1 tablet (8 mg total) by mouth every 8 (eight) hours as needed for nausea or vomiting.   Oxycodone  HCl 10 MG Tabs Take 0.5 tablets (5 mg total) by mouth every 4 (four) hours as needed (pain). What changed: how much to take   pantoprazole  40 MG tablet Commonly known as: PROTONIX  Take 40 mg by mouth daily as needed (acid reflux).   polyethylene glycol 17 g packet Commonly known as: MIRALAX  /  GLYCOLAX  Take 17 g by mouth daily. Start taking on: June 30, 2024   pregabalin  100 MG capsule Commonly known as: Lyrica  Take 1 capsule (100 mg total) by mouth 2 (two) times daily.   prochlorperazine  10 MG tablet Commonly known as: COMPAZINE  Take 1 tablet (10 mg total) by mouth every 6 (six) hours as needed for nausea or vomiting.   triamcinolone  cream 0.1 % Commonly known as: KENALOG  Apply topically 2 (two) times daily.               Discharge Care Instructions  (From admission, onward)           Start     Ordered   06/29/24 0000  Discharge wound care:       Comments: 06/26/24 1145    Wound care  Until discontinued      Comments: 1. Apply Medihoney Q day to left hip, left and right flank, right elbow, right arm, left knee.  Cover with foam dressings.  Change foam dressings Q 3 days or PRN soiling 2. Apply Desitin to inner perineum and scrotum BID and PRN, leave open to air 3. Foam dressings to any other wounds which are draining. Change foam dressings Q 3 days or PRN soiling  Dry hard brown scab areas can remain open to air   06/29/24 1317            Follow-up Information     Sadie Manna, MD. Go to.   Specialty: Internal Medicine Why: If symptoms worsen, As needed Contact information: [address] [place] [address] [phone]         Rennie Cindy SAUNDERS, MD. Call.   Specialties: Internal Medicine, Oncology Why: If symptoms worsen, As needed Contact information: [address] [place] [address] [phone]                Discharge Exam: Fredricka Weights   06/23/24 1149  Weight: 82.1 kg    Widespread Mycoid fungoides lesions Condition at discharge: poor  The results of significant diagnostics from this hospitalization (including imaging, microbiology, ancillary and laboratory) are listed below for reference.   Imaging Studies: CT CHEST WO CONTRAST Result Date: 06/27/2024 CLINICAL DATA:  Follow-up of  pulmonary nodules. History of T-cell lymphoma. * Tracking Code: BO * EXAM: CT CHEST WITHOUT CONTRAST TECHNIQUE: Multidetector CT imaging of the chest was performed following the standard protocol without IV contrast. RADIATION DOSE REDUCTION: This exam was performed according to the departmental dose-optimization program which includes automated exposure control, adjustment of the mA and/or kV according to patient size and/or use of iterative reconstruction technique. COMPARISON:  PET of 04/03/2024.  Chest CT of 02/01/2024 FINDINGS: Cardiovascular: Right Port-A-Cath tip  high right atrium. Aortic atherosclerosis. Mild cardiomegaly. Left main and 3 vessel coronary artery calcification. Mediastinum/Nodes: No supraclavicular adenopathy. No mediastinal or hilar adenopathy, given limitations of unenhanced CT. Lungs/Pleura: New small left and trace right pleural effusions. Index posterior left upper lobe nodule measures 2.8 by 2.5 cm on 58/3. Compare 2.0 x 2.1 cm on the prior PET. Anterior right lower lobe lung mass measures 3.3 x 2.9 cm on 88/3 versus 2.2 x 2.1 cm on the prior PET (when remeasured). Right middle lobe 2.7 x 2.5 cm nodule on image 100/3, increased from 1.1 x 1.0 cm on the prior PET (when remeasured). Upper Abdomen: Multiple dependent gallstones. Subcentimeter segment 2 hepatic cyst. Normal imaged portions of the spleen, stomach, pancreas, adrenal glands, kidneys. Artifact degradation secondary to lumbar spine fixation. No upper abdominal adenopathy identified. Musculoskeletal: Posterior left chest wall subcutaneous 2.3 cm lesion on 101/2, likely present on the prior PET. Upper lumbar spine fixation. IMPRESSION: 1. Moderate enlargement of pulmonary nodules since the PET of 04/03/2024, presumably progression of lymphoma. 2. No thoracic adenopathy. 3. New tiny bilateral pleural effusions. 4. Posterior chest wall subcutaneous lesion is likely similar to on the prior PET. Given findings on that exam, likely  related to cutaneous T-cell lymphoma. 5. Incidental findings, including: Coronary artery atherosclerosis. Aortic Atherosclerosis (ICD10-I70.0). Cholelithiasis. Electronically Signed   By: Rockey Kilts M.D.   On: 06/27/2024 18:20   DG Chest Portable 1 View Result Date: 06/23/2024 CLINICAL DATA:  Cough, fever, history of mycosis fungoides / T-cell lymphoma EXAM: PORTABLE CHEST 1 VIEW COMPARISON:  10/28/2023, 04/03/2024 FINDINGS: Single frontal view of the chest demonstrates right chest wall port via internal jugular approach, tip overlying superior vena cava. The cardiac silhouette is unremarkable. There are multiple bilateral pulmonary nodules, largest in the right lower lung zone measuring 3.6 cm increased since prior PET scan. No acute airspace disease, effusion, or pneumothorax. No acute bony abnormalities. IMPRESSION: 1. Enlarging pulmonary nodules consistent with progression of known pulmonary metastases. 2. No acute airspace disease. Electronically Signed   By: Ozell Daring M.D.   On: 06/23/2024 14:59    Microbiology: Results for orders placed or performed during the hospital encounter of 06/23/24  Resp panel by RT-PCR (RSV, Flu A&B, Covid) Anterior Nasal Swab     Status: None   Collection Time: 06/23/24  2:23 PM   Specimen: Anterior Nasal Swab  Result Value Ref Range Status   SARS Coronavirus 2 by RT PCR NEGATIVE NEGATIVE Final    Comment: (NOTE) SARS-CoV-2 target nucleic acids are NOT DETECTED.  The SARS-CoV-2 RNA is generally detectable in upper respiratory specimens during the acute phase of infection. The lowest concentration of SARS-CoV-2 viral copies this assay can detect is 138 copies/mL. A negative result does not preclude SARS-Cov-2 infection and should not be used as the sole basis for treatment or other patient management decisions. A negative result may occur with  improper specimen collection/handling, submission of specimen other than nasopharyngeal swab, presence of  viral mutation(s) within the areas targeted by this assay, and inadequate number of viral copies(<138 copies/mL). A negative result must be combined with clinical observations, patient history, and epidemiological information. The expected result is Negative.  Fact Sheet for Patients:  BloggerCourse.com  Fact Sheet for Healthcare Providers:  SeriousBroker.it  This test is no t yet approved or cleared by the [place]  FDA and  has been authorized for detection and/or diagnosis of SARS-CoV-2 by FDA under an Emergency Use Authorization (EUA). This EUA will remain  in  effect (meaning this test can be used) for the duration of the COVID-19 declaration under Section 564(b)(1) of the Act, 21 U.S.C.section 360bbb-3(b)(1), unless the authorization is terminated  or revoked sooner.       Influenza A by PCR NEGATIVE NEGATIVE Final   Influenza B by PCR NEGATIVE NEGATIVE Final    Comment: (NOTE) The Xpert Xpress SARS-CoV-2/FLU/RSV plus assay is intended as an aid in the diagnosis of influenza from Nasopharyngeal swab specimens and should not be used as a sole basis for treatment. Nasal washings and aspirates are unacceptable for Xpert Xpress SARS-CoV-2/FLU/RSV testing.  Fact Sheet for Patients: BloggerCourse.com  Fact Sheet for Healthcare Providers: SeriousBroker.it  This test is not yet approved or cleared by the [place]  FDA and has been authorized for detection and/or diagnosis of SARS-CoV-2 by FDA under an Emergency Use Authorization (EUA). This EUA will remain in effect (meaning this test can be used) for the duration of the COVID-19 declaration under Section 564(b)(1) of the Act, 21 U.S.C. section 360bbb-3(b)(1), unless the authorization is terminated or revoked.     Resp Syncytial Virus by PCR NEGATIVE NEGATIVE Final    Comment: (NOTE) Fact Sheet for  Patients: BloggerCourse.com  Fact Sheet for Healthcare Providers: SeriousBroker.it  This test is not yet approved or cleared by the [place]  FDA and has been authorized for detection and/or diagnosis of SARS-CoV-2 by FDA under an Emergency Use Authorization (EUA). This EUA will remain in effect (meaning this test can be used) for the duration of the COVID-19 declaration under Section 564(b)(1) of the Act, 21 U.S.C. section 360bbb-3(b)(1), unless the authorization is terminated or revoked.  Performed at [facility], [address] [place], [place] [address]   Blood Culture (routine x 2)     Status: None   Collection Time: 06/23/24  3:24 PM   Specimen: BLOOD  Result Value Ref Range Status   Specimen Description   Final    BLOOD Blood Culture results may not be optimal due to an inadequate volume of blood received in culture bottles   Special Requests   Final    BOTTLES DRAWN AEROBIC AND ANAEROBIC BLOOD LEFT HAND   Culture   Final    NO GROWTH 5 DAYS Performed at [facility], [address] [place], [place] [address]    Report Status 06/28/2024 FINAL  Final  Blood Culture (routine x 2)     Status: None   Collection Time: 06/23/24  3:29 PM   Specimen: BLOOD  Result Value Ref Range Status   Specimen Description   Final    BLOOD Blood Culture results may not be optimal due to an inadequate volume of blood received in culture bottles   Special Requests BOTTLES DRAWN AEROBIC AND ANAEROBIC PORTA CATH  Final   Culture   Final    NO GROWTH 5 DAYS Performed at [facility], [address] [place], [place] [address]    Report Status 06/28/2024 FINAL  Final  Aerobic Culture w Gram Stain (superficial specimen)     Status: None   Collection Time: 06/23/24  4:10 PM   Specimen: Skin, Cyst/Tag/Debridement; Wound  Result Value Ref Range Status   Specimen Description   Final    SKIN Performed at  [facility], [address], [place], [place] [address]    Special Requests   Final    NONE Performed at [facility], [address] [place], [place] [address]    Gram Stain NO WBC  SEEN RARE GRAM POSITIVE RODS   Final   Culture   Final    MODERATE PROTEUS MIRABILIS WITHIN MIXED ORGANISMS Performed at [facility], [address] [place], [place] [address]    Report Status 06/26/2024 FINAL  Final   Organism ID, Bacteria PROTEUS MIRABILIS  Final      Susceptibility   Proteus mirabilis - MIC*    AMPICILLIN <=2 SENSITIVE Sensitive     CEFAZOLIN  (NON-URINE) 4 INTERMEDIATE Intermediate     CEFEPIME  <=0.12 SENSITIVE Sensitive     ERTAPENEM <=0.12 SENSITIVE Sensitive     CEFTRIAXONE  <=0.25 SENSITIVE Sensitive     CIPROFLOXACIN <=0.06 SENSITIVE Sensitive     GENTAMICIN  <=1 SENSITIVE Sensitive     MEROPENEM <=0.25 SENSITIVE Sensitive     TRIMETH/SULFA <=20 SENSITIVE Sensitive     AMPICILLIN/SULBACTAM <=2 SENSITIVE Sensitive     PIP/TAZO Value in next row Sensitive      <=4 SENSITIVEThis is a modified FDA-approved test that has been validated and its performance characteristics determined by the reporting laboratory.  This laboratory is certified under the Clinical Laboratory Improvement Amendments CLIA as qualified to perform high complexity clinical laboratory testing.    * MODERATE PROTEUS MIRABILIS    Labs: CBC: Recent Labs  Lab 06/23/24 1151 06/24/24 0525 06/26/24 0530  WBC 5.7 4.8 5.2  NEUTROABS 4.4  --   --   HGB 12.7* 10.7* 10.7*  HCT 40.9 32.8* 33.3*  MCV 101.5* 95.6 98.8  PLT 262 273 263   Basic Metabolic Panel: Recent Labs  Lab 06/23/24 1151 06/24/24 0525 06/25/24 0941 06/26/24 0530  NA 133* 137  --  138  K 3.7 3.6  --  3.7  CL 98 104  --  106  CO2 22 26  --  22  GLUCOSE 106* 95  --  87  BUN 15 14  --  14  CREATININE 1.02 0.84 0.85 0.76  CALCIUM  8.7* 8.2*  --  8.2*   Liver Function Tests: Recent Labs  Lab 06/23/24 1151  06/24/24 0525  AST 25 18  ALT 15 14  ALKPHOS 50 41  BILITOT 1.1 0.9  PROT 6.3* 5.2*  ALBUMIN 2.9* 2.2*   CBG:   Discharge time spent: greater than 30 minutes.  Signed: Leita Blanch, MD [facility] 06/29/2024

## 2024-06-29 NOTE — Plan of Care (Signed)

## 2024-06-29 NOTE — TOC Transition Note (Signed)
 Transition of Care [facility]) - Discharge Note   Patient Details  Name: Manuel Cook MRN: [id number] Date of Birth: 1946/08/22  [facility] CM/SW Contact:  Corean ONEIDA Haddock, RN Phone Number: 06/29/2024, 1:45 PM   Clinical Narrative:    Patient to discharge today home with hospice services.  [place] with [facility] aware and has arranged DME Per Bedside RN wife to transport at discharge and will have assistance getting patient in the home         Patient Goals and CMS Choice            Discharge Placement                       Discharge Plan and Services Additional resources added to the After Visit Summary for                                       Social Drivers of Health (SDOH) Interventions SDOH Screenings   Food Insecurity: No Food Insecurity (06/23/2024)  Housing: Low Risk  (06/23/2024)  Transportation Needs: No Transportation Needs (06/23/2024)  Utilities: Not At Risk (06/23/2024)  Depression (PHQ2-9): High Risk (06/22/2024)  Financial Resource Strain: Low Risk  (01/18/2024)   Received from [facility]  Social Connections: Moderately Integrated (06/23/2024)  Stress: No Stress Concern Present (06/18/2022)  Tobacco Use: Medium Risk (06/23/2024)  Health Literacy: Adequate Health Literacy (04/20/2024)     Readmission Risk Interventions     No data to display

## 2024-06-30 ENCOUNTER — Inpatient Hospital Stay: Admit: 2024-06-30 | Discharge: 2024-07-01 | Payer: Medicare (Managed Care)

## 2024-07-04 DIAGNOSIS — Z125 Encounter for screening for malignant neoplasm of prostate: Principal | ICD-10-CM

## 2024-07-04 DIAGNOSIS — Z79899 Other long term (current) drug therapy: Principal | ICD-10-CM

## 2024-07-05 ENCOUNTER — Telehealth: Payer: Self-pay | Admitting: Internal Medicine

## 2024-07-05 NOTE — Telephone Encounter (Signed)
 Spoke to patient oncologist at [facility] Dr. Wilkie that the patient is currently in hospice given his progressive pain/performance status of 3-4.  Dr. Jacques kindly agrees to a follow-up visit with the patient regarding their options.  GB  FYI

## 2024-07-06 ENCOUNTER — Encounter: Admit: 2024-07-06 | Payer: Medicare (Managed Care)

## 2024-07-06 DIAGNOSIS — Z79899 Other long term (current) drug therapy: Secondary | ICD-10-CM | POA: Diagnosis not present

## 2024-07-06 DIAGNOSIS — Z125 Encounter for screening for malignant neoplasm of prostate: Secondary | ICD-10-CM | POA: Diagnosis not present

## 2024-07-06 MED ORDER — PREDNISONE 20 MG TABLET
ORAL_TABLET | ORAL | 0 refills | 0.00000 days | Status: CP
Start: 2024-07-06 — End: ?

## 2024-07-14 DIAGNOSIS — Z79899 Other long term (current) drug therapy: Principal | ICD-10-CM

## 2024-07-14 DIAGNOSIS — Z125 Encounter for screening for malignant neoplasm of prostate: Principal | ICD-10-CM

## 2024-07-20 DIAGNOSIS — Z79899 Other long term (current) drug therapy: Principal | ICD-10-CM

## 2024-07-20 DIAGNOSIS — C8400 Mycosis fungoides, unspecified site: Principal | ICD-10-CM

## 2024-07-20 DIAGNOSIS — Z125 Encounter for screening for malignant neoplasm of prostate: Principal | ICD-10-CM

## 2024-07-20 NOTE — Progress Notes (Signed)
 He has felt much better since starting steroids. Currently on 20mg /day.  Edema resolved.  He is stronger and getting up to go to the bathrrom.  Decrease prednisone  to 10mg /day o 07/21/24.  Consider stopping when we see him next week versus tapering down further Will come next week to start bendamustine. But due to age and performanc status will start with dose of 75mg /m2.

## 2024-07-21 DIAGNOSIS — C8400 Mycosis fungoides, unspecified site: Principal | ICD-10-CM

## 2024-07-24 DIAGNOSIS — C8400 Mycosis fungoides, unspecified site: Principal | ICD-10-CM

## 2024-07-26 DIAGNOSIS — C8400 Mycosis fungoides, unspecified site: Principal | ICD-10-CM

## 2024-07-27 ENCOUNTER — Other Ambulatory Visit: Admit: 2024-07-27 | Discharge: 2024-07-28 | Payer: Medicare (Managed Care)

## 2024-07-27 ENCOUNTER — Inpatient Hospital Stay: Admit: 2024-07-27 | Discharge: 2024-07-28 | Payer: Medicare (Managed Care)

## 2024-07-27 ENCOUNTER — Ambulatory Visit: Admit: 2024-07-27 | Discharge: 2024-07-28 | Payer: Medicare (Managed Care)

## 2024-07-27 ENCOUNTER — Ambulatory Visit
Admit: 2024-07-27 | Discharge: 2024-07-28 | Payer: Medicare (Managed Care) | Attending: Hematology & Oncology | Primary: Hematology & Oncology

## 2024-07-27 DIAGNOSIS — C8400 Mycosis fungoides, unspecified site: Principal | ICD-10-CM

## 2024-07-28 NOTE — Telephone Encounter (Signed)
 Encounter opened in error.

## 2024-08-13 ENCOUNTER — Telehealth: Payer: Self-pay | Admitting: Internal Medicine

## 2024-08-13 NOTE — Telephone Encounter (Signed)
 Called patient's wife Manuel Cook to express my condolences-unable to reach left a voicemail.  GB

## 2024-08-28 DEATH — deceased
